# Patient Record
Sex: Female | Born: 1955
Health system: Southern US, Community
[De-identification: ages and names within clinical notes are randomized; demographics above are authoritative.]

## PROBLEM LIST (undated history)

## (undated) DIAGNOSIS — F32A Depression, unspecified: Secondary | ICD-10-CM

## (undated) DIAGNOSIS — K279 Peptic ulcer, site unspecified, unspecified as acute or chronic, without hemorrhage or perforation: Secondary | ICD-10-CM

## (undated) DIAGNOSIS — M21611 Bunion of right foot: Secondary | ICD-10-CM

## (undated) DIAGNOSIS — T7840XA Allergy, unspecified, initial encounter: Secondary | ICD-10-CM

## (undated) DIAGNOSIS — F419 Anxiety disorder, unspecified: Secondary | ICD-10-CM

## (undated) DIAGNOSIS — J45909 Unspecified asthma, uncomplicated: Secondary | ICD-10-CM

## (undated) DIAGNOSIS — E119 Type 2 diabetes mellitus without complications: Secondary | ICD-10-CM

## (undated) DIAGNOSIS — J189 Pneumonia, unspecified organism: Secondary | ICD-10-CM

## (undated) DIAGNOSIS — E785 Hyperlipidemia, unspecified: Secondary | ICD-10-CM

## (undated) DIAGNOSIS — G894 Chronic pain syndrome: Secondary | ICD-10-CM

## (undated) DIAGNOSIS — K219 Gastro-esophageal reflux disease without esophagitis: Secondary | ICD-10-CM

## (undated) DIAGNOSIS — M1990 Unspecified osteoarthritis, unspecified site: Secondary | ICD-10-CM

## (undated) DIAGNOSIS — I10 Essential (primary) hypertension: Secondary | ICD-10-CM

## (undated) DIAGNOSIS — M5412 Radiculopathy, cervical region: Secondary | ICD-10-CM

## (undated) HISTORY — PX: CERVICAL DISC SURGERY: SHX588

## (undated) HISTORY — DX: Chronic pain syndrome: G89.4

## (undated) HISTORY — PX: BREAST BIOPSY: SHX20

## (undated) HISTORY — DX: Radiculopathy, cervical region: M54.12

## (undated) HISTORY — DX: Peptic ulcer, site unspecified, unspecified as acute or chronic, without hemorrhage or perforation: K27.9

## (undated) HISTORY — PX: TUBAL LIGATION: SHX77

## (undated) HISTORY — DX: Hyperlipidemia, unspecified: E78.5

## (undated) HISTORY — DX: Anxiety disorder, unspecified: F41.9

## (undated) HISTORY — PX: HERNIA REPAIR: SHX51

## (undated) HISTORY — DX: Type 2 diabetes mellitus without complications: E11.9

## (undated) HISTORY — DX: Allergy, unspecified, initial encounter: T78.40XA

## (undated) HISTORY — DX: Essential (primary) hypertension: I10

## (undated) HISTORY — PX: BUNIONECTOMY: SHX129

## (undated) HISTORY — PX: COLONOSCOPY: SHX174

## (undated) HISTORY — DX: Depression, unspecified: F32.A

## (undated) HISTORY — PX: APPENDECTOMY: SHX54

## (undated) HISTORY — PX: CHOLECYSTECTOMY: SHX55

## (undated) HISTORY — DX: Gastro-esophageal reflux disease without esophagitis: K21.9

---

## 1978-05-17 HISTORY — PX: DIAGNOSTIC LAPAROSCOPY: SUR761

## 1997-12-24 ENCOUNTER — Other Ambulatory Visit: Admission: RE | Admit: 1997-12-24 | Discharge: 1997-12-24 | Payer: Self-pay | Admitting: Obstetrics & Gynecology

## 1998-01-15 ENCOUNTER — Ambulatory Visit (HOSPITAL_COMMUNITY): Admission: RE | Admit: 1998-01-15 | Discharge: 1998-01-15 | Payer: Self-pay | Admitting: General Surgery

## 1999-07-14 ENCOUNTER — Other Ambulatory Visit: Admission: RE | Admit: 1999-07-14 | Discharge: 1999-07-14 | Payer: Self-pay | Admitting: Obstetrics and Gynecology

## 1999-12-13 ENCOUNTER — Emergency Department (HOSPITAL_COMMUNITY): Admission: EM | Admit: 1999-12-13 | Discharge: 1999-12-13 | Payer: Self-pay | Admitting: Internal Medicine

## 1999-12-13 ENCOUNTER — Encounter: Payer: Self-pay | Admitting: Emergency Medicine

## 1999-12-14 ENCOUNTER — Encounter: Payer: Self-pay | Admitting: General Surgery

## 1999-12-15 ENCOUNTER — Ambulatory Visit (HOSPITAL_COMMUNITY): Admission: RE | Admit: 1999-12-15 | Discharge: 1999-12-16 | Payer: Self-pay | Admitting: General Surgery

## 2006-03-25 ENCOUNTER — Other Ambulatory Visit: Admission: RE | Admit: 2006-03-25 | Discharge: 2006-03-25 | Payer: Self-pay | Admitting: Family Medicine

## 2010-06-07 ENCOUNTER — Encounter: Payer: Self-pay | Admitting: Family Medicine

## 2015-04-03 ENCOUNTER — Other Ambulatory Visit: Payer: Self-pay | Admitting: Obstetrics & Gynecology

## 2015-04-03 DIAGNOSIS — N6489 Other specified disorders of breast: Secondary | ICD-10-CM

## 2015-04-15 ENCOUNTER — Other Ambulatory Visit: Payer: Self-pay | Admitting: Obstetrics & Gynecology

## 2015-04-15 ENCOUNTER — Ambulatory Visit
Admission: RE | Admit: 2015-04-15 | Discharge: 2015-04-15 | Disposition: A | Discharge status: Home / Self Care | Payer: Medicare Other | Source: Ambulatory Visit | Attending: Obstetrics & Gynecology | Admitting: Obstetrics & Gynecology

## 2015-04-15 DIAGNOSIS — N6489 Other specified disorders of breast: Secondary | ICD-10-CM

## 2015-04-15 DIAGNOSIS — N6320 Unspecified lump in the left breast, unspecified quadrant: Secondary | ICD-10-CM

## 2015-04-18 ENCOUNTER — Other Ambulatory Visit: Payer: Self-pay | Admitting: Obstetrics & Gynecology

## 2015-04-18 DIAGNOSIS — N6320 Unspecified lump in the left breast, unspecified quadrant: Secondary | ICD-10-CM

## 2015-04-21 ENCOUNTER — Ambulatory Visit
Admission: RE | Admit: 2015-04-21 | Discharge: 2015-04-21 | Disposition: A | Discharge status: Home / Self Care | Payer: Medicare Other | Source: Ambulatory Visit | Attending: Obstetrics & Gynecology | Admitting: Obstetrics & Gynecology

## 2015-04-21 DIAGNOSIS — N6320 Unspecified lump in the left breast, unspecified quadrant: Secondary | ICD-10-CM

## 2019-10-09 ENCOUNTER — Encounter: Payer: Self-pay | Admitting: Physician Assistant

## 2019-11-01 ENCOUNTER — Ambulatory Visit: Payer: Medicare Other | Admitting: Physician Assistant

## 2019-11-15 ENCOUNTER — Ambulatory Visit: Payer: Medicare Other | Admitting: Physician Assistant

## 2019-12-25 ENCOUNTER — Encounter: Payer: Self-pay | Admitting: Physician Assistant

## 2019-12-25 ENCOUNTER — Ambulatory Visit: Payer: Medicare Other | Admitting: Physician Assistant

## 2019-12-25 ENCOUNTER — Other Ambulatory Visit (INDEPENDENT_AMBULATORY_CARE_PROVIDER_SITE_OTHER): Payer: Medicare Other

## 2019-12-25 VITALS — BP 120/70 | HR 105 | Ht 66.0 in | Wt 235.0 lb

## 2019-12-25 DIAGNOSIS — R1013 Epigastric pain: Secondary | ICD-10-CM

## 2019-12-25 DIAGNOSIS — R197 Diarrhea, unspecified: Secondary | ICD-10-CM | POA: Diagnosis not present

## 2019-12-25 DIAGNOSIS — R1032 Left lower quadrant pain: Secondary | ICD-10-CM

## 2019-12-25 LAB — CBC WITH DIFFERENTIAL/PLATELET
Basophils Absolute: 0.1 10*3/uL (ref 0.0–0.1)
Basophils Relative: 1.3 % (ref 0.0–3.0)
Eosinophils Absolute: 0.2 10*3/uL (ref 0.0–0.7)
Eosinophils Relative: 1.9 % (ref 0.0–5.0)
HCT: 39.6 % (ref 36.0–46.0)
Hemoglobin: 13.2 g/dL (ref 12.0–15.0)
Lymphocytes Relative: 27 % (ref 12.0–46.0)
Lymphs Abs: 3.2 10*3/uL (ref 0.7–4.0)
MCHC: 33.3 g/dL (ref 30.0–36.0)
MCV: 87.1 fl (ref 78.0–100.0)
Monocytes Absolute: 0.6 10*3/uL (ref 0.1–1.0)
Monocytes Relative: 5 % (ref 3.0–12.0)
Neutro Abs: 7.7 10*3/uL (ref 1.4–7.7)
Neutrophils Relative %: 64.8 % (ref 43.0–77.0)
Platelets: 204 10*3/uL (ref 150.0–400.0)
RBC: 4.55 Mil/uL (ref 3.87–5.11)
RDW: 13.9 % (ref 11.5–15.5)
WBC: 11.9 10*3/uL — ABNORMAL HIGH (ref 4.0–10.5)

## 2019-12-25 LAB — COMPREHENSIVE METABOLIC PANEL
ALT: 37 U/L — ABNORMAL HIGH (ref 0–35)
AST: 28 U/L (ref 0–37)
Albumin: 4.4 g/dL (ref 3.5–5.2)
Alkaline Phosphatase: 92 U/L (ref 39–117)
BUN: 18 mg/dL (ref 6–23)
CO2: 30 mEq/L (ref 19–32)
Calcium: 9.6 mg/dL (ref 8.4–10.5)
Chloride: 101 mEq/L (ref 96–112)
Creatinine, Ser: 0.94 mg/dL (ref 0.40–1.20)
GFR: 59.72 mL/min — ABNORMAL LOW (ref >60.00)
Glucose, Bld: 141 mg/dL — ABNORMAL HIGH (ref 70–99)
Potassium: 3.8 mEq/L (ref 3.5–5.1)
Sodium: 139 mEq/L (ref 135–145)
Total Bilirubin: 0.6 mg/dL (ref 0.2–1.2)
Total Protein: 7.9 g/dL (ref 6.0–8.3)

## 2019-12-25 MED ORDER — ESOMEPRAZOLE MAGNESIUM 40 MG PO CPDR: 40.0000 mg | DELAYED_RELEASE_CAPSULE | Freq: Every morning | ORAL | 1 refills | Status: DC

## 2019-12-25 NOTE — Patient Instructions (Addendum)
Your provider has requested that you go to the basement level for lab work before leaving today. Press "B" on the elevator. The lab is located at the first door on the left as you exit the elevator.  Increase your Nexium to 40 mg daily. A new prescription has been sent to your pharmacy.   Change your Carafate timing to 1 hour before or 2 hours after eating or taking medications  You have been scheduled for a CT scan of the abdomen and pelvis at [facility], 1st floor Radiology. You are scheduled on 01/09/20  at 3pm. You should arrive 15 minutes prior to your appointment time for registration. Please follow the written instructions below on the day of your exam:    1) Do not eat anything after 11am (4 hours prior to your test)  2) At least 3 days prior to your procedure you will need to pick up 2 bottles of oral contrast to drink.  The solution may taste better if refrigerated, but do NOT add ice or any other liquid to this solution. Shake well before drinking.   Drink 1 bottle of contrast @ 1:00pm (2 hours prior to your exam)  Drink 1 bottle of contrast @ 2:00pm (1 hour prior to your exam)   You may take any medications as prescribed with a small amount of water, if necessary. If you take any of the following medications: METFORMIN, GLUCOPHAGE, GLUCOVANCE, AVANDAMET, RIOMET, FORTAMET, Antares MET, JANUMET, GLUMETZA or METAGLIP, you MAY be asked to HOLD this medication 48 hours AFTER the exam.   The purpose of you drinking the oral contrast is to aid in the visualization of your intestinal tract. The contrast solution may cause some diarrhea. Depending on your individual set of symptoms, you may also receive an intravenous injection of x-ray contrast/dye. Plan on being at [facility] for 45 minutes or longer, depending on the type of exam you are having performed.   If you have any questions regarding your exam or if you need to reschedule, you may call [facility] at [phone]  between the hours of 8:00 am and 5:00 pm, Monday-Friday.

## 2019-12-25 NOTE — Progress Notes (Signed)
Attending Physician's Attestation   I have reviewed the chart.   I agree with the Advanced Practitioner's note, impression, and recommendations with any updates as below. Agree with further work-up and imaging.  If imaging is unremarkable then endoscopic evaluation likely will be indicated.  If imaging is remarkable for diverticulitis then she will need retreatment and even more reason to plan a repeat endoscopic evaluation.     Justice Britain, MD [facility] Advanced Endoscopy Office # [phone]

## 2019-12-25 NOTE — Progress Notes (Signed)
Chief Complaint: Abdominal pain and diarrhea  HPI:    Amy Garner is a 64 year old Caucasian female with a past medical history as listed below including depression and reflux as well as diabetes type 2, who was referred to me by Timoteo Gaul, FNP for a complaint of abdominal pain and diarrhea.      10/08/2019 patient seen in clinic by her PCP.  At that time was found to have left lower quadrant tenderness and treated for suspected diverticulitis with ciprofloxacin 500 twice daily x7 days and Flagyl 500 twice daily x7 days.  Labs at that time showed a CMP with an elevated glucose of 272, AST elevated 53, ALT 63.  CBC was normal.    Today, the patient tells me that over the past 6 to 8 months she has had a left lower quadrant pain which seems slightly better after having a bowel movement and sometimes just "goes away".  But most of the time it is there all the time and seems to have been getting worse over the past couple of weeks.  Patient does not feel like it changed after using the antibiotics given in May.    Along with this patient describes a change toward looser bowel movements on a more regular basis.  This has also been over the past 6 to 8 months.  Tells me that sometimes it is "yellow and like water" and other times she will have a solid stool in between.  She kept a food journal and could not tell that anything was specifically bothering her.    Also describes some epigastric pain/discomfort which is minimally decreased after being started on Carafate 1 g 3 times daily and Nexium 20 mg once daily by her PCP over the past couple of months.  Denies any overt heartburn or reflux symptoms.    Describes a previous colonoscopy with [facility].  She believes this was in 2015.    Denies fever, chills, blood in her stool, weight loss or symptoms that awaken her from sleep.  Past Medical History:  Diagnosis Date  . Anxiety   . Cervical radiculopathy   . Chronic pain syndrome   . Depression     . GERD (gastroesophageal reflux disease)   . Hyperlipidemia   . Hypertension   . Peptic ulcer   . Type 2 diabetes mellitus ([place])     Past Surgical History:  Procedure Laterality Date  . BUNIONECTOMY    . CERVICAL DISC SURGERY    . CHOLECYSTECTOMY      Current Outpatient Medications  Medication Sig Dispense Refill  . albuterol (VENTOLIN HFA) 108 (90 Base) MCG/ACT inhaler Inhale 2 puffs into the lungs in the morning, at noon, and at bedtime.    . ALPRAZolam (XANAX) 0.5 MG tablet Take 0.5 mg by mouth in the morning, at noon, in the evening, and at bedtime.    . cholecalciferol (VITAMIN D3) 25 MCG (1000 UNIT) tablet Take 1,000 Units by mouth daily.    Marland Kitchen escitalopram (LEXAPRO) 20 MG tablet Take 20 mg by mouth daily.    Marland Kitchen glimepiride (AMARYL) 2 MG tablet Take 2 mg by mouth daily with breakfast.    . insulin detemir (LEVEMIR) 100 UNIT/ML injection Inject 45 Units into the skin daily.    Marland Kitchen losartan (COZAAR) 25 MG tablet Take 25 mg by mouth daily.    . Semaglutide,0.25 or 0.5MG /DOS, (OZEMPIC, 0.25 OR 0.5 MG/DOSE,) 2 MG/1.5ML SOPN Inject 1 mg into the skin once a week.    Marland Kitchen  triamterene-hydrochlorothiazide (DYAZIDE) 37.5-25 MG capsule Take 1 capsule by mouth daily.    Marland Kitchen esomeprazole (NEXIUM) 40 MG capsule Take 1 capsule (40 mg total) by mouth in the morning. 90 capsule 1   No current facility-administered medications for this visit.    Allergies as of 12/25/2019 - Review Complete 12/25/2019  Allergen Reaction Noted  . Atenolol Anaphylaxis 12/19/2019  . Codeine  12/19/2019    Family History  Problem Relation Age of Onset  . Heart attack Mother   . Asthma Mother   . Heart disease Father   . Stomach cancer Paternal Aunt   . Colon cancer Paternal Aunt     Social History   Socioeconomic History  . Marital status: Married    Spouse name: Not on file  . Number of children: Not on file  . Years of education: Not on file  . Highest education level: Not on file  Occupational History   . Not on file  Tobacco Use  . Smoking status: Former Research scientist (life sciences)  . Smokeless tobacco: Never Used  Vaping Use  . Vaping Use: Never used  Substance and Sexual Activity  . Alcohol use: Never  . Drug use: Never  . Sexual activity: Not on file  Other Topics Concern  . Not on file  Social History Narrative  . Not on file   Social Determinants of Health   Financial Resource Strain:   . Difficulty of Paying Living Expenses:   Food Insecurity:   . Worried About Charity fundraiser in the Last Year:   . Arboriculturist in the Last Year:   Transportation Needs:   . Film/video editor (Medical):   Marland Kitchen Lack of Transportation (Non-Medical):   Physical Activity:   . Days of Exercise per Week:   . Minutes of Exercise per Session:   Stress:   . Feeling of Stress :   Social Connections:   . Frequency of Communication with Friends and Family:   . Frequency of Social Gatherings with Friends and Family:   . Attends Religious Services:   . Active Member of Clubs or Organizations:   . Attends Archivist Meetings:   Marland Kitchen Marital Status:   Intimate Partner Violence:   . Fear of Current or Ex-Partner:   . Emotionally Abused:   Marland Kitchen Physically Abused:   . Sexually Abused:     Review of Systems:    Constitutional: No weight loss, fever or chills Skin: No rash  Cardiovascular: No chest pain Respiratory: No SOB  Gastrointestinal: See HPI and otherwise negative Genitourinary: No dysuria  Neurological: No headache, dizziness or syncope Musculoskeletal: No new muscle or joint pain Hematologic: No bleeding  Psychiatric: No history of depression or anxiety   Physical Exam:  Vital signs: BP 120/70   Pulse (!) 105   Ht 5\' 6"  (1.676 m)   Wt 235 lb (106.6 kg)   BMI 37.93 kg/m   Constitutional:   Pleasant overweight Caucasian female appears to be in NAD, Well developed, Well nourished, alert and cooperative Head:  Normocephalic and atraumatic. Eyes:   PEERL, EOMI. No icterus.  Conjunctiva pink. Ears:  Normal auditory acuity. Neck:  Supple Throat: Oral cavity and pharynx without inflammation, swelling or lesion.  Respiratory: Respirations even and unlabored. Lungs clear to auscultation bilaterally.   No wheezes, crackles, or rhonchi.  Cardiovascular: Normal S1, S2. No MRG. Regular rate and rhythm. No peripheral edema, cyanosis or pallor.  Gastrointestinal:  Soft, nondistended, marked left lower quadrant TTP  with involuntary guarding. Normal bowel sounds. No appreciable masses or hepatomegaly. Rectal:  Not performed.  Msk:  Symmetrical without gross deformities. Without edema, no deformity or joint abnormality.  Neurologic:  Alert and  oriented x4;  grossly normal neurologically.  Skin:   Dry and intact without significant lesions or rashes. Psychiatric:  Demonstrates good judgement and reason without abnormal affect or behaviors.  See HPI for recent labs.  Assessment: 1.  Epigastric pain: Some better with Nexium and Carafate; consider gastritis versus other 2.  Chronic left lower quadrant pain: Treated for diverticulitis by her PCP in May but no change in symptoms, better after a bowel movement; consider relationship to constipation versus continued diverticulitis versus other 3.  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  Repeat CBC and CMP today. 2.  Ordered a CT of the abdomen pelvis with contrast for further evaluation of continued left lower quadrant pain as well as epigastric pain.  Pending results patient may need EGD/colonoscopy. 3.  Increased patient's Nexium to 40 mg once daily, 30-60 minutes before breakfast.  She asked that this was sent to [facility].  Prescribe #90 with 1 refill. 4.  Discussed proper timing of using Carafate.  Told her this needs to be an hour before or 2 hours after eating and other medications. 5.  We will request records from [facility] in  regards to patient's last colonoscopy. 6.  Patient to schedule a follow-up with me in 4 to 6 weeks.  At that time we will discuss the CT and labs and decide if she needs to had endoscopic procedures.  Assigned to Dr. Rush Landmark today.  Ellouise Newer, PA-C [facility] 12/25/2019, 3:45 PM  Cc: Timoteo Gaul, FNP

## 2020-01-09 ENCOUNTER — Other Ambulatory Visit: Payer: Self-pay

## 2020-01-09 ENCOUNTER — Ambulatory Visit (HOSPITAL_COMMUNITY)
Admission: RE | Admit: 2020-01-09 | Discharge: 2020-01-09 | Disposition: A | Discharge status: Home / Self Care | Payer: Medicare Other | Source: Ambulatory Visit | Attending: Gastroenterology | Admitting: Gastroenterology

## 2020-01-09 DIAGNOSIS — K4090 Unilateral inguinal hernia, without obstruction or gangrene, not specified as recurrent: Secondary | ICD-10-CM | POA: Diagnosis not present

## 2020-01-09 DIAGNOSIS — I700 Atherosclerosis of aorta: Secondary | ICD-10-CM | POA: Diagnosis not present

## 2020-01-09 DIAGNOSIS — R197 Diarrhea, unspecified: Secondary | ICD-10-CM | POA: Diagnosis present

## 2020-01-09 DIAGNOSIS — K760 Fatty (change of) liver, not elsewhere classified: Secondary | ICD-10-CM | POA: Diagnosis not present

## 2020-01-09 MED ORDER — SODIUM CHLORIDE (PF) 0.9 % IJ SOLN
INTRAMUSCULAR | Status: AC
  Filled 2020-01-09: qty 50

## 2020-01-09 MED ORDER — IOHEXOL 300 MG/ML  SOLN
100.0000 mL | Freq: Once | INTRAMUSCULAR | Status: AC | PRN
  Administered 2020-01-09: 100 mL via INTRAVENOUS

## 2020-01-11 ENCOUNTER — Other Ambulatory Visit: Payer: Self-pay

## 2020-01-11 DIAGNOSIS — R7989 Other specified abnormal findings of blood chemistry: Secondary | ICD-10-CM

## 2020-01-15 ENCOUNTER — Other Ambulatory Visit: Payer: Self-pay

## 2020-01-15 DIAGNOSIS — R7989 Other specified abnormal findings of blood chemistry: Secondary | ICD-10-CM

## 2020-02-05 ENCOUNTER — Encounter: Payer: Self-pay | Admitting: Physician Assistant

## 2020-02-05 ENCOUNTER — Ambulatory Visit (INDEPENDENT_AMBULATORY_CARE_PROVIDER_SITE_OTHER): Payer: Medicare Other | Admitting: Physician Assistant

## 2020-02-05 VITALS — BP 124/72 | HR 84 | Ht 66.0 in | Wt 224.0 lb

## 2020-02-05 DIAGNOSIS — R109 Unspecified abdominal pain: Secondary | ICD-10-CM | POA: Diagnosis not present

## 2020-02-05 DIAGNOSIS — K4090 Unilateral inguinal hernia, without obstruction or gangrene, not specified as recurrent: Secondary | ICD-10-CM

## 2020-02-05 DIAGNOSIS — R1032 Left lower quadrant pain: Secondary | ICD-10-CM

## 2020-02-05 DIAGNOSIS — K219 Gastro-esophageal reflux disease without esophagitis: Secondary | ICD-10-CM | POA: Diagnosis not present

## 2020-02-05 NOTE — Progress Notes (Signed)
Attending Physician's Attestation   I have reviewed the chart.   I agree with the Advanced Practitioner's note, impression, and recommendations with any updates as below. I am glad to hear that she is doing better from the upper GI perspective.  I still think with her symptoms at some point in time anyone over the age of 57 who has new upper GI symptoms even if they respond to PPI therapy should undergo an endoscopic evaluation so we should keep that in the back of our minds but certainly can hold on that for now.  Agree colonoscopy should be considered although patient wants to wait on this and it seems based on the clinical examination that inguinal hernia seems to be more of an issue so certainly if the surgical team wants to move forward with conservative interventions to that first prior to colonoscopy we can hold on colonoscopy or we can certainly try to be available as necessary.  Endoscopic evaluation should not be held for ever but certainly based on patient's current acute issues can defer on them for a bit of time.   Justice Britain, MD [facility] Advanced Endoscopy Office # [phone]

## 2020-02-05 NOTE — Progress Notes (Signed)
Chief Complaint: Follow-up abdominal pain and diarrhea  HPI:    Amy Garner is a 64 year old Caucasian female with a past medical history as listed below including depression and reflux as well as type 2 diabetes, assigned to Dr. Rush Landmark at last visit, who presents clinic today for follow-up of abdominal pain and diarrhea.    10/08/2019 patient seen by PCP was suspected diverticulitis and treated with Cipro and Flagyl x7 days.  Also had elevated liver enzymes at that time including AST 53 and ALT is 63.    12/25/2019 patient saw me in clinic and described continuing with left lower quadrant pain over the past 6 to 8 months.  Also describes a change toward looser bowel movements on a more regular basis.  Also discussed the epigastric pain which is minimally decreased after being started on Carafate and Nexium.  At that time ordered a repeat CBC and CMP as well as a CT of the abdomen pelvis.  Discussed that if she continues with lower quadrant pain and epigastric pain then would need an EGD/colonoscopy.  Increase Nexium to 40 mg once daily.  Also discussed proper timing of Carafate.    12/25/2019 ALT is 37.    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  CT was reviewed by Dr. Rush Landmark and patient was sent a referral to urology as well as referral to surgery to be worked up for inguinal hernia.  Repeat LFTs recommended in 1 month.    Today, the patient tells me that her epigastric pain is almost completely relieved with the increase of Nexium to 40 mg once daily.  Occasionally she has to use the Pepcid on top of this for some reflux symptoms.    Continues with a chronic left lower quadrant abdominal pain which is worse when she  is standing for a long time.  She now believes this is related to the hernia.  Apparently she had a right inguinal hernia repaired years ago.  She has not received referral to [facility] yet.    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    Denies fever, chills, weight loss or symptoms that awaken her from sleep.  Past Medical History:  Diagnosis Date  . Anxiety   . Cervical radiculopathy   . Chronic pain syndrome   . Depression   . GERD (gastroesophageal reflux disease)   . Hyperlipidemia   . Hypertension   . Peptic ulcer   . Type 2 diabetes mellitus ([place])     Past Surgical History:  Procedure Laterality Date  . BUNIONECTOMY    . CERVICAL DISC SURGERY    . CHOLECYSTECTOMY      Current Outpatient Medications  Medication Sig Dispense Refill  . albuterol (VENTOLIN HFA) 108 (90 Base) MCG/ACT inhaler Inhale 2 puffs into the lungs in the morning, at noon, and at bedtime.    . ALPRAZolam (XANAX) 0.5 MG tablet Take 0.5 mg by mouth in the morning, at noon, in the evening, and at bedtime.    . cholecalciferol (VITAMIN D3) 25 MCG (1000 UNIT) tablet Take 1,000 Units by mouth daily.    Marland Kitchen escitalopram (LEXAPRO) 20 MG tablet Take 20 mg by mouth daily.    Marland Kitchen  esomeprazole (NEXIUM) 40 MG capsule Take 1 capsule (40 mg total) by mouth in the morning. 90 capsule 1  . glimepiride (AMARYL) 2 MG tablet Take 2 mg by mouth daily with breakfast.    . insulin detemir (LEVEMIR) 100 UNIT/ML injection Inject 45 Units into the skin daily.    Marland Kitchen losartan (COZAAR) 25 MG tablet Take 25 mg by mouth daily.    . Semaglutide,0.25 or 0.5MG /DOS, (OZEMPIC, 0.25 OR 0.5 MG/DOSE,) 2 MG/1.5ML SOPN Inject 1 mg into the skin once a week.    . triamterene-hydrochlorothiazide (DYAZIDE) 37.5-25 MG capsule Take 1 capsule by mouth daily.     No current facility-administered medications for this visit.    Allergies as of 02/05/2020 -  Review Complete 12/25/2019  Allergen Reaction Noted  . Atenolol Anaphylaxis 12/19/2019  . Codeine  12/19/2019    Family History  Problem Relation Age of Onset  . Heart attack Mother   . Asthma Mother   . Heart disease Father   . Stomach cancer Paternal Aunt   . Colon cancer Paternal Aunt     Social History   Socioeconomic History  . Marital status: Married    Spouse name: Not on file  . Number of children: Not on file  . Years of education: Not on file  . Highest education level: Not on file  Occupational History  . Not on file  Tobacco Use  . Smoking status: Former Research scientist (life sciences)  . Smokeless tobacco: Never Used  Vaping Use  . Vaping Use: Never used  Substance and Sexual Activity  . Alcohol use: Never  . Drug use: Never  . Sexual activity: Not on file  Other Topics Concern  . Not on file  Social History Narrative  . Not on file   Social Determinants of Health   Financial Resource Strain:   . Difficulty of Paying Living Expenses: Not on file  Food Insecurity:   . Worried About Charity fundraiser in the Last Year: Not on file  . Ran Out of Food in the Last Year: Not on file  Transportation Needs:   . Lack of Transportation (Medical): Not on file  . Lack of Transportation (Non-Medical): Not on file  Physical Activity:   . Days of Exercise per Week: Not on file  . Minutes of Exercise per Session: Not on file  Stress:   . Feeling of Stress : Not on file  Social Connections:   . Frequency of Communication with Friends and Family: Not on file  . Frequency of Social Gatherings with Friends and Family: Not on file  . Attends Religious Services: Not on file  . Active Member of Clubs or Organizations: Not on file  . Attends Archivist Meetings: Not on file  . Marital Status: Not on file  Intimate Partner Violence:   . Fear of Current or Ex-Partner: Not on file  . Emotionally Abused: Not on file  . Physically Abused: Not on file  . Sexually Abused: Not on file     Review of Systems:    Constitutional: No weight loss, fever or chills Cardiovascular: No chest pain Respiratory: No SOB  Gastrointestinal: See HPI and otherwise negative   Physical Exam:  Vital signs: BP 124/72   Pulse 84   Ht 5\' 6"  (1.676 m)   Wt 224 lb (101.6 kg)   BMI 36.15 kg/m   Constitutional:   Pleasant overweight Caucasian female appears to be in NAD, Well developed, Well nourished, alert and cooperative Respiratory:  Respirations even and unlabored. Lungs clear to auscultation bilaterally.   No wheezes, crackles, or rhonchi.  Cardiovascular: Normal S1, S2. No MRG. Regular rate and rhythm. No peripheral edema, cyanosis or pallor.  Gastrointestinal:  Soft, nondistended, moderate LLQ ttp. No rebound or guarding. Normal bowel sounds. No appreciable masses or hepatomegaly. Rectal:  Not performed.  Psychiatric: Demonstrates good judgement and reason without abnormal affect or behaviors.  RELEVANT LABS AND IMAGING: CBC    Component Value Date/Time   WBC 11.9 (H) 12/25/2019 1559   RBC 4.55 12/25/2019 1559   HGB 13.2 12/25/2019 1559   HCT 39.6 12/25/2019 1559   PLT 204.0 12/25/2019 1559   MCV 87.1 12/25/2019 1559   MCHC 33.3 12/25/2019 1559   RDW 13.9 12/25/2019 1559   LYMPHSABS 3.2 12/25/2019 1559   MONOABS 0.6 12/25/2019 1559   EOSABS 0.2 12/25/2019 1559   BASOSABS 0.1 12/25/2019 1559    CMP     Component Value Date/Time   NA 139 12/25/2019 1559   K 3.8 12/25/2019 1559   CL 101 12/25/2019 1559   CO2 30 12/25/2019 1559   GLUCOSE 141 (H) 12/25/2019 1559   BUN 18 12/25/2019 1559   CREATININE 0.94 12/25/2019 1559   CALCIUM 9.6 12/25/2019 1559   PROT 7.9 12/25/2019 1559   ALBUMIN 4.4 12/25/2019 1559   AST 28 12/25/2019 1559   ALT 37 (H) 12/25/2019 1559   ALKPHOS 92 12/25/2019 1559   BILITOT 0.6 12/25/2019 1559    Assessment: 1.  Chronic left lower quadrant pain: Likely related to inguinal hernia seen on CT 2.  GERD/epigastric pain: Much better with Nexium  40 mg daily; likely related to gastritis 3.  Elevated LFTs: ALT has remained minimally elevated, will recheck today  Plan: 1.  Referred patient to [facility].  Discussed that likely they will want to intervene soon as she is now having pain in this area. 2.  Patient would like to hold off on EGD and colonoscopy for now.  This is reasonable given that her symptoms are much better and left lower quadrant pain is explained by inguinal hernia. 3.  Continue Nexium 40 mg daily 4.  Patient is already scheduled for repeat LFTs today.  If ALT is still elevated will add other labs per Dr. Donneta Romberg recommendations. 5.  Patient to follow in clinic with [place] as needed after seeing surgical team.  Ellouise Newer, PA-C [facility] 02/05/2020, 11:19 AM  Cc: Timoteo Gaul, FNP

## 2020-02-05 NOTE — Patient Instructions (Signed)
Referral has been sent to  [facility]. Please contact their office if you have not heard from them in 1 week.  Make certain to bring a list of current medications, including any over the counter medications or vitamins. Also bring your co-pay if you have one as well as your insurance cards. [facility] is located at [address]. Should you need to reschedule your appointment, please contact them at [phone].    If you are age 25 or older, your body mass index should be between 23-30. Your Body mass index is 36.15 kg/m. If this is out of the aforementioned range listed, please consider follow up with your Primary Care Provider.  If you are age 38 or younger, your body mass index should be between 19-25. Your Body mass index is 36.15 kg/m. If this is out of the aformentioned range listed, please consider follow up with your Primary Care Provider.    Thank you for choosing me and [facility].  Dennison Bulla

## 2020-03-27 ENCOUNTER — Ambulatory Visit: Payer: Self-pay | Admitting: Surgery

## 2020-03-27 NOTE — H&P (Signed)
Surgical Evaluation   HPI: this is a very pleasant 64 year old woman who presents with a left inguinal hernia when she first noticed in May.  She experiences burning pain and noted a bulge in the area.  This is reducible.  Denies associated GI symptoms.  She had a right inguinal hernia repair by Dr. Rebekah Chesterfield many years ago, denies any other abdominal surgery.   Allergies  Allergen Reactions  . Atenolol Anaphylaxis  . Codeine     Past Medical History:  Diagnosis Date  . Anxiety   . Cervical radiculopathy   . Chronic pain syndrome   . Depression   . GERD (gastroesophageal reflux disease)   . Hyperlipidemia   . Hypertension   . Peptic ulcer   . Type 2 diabetes mellitus ([place])     Past Surgical History:  Procedure Laterality Date  . BUNIONECTOMY    . CERVICAL DISC SURGERY    . CHOLECYSTECTOMY      Family History  Problem Relation Age of Onset  . Heart attack Mother   . Asthma Mother   . Heart disease Father   . Stomach cancer Paternal Aunt   . Colon cancer Paternal Aunt     Social History   Socioeconomic History  . Marital status: Married    Spouse name: Not on file  . Number of children: Not on file  . Years of education: Not on file  . Highest education level: Not on file  Occupational History  . Not on file  Tobacco Use  . Smoking status: Former Research scientist (life sciences)  . Smokeless tobacco: Never Used  Vaping Use  . Vaping Use: Never used  Substance and Sexual Activity  . Alcohol use: Never  . Drug use: Never  . Sexual activity: Not on file  Other Topics Concern  . Not on file  Social History Narrative  . Not on file   Social Determinants of Health   Financial Resource Strain:   . Difficulty of Paying Living Expenses: Not on file  Food Insecurity:   . Worried About Charity fundraiser in the Last Year: Not on file  . Ran Out of Food in the Last Year: Not on file  Transportation Needs:   . Lack of Transportation (Medical): Not on file  . Lack of Transportation  (Non-Medical): Not on file  Physical Activity:   . Days of Exercise per Week: Not on file  . Minutes of Exercise per Session: Not on file  Stress:   . Feeling of Stress : Not on file  Social Connections:   . Frequency of Communication with Friends and Family: Not on file  . Frequency of Social Gatherings with Friends and Family: Not on file  . Attends Religious Services: Not on file  . Active Member of Clubs or Organizations: Not on file  . Attends Archivist Meetings: Not on file  . Marital Status: Not on file    Current Outpatient Medications on File Prior to Visit  Medication Sig Dispense Refill  . albuterol (VENTOLIN HFA) 108 (90 Base) MCG/ACT inhaler Inhale 2 puffs into the lungs in the morning, at noon, and at bedtime.    . ALPRAZolam (XANAX) 0.5 MG tablet Take 0.5 mg by mouth in the morning, at noon, in the evening, and at bedtime.    . cholecalciferol (VITAMIN D3) 25 MCG (1000 UNIT) tablet Take 1,000 Units by mouth daily.    Marland Kitchen escitalopram (LEXAPRO) 20 MG tablet Take 20 mg by mouth daily.    Marland Kitchen  esomeprazole (NEXIUM) 40 MG capsule Take 1 capsule (40 mg total) by mouth in the morning. 90 capsule 1  . glimepiride (AMARYL) 2 MG tablet Take 2 mg by mouth daily with breakfast.    . insulin detemir (LEVEMIR) 100 UNIT/ML injection Inject 45 Units into the skin daily.    Marland Kitchen losartan (COZAAR) 25 MG tablet Take 25 mg by mouth daily.    . Semaglutide,0.25 or 0.5MG /DOS, (OZEMPIC, 0.25 OR 0.5 MG/DOSE,) 2 MG/1.5ML SOPN Inject 1 mg into the skin once a week.    . triamterene-hydrochlorothiazide (DYAZIDE) 37.5-25 MG capsule Take 1 capsule by mouth daily.     No current facility-administered medications on file prior to visit.    Review of Systems: a complete, 10pt review of systems was completed with pertinent positives and negatives as documented in the HPI  Physical Exam: Vitals  Weight: 229 lb Height: 66in Body Surface Area: 2.12 m Body Mass Index: 36.96 kg/m  Temp.:  98.37F  Pulse: 85 (Regular)  P.OX: 97% (Room air) BP: 124/76(Sitting, Left Arm, Standard)  Alert well-appearing, unlabored respirations Abdomen is obese, nontender. There is reducible left hernia, no palpable hernia on the right, well-healed scar   CBC Latest Ref Rng & Units 12/25/2019  WBC 4.0 - 10.5 K/uL 11.9(H)  Hemoglobin 12.0 - 15.0 g/dL 13.2  Hematocrit 36 - 46 % 39.6  Platelets 150 - 400 K/uL 204.0    CMP Latest Ref Rng & Units 12/25/2019  Glucose 70 - 99 mg/dL 141(H)  BUN 6 - 23 mg/dL 18  Creatinine 0.40 - 1.20 mg/dL 0.94  Sodium 135 - 145 mEq/L 139  Potassium 3.5 - 5.1 mEq/L 3.8  Chloride 96 - 112 mEq/L 101  CO2 19 - 32 mEq/L 30  Calcium 8.4 - 10.5 mg/dL 9.6  Total Protein 6.0 - 8.3 g/dL 7.9  Total Bilirubin 0.2 - 1.2 mg/dL 0.6  Alkaline Phos 39 - 117 U/L 92  AST 0 - 37 U/L 28  ALT 0 - 35 U/L 37(H)    No results found for: INR, PROTIME  Imaging: No results found.   A/P: LEFT INGUINAL HERNIA (K40.90) Story: We discussed the relevant anatomy and we discussed the technique of the procedure, going over both the open and laparoscopic/robotic approach. Discussed risks of bleeding, infection, pain, scarring, injury to structures in the area including nerves, blood vessels, bowel, bladder, risk of chronic pain, hernia recurrence, risk of seroma or hematoma, urinary retention, and risks of general anesthesia including cardiovascular, pulmonary, and thromboembolic complications. Questions were answered. In this unilateral nonrecurrent hernia I recommend an open approach and she wishes for the same. We will schedule at her convenience.    There are no problems to display for this patient.      Romana Juniper, MD [facility] Surgery, [place]  See AMION to contact appropriate on-call provider

## 2020-04-24 NOTE — Patient Instructions (Addendum)
DUE TO COVID-19 ONLY ONE VISITOR IS ALLOWED TO COME WITH YOU AND STAY IN THE WAITING ROOM ONLY DURING PRE OP AND PROCEDURE DAY OF SURGERY. THE 1 VISITOR  MAY VISIT WITH YOU AFTER SURGERY IN YOUR PRIVATE ROOM DURING VISITING HOURS ONLY!  YOU NEED TO HAVE A COVID 19 TEST ON__12/14_____ @_9 :00______, THIS TEST MUST BE DONE BEFORE SURGERY,  COVID TESTING SITE [place] [address], IT IS ON THE RIGHT GOING OUT [address] APPROXIMATELY  2 MINUTES PAST [employer] ON THE RIGHT. ONCE YOUR COVID TEST IS COMPLETED,  PLEASE BEGIN THE QUARANTINE INSTRUCTIONS AS OUTLINED IN YOUR HANDOUT.                Amy Garner    Your procedure is scheduled on: 05/02/20   Report to [facility] Main  Entrance   Report to admitting at 8:00 AM     Call this number if you have problems the morning of surgery [phone]    Remember: Do not eat food or drink liquids :After Midnight.   BRUSH YOUR TEETH MORNING OF SURGERY AND RINSE YOUR MOUTH OUT, NO CHEWING GUM CANDY OR MINTS.     Take these medicines the morning of surgery with A SIP OF WATER: Lexapro, Nexium,  use your inhaler and bring it with you      How to Manage Your Diabetes Before and After Surgery  Why is it important to control my blood sugar before and after surgery? . Improving blood sugar levels before and after surgery helps healing and can limit problems. . A way of improving blood sugar control is eating a healthy diet by: o  Eating less sugar and carbohydrates o  Increasing activity/exercise o  Talking with your doctor about reaching your blood sugar goals . High blood sugars (greater than 180 mg/dL) can raise your risk of infections and slow your recovery, so you will need to focus on controlling your diabetes during the weeks before surgery. . Make sure that the doctor who takes care of your diabetes knows about your planned surgery including the date and location.  How do I manage my  blood sugar before surgery? . Check your blood sugar at least 4 times a day, starting 2 days before surgery, to make sure that the level is not too high or low. o Check your blood sugar the morning of your surgery when you wake up and every 2 hours until you get to the Short Stay unit. . If your blood sugar is less than 70 mg/dL, you will need to treat for low blood sugar: o Do not take insulin. o Treat a low blood sugar (less than 70 mg/dL) with  cup of clear juice (cranberry or apple), 4 glucose tablets, OR glucose gel. o Recheck blood sugar in 15 minutes after treatment (to make sure it is greater than 70 mg/dL). If your blood sugar is not greater than 70 mg/dL on recheck, call [phone] for further instructions. . Report your blood sugar to the short stay nurse when you get to Short Stay.  . If you are admitted to the hospital after surgery: o Your blood sugar will be checked by the staff and you will probably be given insulin after surgery (instead of oral diabetes medicines) to make sure you have good blood sugar levels. o The goal for blood sugar control after surgery is 80-180 mg/dL.   WHAT DO I DO ABOUT MY DIABETES MEDICATION?  Marland Kitchen Do  not take oral diabetes medicines (pills) the morning of surgery.  . THE NIGHT BEFORE SURGERY, take   0  units of       insulin.       . THE MORNING OF SURGERY, take  22 units of   insulin.  . The day of surgery, do not take other diabetes injectables, including Byetta (exenatide), Bydureon (exenatide ER), Victoza (liraglutide), or Trulicity (dulaglutide).                        You may not have any metal on your body including hair pins and              piercings  Do not wear jewelry, make-up, lotions, powders or perfumes, deodorant             Do not wear nail polish on your fingernails.  Do not shave  48 hours prior to surgery.            .   Do not bring valuables to the hospital. [place].  Contacts, dentures or bridgework may not be worn into surgery.       Patients discharged the day of surgery will not be allowed to drive home.   IF YOU ARE HAVING SURGERY AND GOING HOME THE SAME DAY, YOU MUST HAVE AN ADULT TO DRIVE YOU HOME AND BE WITH YOU FOR 24 HOURS.   YOU MAY GO HOME BY TAXI OR UBER OR ORTHERWISE, BUT AN ADULT MUST ACCOMPANY YOU HOME AND STAY WITH YOU FOR 24 HOURS.  Name and phone number of your driver:  Special Instructions: N/A              Please read over the following fact sheets you were given: _____________________________________________________________________             [facility] - Preparing for Surgery Before surgery, you can play an important role.   Because skin is not sterile, your skin needs to be as free of germs as possible .  You can reduce the number of germs on your skin by washing with CHG (chlorahexidine gluconate) soap before surgery.   CHG is an antiseptic cleaner which kills germs and bonds with the skin to continue killing germs even after washing. Please DO NOT use if you have an allergy to CHG or antibacterial soaps.   If your skin becomes reddened/irritated stop using the CHG and inform your nurse when you arrive at Short Stay. Do not shave (including legs and underarms) for at least 48 hours prior to the first CHG shower.  Please follow these instructions carefully:  1.  Shower with CHG Soap the night before surgery and the  morning of Surgery.  2.  If you choose to wash your hair, wash your hair first as usual with your  normal  shampoo.  3.  After you shampoo, rinse your hair and body thoroughly to remove the  shampoo.                                        4.  Use CHG as you would any other liquid soap.  You can apply chg directly  to the skin and wash  Gently with a scrungie or clean washcloth.  5.  Apply the CHG Soap to your body ONLY FROM THE NECK DOWN.   Do not use on face/ open                            Wound or open sores. Avoid contact with eyes, ears mouth and genitals (private parts).                       Wash face,  Genitals (private parts) with your normal soap.             6.  Wash thoroughly, paying special attention to the area where your surgery  will be performed.  7.  Thoroughly rinse your body with warm water from the neck down.  8.  DO NOT shower/wash with your normal soap after using and rinsing off  the CHG Soap.             9.  Pat yourself dry with a clean towel.            10.  Wear clean pajamas.            11.  Place clean sheets on your bed the night of your first shower and do not  sleep with pets. Day of Surgery : Do not apply any lotions/deodorants the morning of surgery.  Please wear clean clothes to the hospital/surgery center.  FAILURE TO FOLLOW THESE INSTRUCTIONS MAY RESULT IN THE CANCELLATION OF YOUR SURGERY PATIENT SIGNATURE_________________________________  NURSE SIGNATURE__________________________________  ________________________________________________________________________

## 2020-04-25 ENCOUNTER — Encounter (HOSPITAL_COMMUNITY)
Admission: RE | Admit: 2020-04-25 | Discharge: 2020-04-25 | Disposition: A | Discharge status: Home / Self Care | Payer: Medicare Other | Source: Ambulatory Visit | Attending: Surgery | Admitting: Surgery

## 2020-04-25 ENCOUNTER — Other Ambulatory Visit: Payer: Self-pay

## 2020-04-25 ENCOUNTER — Encounter (HOSPITAL_COMMUNITY): Payer: Self-pay

## 2020-04-25 DIAGNOSIS — Z01818 Encounter for other preprocedural examination: Secondary | ICD-10-CM | POA: Diagnosis present

## 2020-04-25 HISTORY — DX: Unspecified asthma, uncomplicated: J45.909

## 2020-04-25 LAB — BASIC METABOLIC PANEL
Anion gap: 5 (ref 5–15)
BUN: 22 mg/dL (ref 8–23)
CO2: 34 mmol/L — ABNORMAL HIGH (ref 22–32)
Calcium: 9.7 mg/dL (ref 8.9–10.3)
Chloride: 99 mmol/L (ref 98–111)
Creatinine, Ser: 0.74 mg/dL (ref 0.44–1.00)
GFR, Estimated: >60 mL/min (ref >60)
Glucose, Bld: 117 mg/dL — ABNORMAL HIGH (ref 70–99)
Potassium: 4.7 mmol/L (ref 3.5–5.1)
Sodium: 138 mmol/L (ref 135–145)

## 2020-04-25 LAB — CBC
HCT: 40.3 % (ref 36.0–46.0)
Hemoglobin: 12.9 g/dL (ref 12.0–15.0)
MCH: 28.7 pg (ref 26.0–34.0)
MCHC: 32 g/dL (ref 30.0–36.0)
MCV: 89.6 fL (ref 80.0–100.0)
Platelets: 198 10*3/uL (ref 150–400)
RBC: 4.5 MIL/uL (ref 3.87–5.11)
RDW: 13.2 % (ref 11.5–15.5)
WBC: 9.7 10*3/uL (ref 4.0–10.5)
nRBC: 0 % (ref 0.0–0.2)

## 2020-04-25 LAB — HEMOGLOBIN A1C
Hgb A1c MFr Bld: 6.4 % — ABNORMAL HIGH (ref 4.8–5.6)
Mean Plasma Glucose: 136.98 mg/dL

## 2020-04-25 LAB — GLUCOSE, CAPILLARY: Glucose-Capillary: 104 mg/dL — ABNORMAL HIGH (ref 70–99)

## 2020-04-25 NOTE — Progress Notes (Addendum)
COVID Vaccine Completed:Yes Date COVID Vaccine completed:01/15/20 Booster- 03/19/20 COVID vaccine manufacturer: [place]       PCP - Nile Riggs FNP Cardiologist - none  Chest x-ray - no EKG - 04/25/20-chart and Epic Stress Test - no ECHO - no Cardiac Cath - no Pacemaker/ICD device last checked:NA  Sleep Study - no CPAP -   Fasting Blood Sugar - 112-121 Checks Blood Sugar _QD____ times a day  Blood Thinner Instructions:NA Aspirin Instructions: Last Dose:  Anesthesia review:   Patient denies shortness of breath, fever, cough and chest pain at PAT appointment yes   Patient verbalized understanding of instructions that were given to them at the PAT appointment. Patient was also instructed that they will need to review over the PAT instructions again at home before surgery. Yes Pt gets SOB when she is ill but usually doesn't need her inhaler. She has no SOB doing housework or with ADLs Pt is unable to extend her neck

## 2020-04-29 ENCOUNTER — Other Ambulatory Visit (HOSPITAL_COMMUNITY)
Admission: RE | Admit: 2020-04-29 | Discharge: 2020-04-29 | Disposition: A | Discharge status: Home / Self Care | Payer: Medicare Other | Source: Ambulatory Visit | Attending: Surgery | Admitting: Surgery

## 2020-04-29 DIAGNOSIS — Z20822 Contact with and (suspected) exposure to covid-19: Secondary | ICD-10-CM | POA: Diagnosis not present

## 2020-04-29 DIAGNOSIS — Z01812 Encounter for preprocedural laboratory examination: Secondary | ICD-10-CM | POA: Insufficient documentation

## 2020-04-29 LAB — SARS CORONAVIRUS 2 (TAT 6-24 HRS): SARS Coronavirus 2: NEGATIVE

## 2020-05-01 MED ORDER — BUPIVACAINE LIPOSOME 1.3 % IJ SUSP
20.0000 mL | INTRAMUSCULAR | Status: DC
  Filled 2020-05-01: qty 20

## 2020-05-02 ENCOUNTER — Other Ambulatory Visit (HOSPITAL_COMMUNITY): Payer: Self-pay | Admitting: Surgery

## 2020-05-02 ENCOUNTER — Ambulatory Visit (HOSPITAL_COMMUNITY)
Admission: RE | Admit: 2020-05-02 | Discharge: 2020-05-02 | Disposition: A | Discharge status: Home / Self Care | Payer: Medicare Other | Source: Other Acute Inpatient Hospital | Attending: Surgery | Admitting: Surgery

## 2020-05-02 ENCOUNTER — Encounter (HOSPITAL_COMMUNITY)
Admission: RE | Disposition: A | Discharge status: Home / Self Care | Payer: Self-pay | Source: Other Acute Inpatient Hospital | Attending: Surgery

## 2020-05-02 ENCOUNTER — Telehealth (HOSPITAL_COMMUNITY): Payer: Self-pay | Admitting: *Deleted

## 2020-05-02 ENCOUNTER — Ambulatory Visit (HOSPITAL_COMMUNITY): Payer: Medicare Other | Admitting: Certified Registered"

## 2020-05-02 ENCOUNTER — Ambulatory Visit (HOSPITAL_COMMUNITY): Payer: Medicare Other | Admitting: Physician Assistant

## 2020-05-02 ENCOUNTER — Encounter (HOSPITAL_COMMUNITY): Payer: Self-pay | Admitting: Surgery

## 2020-05-02 DIAGNOSIS — Z79899 Other long term (current) drug therapy: Secondary | ICD-10-CM | POA: Insufficient documentation

## 2020-05-02 DIAGNOSIS — Z87891 Personal history of nicotine dependence: Secondary | ICD-10-CM | POA: Diagnosis not present

## 2020-05-02 DIAGNOSIS — D171 Benign lipomatous neoplasm of skin and subcutaneous tissue of trunk: Secondary | ICD-10-CM | POA: Insufficient documentation

## 2020-05-02 DIAGNOSIS — Z7984 Long term (current) use of oral hypoglycemic drugs: Secondary | ICD-10-CM | POA: Insufficient documentation

## 2020-05-02 DIAGNOSIS — K4090 Unilateral inguinal hernia, without obstruction or gangrene, not specified as recurrent: Secondary | ICD-10-CM | POA: Insufficient documentation

## 2020-05-02 DIAGNOSIS — Z794 Long term (current) use of insulin: Secondary | ICD-10-CM | POA: Insufficient documentation

## 2020-05-02 HISTORY — PX: INGUINAL HERNIA REPAIR: SHX194

## 2020-05-02 LAB — GLUCOSE, CAPILLARY
Glucose-Capillary: 133 mg/dL — ABNORMAL HIGH (ref 70–99)
Glucose-Capillary: 151 mg/dL — ABNORMAL HIGH (ref 70–99)

## 2020-05-02 SURGERY — REPAIR, HERNIA, INGUINAL, ADULT
Anesthesia: General | Laterality: Left

## 2020-05-02 MED ORDER — ONDANSETRON HCL 4 MG/2ML IJ SOLN
INTRAMUSCULAR | Status: AC
  Filled 2020-05-02: qty 2

## 2020-05-02 MED ORDER — SODIUM CHLORIDE 0.9% FLUSH: 3.0000 mL | Freq: Two times a day (BID) | INTRAVENOUS | Status: DC

## 2020-05-02 MED ORDER — BUPIVACAINE-EPINEPHRINE 0.25% -1:200000 IJ SOLN
INTRAMUSCULAR | Status: DC | PRN
  Administered 2020-05-02: 20 mL

## 2020-05-02 MED ORDER — FENTANYL CITRATE (PF) 100 MCG/2ML IJ SOLN
INTRAMUSCULAR | Status: AC
  Administered 2020-05-02: 11:00:00 50 ug via INTRAVENOUS
  Filled 2020-05-02: qty 2

## 2020-05-02 MED ORDER — CEFAZOLIN SODIUM-DEXTROSE 2-4 GM/100ML-% IV SOLN
2.0000 g | INTRAVENOUS | Status: AC
  Administered 2020-05-02: 09:00:00 2 g via INTRAVENOUS
  Filled 2020-05-02: qty 100

## 2020-05-02 MED ORDER — BUPIVACAINE LIPOSOME 1.3 % IJ SUSP
INTRAMUSCULAR | Status: DC | PRN
  Administered 2020-05-02: 20 mL

## 2020-05-02 MED ORDER — ONDANSETRON HCL 4 MG/2ML IJ SOLN: 4.0000 mg | Freq: Once | INTRAMUSCULAR | Status: DC | PRN

## 2020-05-02 MED ORDER — ACETAMINOPHEN 650 MG RE SUPP
650.0000 mg | RECTAL | Status: DC | PRN
  Filled 2020-05-02: qty 1

## 2020-05-02 MED ORDER — CHLORHEXIDINE GLUCONATE 4 % EX LIQD: 60.0000 mL | Freq: Once | CUTANEOUS | Status: DC

## 2020-05-02 MED ORDER — 0.9 % SODIUM CHLORIDE (POUR BTL) OPTIME
TOPICAL | Status: DC | PRN
  Administered 2020-05-02: 09:00:00 1000 mL

## 2020-05-02 MED ORDER — FENTANYL CITRATE (PF) 100 MCG/2ML IJ SOLN
INTRAMUSCULAR | Status: AC
  Filled 2020-05-02: qty 2

## 2020-05-02 MED ORDER — DOCUSATE SODIUM 100 MG PO CAPS: 100.0000 mg | ORAL_CAPSULE | Freq: Two times a day (BID) | ORAL | 0 refills | Status: AC

## 2020-05-02 MED ORDER — MIDAZOLAM HCL 2 MG/2ML IJ SOLN
INTRAMUSCULAR | Status: AC
  Filled 2020-05-02: qty 2

## 2020-05-02 MED ORDER — SODIUM CHLORIDE 0.9 % IV SOLN: 250.0000 mL | INTRAVENOUS | Status: DC | PRN

## 2020-05-02 MED ORDER — LACTATED RINGERS IV SOLN: INTRAVENOUS | Status: DC

## 2020-05-02 MED ORDER — PROPOFOL 10 MG/ML IV BOLUS
INTRAVENOUS | Status: DC | PRN
  Administered 2020-05-02: 200 mg via INTRAVENOUS

## 2020-05-02 MED ORDER — BUPIVACAINE-EPINEPHRINE (PF) 0.25% -1:200000 IJ SOLN
INTRAMUSCULAR | Status: AC
  Filled 2020-05-02: qty 30

## 2020-05-02 MED ORDER — FENTANYL CITRATE (PF) 100 MCG/2ML IJ SOLN
INTRAMUSCULAR | Status: DC | PRN
  Administered 2020-05-02: 25 ug via INTRAVENOUS
  Administered 2020-05-02 (×2): 50 ug via INTRAVENOUS
  Administered 2020-05-02: 25 ug via INTRAVENOUS

## 2020-05-02 MED ORDER — DEXAMETHASONE SODIUM PHOSPHATE 10 MG/ML IJ SOLN
INTRAMUSCULAR | Status: DC | PRN
  Administered 2020-05-02: 4 mg via INTRAVENOUS

## 2020-05-02 MED ORDER — LIDOCAINE 2% (20 MG/ML) 5 ML SYRINGE
INTRAMUSCULAR | Status: DC | PRN
  Administered 2020-05-02: 100 mg via INTRAVENOUS

## 2020-05-02 MED ORDER — OXYCODONE HCL 5 MG PO TABS
5.0000 mg | ORAL_TABLET | ORAL | Status: DC | PRN
Start: 2020-05-02 — End: 2020-05-02

## 2020-05-02 MED ORDER — OXYCODONE HCL 5 MG PO TABS: 5.0000 mg | ORAL_TABLET | Freq: Three times a day (TID) | ORAL | 0 refills | Status: DC | PRN

## 2020-05-02 MED ORDER — AMISULPRIDE (ANTIEMETIC) 5 MG/2ML IV SOLN: 10.0000 mg | Freq: Once | INTRAVENOUS | Status: DC | PRN

## 2020-05-02 MED ORDER — GABAPENTIN 300 MG PO CAPS
300.0000 mg | ORAL_CAPSULE | ORAL | Status: AC
  Administered 2020-05-02: 09:00:00 300 mg via ORAL
  Filled 2020-05-02: qty 1

## 2020-05-02 MED ORDER — ROCURONIUM BROMIDE 10 MG/ML (PF) SYRINGE
PREFILLED_SYRINGE | INTRAVENOUS | Status: AC
  Filled 2020-05-02: qty 10

## 2020-05-02 MED ORDER — ONDANSETRON HCL 4 MG/2ML IJ SOLN
INTRAMUSCULAR | Status: DC | PRN
  Administered 2020-05-02: 4 mg via INTRAVENOUS

## 2020-05-02 MED ORDER — FENTANYL CITRATE (PF) 100 MCG/2ML IJ SOLN: 25.0000 ug | INTRAMUSCULAR | Status: DC | PRN

## 2020-05-02 MED ORDER — PROPOFOL 10 MG/ML IV BOLUS
INTRAVENOUS | Status: AC
  Filled 2020-05-02: qty 20

## 2020-05-02 MED ORDER — CHLORHEXIDINE GLUCONATE 0.12 % MT SOLN: 15.0000 mL | Freq: Once | OROMUCOSAL | Status: AC

## 2020-05-02 MED ORDER — OXYCODONE HCL 5 MG PO TABS: 5.0000 mg | ORAL_TABLET | Freq: Once | ORAL | Status: DC | PRN

## 2020-05-02 MED ORDER — ORAL CARE MOUTH RINSE
15.0000 mL | Freq: Once | OROMUCOSAL | Status: AC
  Administered 2020-05-02: 09:00:00 15 mL via OROMUCOSAL

## 2020-05-02 MED ORDER — FENTANYL CITRATE (PF) 100 MCG/2ML IJ SOLN
25.0000 ug | INTRAMUSCULAR | Status: DC | PRN
  Administered 2020-05-02: 50 ug via INTRAVENOUS

## 2020-05-02 MED ORDER — OXYCODONE HCL 5 MG PO TABS
ORAL_TABLET | ORAL | Status: AC
  Filled 2020-05-02: qty 1

## 2020-05-02 MED ORDER — SODIUM CHLORIDE 0.9% FLUSH: 3.0000 mL | INTRAVENOUS | Status: DC | PRN

## 2020-05-02 MED ORDER — LIDOCAINE HCL (PF) 2 % IJ SOLN
INTRAMUSCULAR | Status: AC
  Filled 2020-05-02: qty 5

## 2020-05-02 MED ORDER — DEXAMETHASONE SODIUM PHOSPHATE 10 MG/ML IJ SOLN
INTRAMUSCULAR | Status: AC
  Filled 2020-05-02: qty 1

## 2020-05-02 MED ORDER — ACETAMINOPHEN 325 MG PO TABS: 650.0000 mg | ORAL_TABLET | ORAL | Status: DC | PRN

## 2020-05-02 MED ORDER — OXYCODONE HCL 5 MG/5ML PO SOLN: 5.0000 mg | Freq: Once | ORAL | Status: DC | PRN

## 2020-05-02 MED ORDER — MIDAZOLAM HCL 2 MG/2ML IJ SOLN
INTRAMUSCULAR | Status: DC | PRN
  Administered 2020-05-02: 2 mg via INTRAVENOUS

## 2020-05-02 MED ORDER — ACETAMINOPHEN 500 MG PO TABS
1000.0000 mg | ORAL_TABLET | ORAL | Status: AC
  Administered 2020-05-02: 09:00:00 1000 mg via ORAL
  Filled 2020-05-02: qty 2

## 2020-05-02 MED FILL — oxyCODONE HCL 5 MG TABS: 5 | 5 days supply | Qty: 15 | Fill #0

## 2020-05-02 SURGICAL SUPPLY — 41 items
APL PRP STRL LF DISP 70% ISPRP (MISCELLANEOUS) ×1
APL SKNCLS STERI-STRIP NONHPOA (GAUZE/BANDAGES/DRESSINGS) ×1
BENZOIN TINCTURE PRP APPL 2/3 (GAUZE/BANDAGES/DRESSINGS) ×3 IMPLANT
BLADE SURG 15 STRL LF DISP TIS (BLADE) ×1 IMPLANT
BLADE SURG 15 STRL SS (BLADE) ×3
CHLORAPREP W/TINT 26 (MISCELLANEOUS) ×3 IMPLANT
CLOSURE WOUND 1/2 X4 (GAUZE/BANDAGES/DRESSINGS) ×1
COVER SURGICAL LIGHT HANDLE (MISCELLANEOUS) ×3 IMPLANT
COVER WAND RF STERILE (DRAPES) IMPLANT
DECANTER SPIKE VIAL GLASS SM (MISCELLANEOUS) ×3 IMPLANT
DRAIN PENROSE 0.5X18 (DRAIN) ×3 IMPLANT
DRAPE LAPAROSCOPIC ABDOMINAL (DRAPES) ×3 IMPLANT
DRSG TEGADERM 4X4.75 (GAUZE/BANDAGES/DRESSINGS) ×2 IMPLANT
ELECT REM PT RETURN 15FT ADLT (MISCELLANEOUS) ×3 IMPLANT
GAUZE SPONGE 4X4 12PLY STRL (GAUZE/BANDAGES/DRESSINGS) ×2 IMPLANT
GLOVE BIO SURGEON STRL SZ 6 (GLOVE) ×3 IMPLANT
GLOVE INDICATOR 6.5 STRL GRN (GLOVE) ×3 IMPLANT
GOWN STRL REUS W/TWL LRG LVL3 (GOWN DISPOSABLE) ×3 IMPLANT
GOWN STRL REUS W/TWL XL LVL3 (GOWN DISPOSABLE) ×3 IMPLANT
KIT BASIN OR (CUSTOM PROCEDURE TRAY) ×3 IMPLANT
KIT TURNOVER KIT A (KITS) IMPLANT
MESH ULTRAPRO 3X6 7.6X15CM (Mesh General) ×2 IMPLANT
NEEDLE HYPO 22GX1.5 SAFETY (NEEDLE) ×3 IMPLANT
PACK BASIC VI WITH GOWN DISP (CUSTOM PROCEDURE TRAY) ×3 IMPLANT
PENCIL SMOKE EVACUATOR (MISCELLANEOUS) IMPLANT
SPONGE LAP 4X18 RFD (DISPOSABLE) ×3 IMPLANT
STRIP CLOSURE SKIN 1/2X4 (GAUZE/BANDAGES/DRESSINGS) ×2 IMPLANT
SUT ETHIBOND 0 MO6 C/R (SUTURE) ×3 IMPLANT
SUT MNCRL AB 4-0 PS2 18 (SUTURE) ×3 IMPLANT
SUT PDS AB 0 CT1 36 (SUTURE) ×6 IMPLANT
SUT SILK 3 0 (SUTURE) ×3
SUT SILK 3-0 18XBRD TIE 12 (SUTURE) ×1 IMPLANT
SUT VIC AB 3-0 SH 27 (SUTURE) ×6
SUT VIC AB 3-0 SH 27XBRD (SUTURE) ×2 IMPLANT
SUT VICRYL 0 UR6 27IN ABS (SUTURE) IMPLANT
SUT VICRYL 3 0 BR 18  UND (SUTURE) ×3
SUT VICRYL 3 0 BR 18 UND (SUTURE) ×1 IMPLANT
SYR CONTROL 10ML LL (SYRINGE) ×3 IMPLANT
TOWEL OR 17X26 10 PK STRL BLUE (TOWEL DISPOSABLE) ×3 IMPLANT
TOWEL OR NON WOVEN STRL DISP B (DISPOSABLE) ×3 IMPLANT
TRAY FOLEY MTR SLVR 16FR STAT (SET/KITS/TRAYS/PACK) ×2 IMPLANT

## 2020-05-02 NOTE — Anesthesia Procedure Notes (Signed)
Procedure Name: LMA Insertion Date/Time: 05/02/2020 8:58 AM Performed by: Niel Hummer, CRNA Pre-anesthesia Checklist: Patient identified, Suction available, Emergency Drugs available and Patient being monitored Patient Re-evaluated:Patient Re-evaluated prior to induction Oxygen Delivery Method: Circle system utilized Preoxygenation: Pre-oxygenation with 100% oxygen Induction Type: IV induction LMA: LMA inserted LMA Size: 4.0 Number of attempts: 1 Dental Injury: Teeth and Oropharynx as per pre-operative assessment

## 2020-05-02 NOTE — H&P (Signed)
Surgical Evaluation   HPI: this is a very pleasant 64 year old woman who presents with a left inguinal hernia when she first noticed in May.  She experiences burning pain and noted a bulge in the area.  This is reducible.  Denies associated GI symptoms.  She had a right inguinal hernia repair by Dr. Rebekah Chesterfield many years ago, denies any other abdominal surgery.       Allergies  Allergen Reactions  . Atenolol Anaphylaxis  . Codeine         Past Medical History:  Diagnosis Date  . Anxiety   . Cervical radiculopathy   . Chronic pain syndrome   . Depression   . GERD (gastroesophageal reflux disease)   . Hyperlipidemia   . Hypertension   . Peptic ulcer   . Type 2 diabetes mellitus ([place])          Past Surgical History:  Procedure Laterality Date  . BUNIONECTOMY    . CERVICAL DISC SURGERY    . CHOLECYSTECTOMY           Family History  Problem Relation Age of Onset  . Heart attack Mother   . Asthma Mother   . Heart disease Father   . Stomach cancer Paternal Aunt   . Colon cancer Paternal Aunt     Social History        Socioeconomic History  . Marital status: Married    Spouse name: Not on file  . Number of children: Not on file  . Years of education: Not on file  . Highest education level: Not on file  Occupational History  . Not on file  Tobacco Use  . Smoking status: Former Research scientist (life sciences)  . Smokeless tobacco: Never Used  Vaping Use  . Vaping Use: Never used  Substance and Sexual Activity  . Alcohol use: Never  . Drug use: Never  . Sexual activity: Not on file  Other Topics Concern  . Not on file  Social History Narrative  . Not on file   Social Determinants of Health      Financial Resource Strain:   . Difficulty of Paying Living Expenses: Not on file  Food Insecurity:   . Worried About Charity fundraiser in the Last Year: Not on file  . Ran Out of Food in the Last Year: Not on file  Transportation Needs:   . Lack of  Transportation (Medical): Not on file  . Lack of Transportation (Non-Medical): Not on file  Physical Activity:   . Days of Exercise per Week: Not on file  . Minutes of Exercise per Session: Not on file  Stress:   . Feeling of Stress : Not on file  Social Connections:   . Frequency of Communication with Friends and Family: Not on file  . Frequency of Social Gatherings with Friends and Family: Not on file  . Attends Religious Services: Not on file  . Active Member of Clubs or Organizations: Not on file  . Attends Archivist Meetings: Not on file  . Marital Status: Not on file          Current Outpatient Medications on File Prior to Visit  Medication Sig Dispense Refill  . albuterol (VENTOLIN HFA) 108 (90 Base) MCG/ACT inhaler Inhale 2 puffs into the lungs in the morning, at noon, and at bedtime.    . ALPRAZolam (XANAX) 0.5 MG tablet Take 0.5 mg by mouth in the morning, at noon, in the evening, and at bedtime.    Marland Kitchen  cholecalciferol (VITAMIN D3) 25 MCG (1000 UNIT) tablet Take 1,000 Units by mouth daily.    Marland Kitchen escitalopram (LEXAPRO) 20 MG tablet Take 20 mg by mouth daily.    Marland Kitchen esomeprazole (NEXIUM) 40 MG capsule Take 1 capsule (40 mg total) by mouth in the morning. 90 capsule 1  . glimepiride (AMARYL) 2 MG tablet Take 2 mg by mouth daily with breakfast.    . insulin detemir (LEVEMIR) 100 UNIT/ML injection Inject 45 Units into the skin daily.    Marland Kitchen losartan (COZAAR) 25 MG tablet Take 25 mg by mouth daily.    . Semaglutide,0.25 or 0.5MG /DOS, (OZEMPIC, 0.25 OR 0.5 MG/DOSE,) 2 MG/1.5ML SOPN Inject 1 mg into the skin once a week.    . triamterene-hydrochlorothiazide (DYAZIDE) 37.5-25 MG capsule Take 1 capsule by mouth daily.     No current facility-administered medications on file prior to visit.    Review of Systems: a complete, 10pt review of systems was completed with pertinent positives and negatives as documented in the HPI  Physical Exam: Vitals   Weight: 229 lb Height: 66in Body Surface Area: 2.12 m Body Mass Index: 36.96 kg/m  Temp.: 98.48F  Pulse: 85 (Regular)  P.OX: 97% (Room air) BP: 124/76(Sitting, Left Arm, Standard)  Alert well-appearing, unlabored respirations Abdomen is obese, nontender. There is reducible left hernia, no palpable hernia on the right, well-healed scar   CBC Latest Ref Rng & Units 12/25/2019  WBC 4.0 - 10.5 K/uL 11.9(H)  Hemoglobin 12.0 - 15.0 g/dL 13.2  Hematocrit 36 - 46 % 39.6  Platelets 150 - 400 K/uL 204.0    CMP Latest Ref Rng & Units 12/25/2019  Glucose 70 - 99 mg/dL 141(H)  BUN 6 - 23 mg/dL 18  Creatinine 0.40 - 1.20 mg/dL 0.94  Sodium 135 - 145 mEq/L 139  Potassium 3.5 - 5.1 mEq/L 3.8  Chloride 96 - 112 mEq/L 101  CO2 19 - 32 mEq/L 30  Calcium 8.4 - 10.5 mg/dL 9.6  Total Protein 6.0 - 8.3 g/dL 7.9  Total Bilirubin 0.2 - 1.2 mg/dL 0.6  Alkaline Phos 39 - 117 U/L 92  AST 0 - 37 U/L 28  ALT 0 - 35 U/L 37(H)    Recent Labs  No results found for: INR, PROTIME    Imaging: Imaging Results (Last 48 hours)  No results found.     A/P: LEFT INGUINAL HERNIA (K40.90) Story: We discussed the relevant anatomy and we discussed the technique of the procedure, going over both the open and laparoscopic/robotic approach. Discussed risks of bleeding, infection, pain, scarring, injury to structures in the area including nerves, blood vessels, bowel, bladder, risk of chronic pain, hernia recurrence, risk of seroma or hematoma, urinary retention, and risks of general anesthesia including cardiovascular, pulmonary, and thromboembolic complications. Questions were answered. In this unilateral nonrecurrent hernia I recommend an open approach and she wishes for the same.     Romana Juniper, MD [facility] Surgery, [place]  See AMION to contact appropriate on-call provider

## 2020-05-02 NOTE — Anesthesia Preprocedure Evaluation (Signed)
Anesthesia Evaluation  Patient identified by MRN, date of birth, ID band Patient awake    Reviewed: Allergy & Precautions, NPO status , Patient's Chart, lab work & pertinent test results  History of Anesthesia Complications Negative for: history of anesthetic complications  Airway Mallampati: II  TM Distance: >3 FB Neck ROM: Full    Dental  (+) Edentulous Upper, Edentulous Lower   Pulmonary asthma , former smoker,    Pulmonary exam normal        Cardiovascular hypertension, Pt. on medications Normal cardiovascular exam     Neuro/Psych Anxiety Depression negative neurological ROS     GI/Hepatic Neg liver ROS, PUD, GERD  Medicated and Controlled,  Endo/Other  diabetes, Type 2, Insulin Dependent  Renal/GU negative Renal ROS  negative genitourinary   Musculoskeletal negative musculoskeletal ROS (+)   Abdominal   Peds  Hematology negative hematology ROS (+)   Anesthesia Other Findings   Reproductive/Obstetrics                           Anesthesia Physical Anesthesia Plan  ASA: III  Anesthesia Plan: General   Post-op Pain Management:    Induction: Intravenous  PONV Risk Score and Plan: 4 or greater and Ondansetron, Dexamethasone, Midazolam and Treatment may vary due to age or medical condition  Airway Management Planned: LMA  Additional Equipment: None  Intra-op Plan:   Post-operative Plan: Extubation in OR  Informed Consent: I have reviewed the patients History and Physical, chart, labs and discussed the procedure including the risks, benefits and alternatives for the proposed anesthesia with the patient or authorized representative who has indicated his/her understanding and acceptance.     Dental advisory given  Plan Discussed with:   Anesthesia Plan Comments:         Anesthesia Quick Evaluation

## 2020-05-02 NOTE — Anesthesia Postprocedure Evaluation (Signed)
Anesthesia Post Note  Patient: Amy Garner  Procedure(s) Performed: OPEN LEFT INGUINAL HERNIA REPAIR WITH MESH, TAP BLOCK (Left )     Patient location during evaluation: PACU Anesthesia Type: General Level of consciousness: awake and alert Pain management: pain level controlled Vital Signs Assessment: post-procedure vital signs reviewed and stable Respiratory status: spontaneous breathing, nonlabored ventilation and respiratory function stable Cardiovascular status: blood pressure returned to baseline and stable Postop Assessment: no apparent nausea or vomiting Anesthetic complications: no   No complications documented.  Last Vitals:  Vitals:   05/02/20 1130 05/02/20 1155  BP: 114/66 104/74  Pulse: 68 73  Resp: 11 16  Temp: 36.7 C 37 C  SpO2: 95% 97%    Last Pain:  Vitals:   05/02/20 1155  TempSrc:   PainSc: 0-No pain                 Lidia Collum

## 2020-05-02 NOTE — Transfer of Care (Signed)
Immediate Anesthesia Transfer of Care Note  Patient: Amy Garner  Procedure(s) Performed: OPEN LEFT INGUINAL HERNIA REPAIR WITH MESH, TAP BLOCK (Left )  Patient Location: PACU  Anesthesia Type:General  Level of Consciousness: awake, alert  and oriented  Airway & Oxygen Therapy: Patient Spontanous Breathing and Patient connected to face mask oxygen  Post-op Assessment: Report given to RN, Post -op Vital signs reviewed and stable and Patient moving all extremities X 4  Post vital signs: Reviewed and stable  Last Vitals:  Vitals Value Taken Time  BP    Temp    Pulse 74 05/02/20 1032  Resp 11 05/02/20 1032  SpO2 100 % 05/02/20 1032  Vitals shown include unvalidated device data.  Last Pain:  Vitals:   05/02/20 0827  TempSrc:   PainSc: 3          Complications: No complications documented.

## 2020-05-02 NOTE — Op Note (Signed)
Operative Note  Amy Garner  [id number]  [id number]  05/02/2020   Surgeon: Clovis Riley MD FACS   Procedure performed: Open repair of left indirect and direct inguinal hernia with mesh   Preop diagnosis:  left inguinal hernia   Post-op diagnosis/intraop findings: Moderate indirect and small direct inguinal hernia, small retroperitoneal lipoma   Specimens: none   EBL: 5cc   Complications: none   Description of procedure: After obtaining informed consent, the patient was taken to the operating room and placed supine on operating room table where general anesthesia was initiated, preoperative antibiotics were administered, SCDs applied, and a formal timeout was performed.  Foley catheter was inserted which is removed in the case. The groin was clipped, prepped and draped in the usual sterile fashion. An oblique incision was made in the just above the inguinal ligament after infiltrating the tissues with local anesthetic (quarter percent Marcaine with epinephrine and Exparel). Soft tissues were dissected using electrocautery until the external oblique aponeurosis was encountered. This was divided sharply to expand the external ring. A plane was bluntly developed between the spermatic cord and the external oblique. The ilioinguinal nerve was not visualized.  The hernia sac and round ligament were bluntly dissected away from the pubic tubercle and surrounding soft tissues.  The round ligament was divided between hemostats and each and ligated with 3-0 Vicryl ties.  The indirect hernia sac was bluntly dissected away from the round ligament and internal ring and reduced into the abdomen.   A small cord lipoma laterally was ligated with a 3-0 Vicryl tie and excised.   There was essentially a confluent indirect and direct defect in the inferior epigastrics were visible in the center of this.  The inguinal floor was then reconstructed with interrupted figure-of-eight 0 PDS sutures.. A 3 x 6 piece  of ultra Pro mesh was brought onto the field and trimmed to approximate the field. This was sutured to the pubic tubercle fascia, inferior shelving edge and the internal oblique superiorly with interrupted 0 Ethibonds.  The mesh was also secured centrally to the internal oblique with a couple simple interrupted 0 Ethibonds. Hemostasis was ensured within the wound. The external oblique aponeurosis was reapproximated with a running 3-0 Vicryl to re-create a narrowed external ring. More local was infiltrated around the pubic tubercle and in the plane just below the external oblique. The Scarpa's was reapproximated with interrupted 3-0 Vicryls. The skin was closed with interrupted deep dermal 3-0 Vicryl and a running subcuticular 4-0 Monocryl. The remainder of the local was injected along the pubic tubercle as well as in the distribution of the ilioinguinal and iliohypogastric nerves, starting 2 fingerbreadths medial and cephalad to the anterior superior iliac spine, in the subcutaneous and subcuticular space. The field was then cleaned, benzoin and Steri-Strips and sterile bandage were applied. The patient was then awakened, extubated and taken to PACU in stable condition.    All counts were correct at the completion of the case

## 2020-05-02 NOTE — Discharge Instructions (Signed)
HERNIA REPAIR: POST OP INSTRUCTIONS   EAT Gradually transition to a high fiber diet with a fiber supplement over the next few weeks after discharge.  Start with a pureed / full liquid diet (see below)  WALK Walk an hour a day (cumulative- not all at once).  Control your pain to do that.    CONTROL PAIN Control pain so that you can walk, sleep, tolerate sneezing/coughing, and go up/down stairs.  HAVE A BOWEL MOVEMENT DAILY Keep your bowels regular to avoid problems.  OK to try a laxative to override constipation.  OK to use an antidairrheal to slow down diarrhea.  Call if not better after 2 tries  CALL IF YOU HAVE PROBLEMS/CONCERNS Call if you are still struggling despite following these instructions. Call if you have concerns not answered by these instructions  ######################################################################    1. DIET: Follow a light bland diet & liquids the first 24 hours after arrival home, such as soup, liquids, starches, etc.  Be sure to drink plenty of fluids.  Quickly advance to a usual solid diet within a few days.  Avoid fast food or heavy meals as your are more likely to get nauseated or have irregular bowels.  A low-sugar, high-fiber diet for the rest of your life is ideal.   2. Take your usually prescribed home medications unless otherwise directed.  3. PAIN CONTROL: a. Pain is best controlled by a usual combination of three different methods TOGETHER: i. Ice/Heat ii. Over the counter pain medication iii. Prescription pain medication b. Most patients will experience some swelling and bruising around the hernia(s) such as the bellybutton, groins, or old incisions.  Ice packs or heating pads (30-60 minutes up to 6 times a day) will help. Use ice for the first few days to help decrease swelling and bruising, then switch to heat to help relax tight/sore spots and speed recovery.  Some people prefer to use ice alone, heat alone, alternating between ice &  heat.  Experiment to what works for you.  Swelling and bruising can take several weeks to resolve.   c. It is helpful to take an over-the-counter pain medication regularly for the first days: i. Naproxen (Aleve, etc)  Two 266m tabs twice a day OR Ibuprofen (Advil, etc) Three 2010mtabs four times a day (every meal & bedtime) AND ii. Acetaminophen (Tylenol, etc) 325-65057mour times a day (every meal & bedtime) d. A  prescription for pain medication should be given to you upon discharge.  Take your pain medication as prescribed, IF NEEDED.  i. If you are having problems/concerns with the prescription medicine (does not control pain, nausea, vomiting, rash, itching, etc), please call us [phone] see if we need to switch you to a different pain medicine that will work better for you and/or control your side effect better. ii. If you need a refill on your pain medication, please contact your pharmacy.  They will contact our office to request authorization. Prescriptions will not be filled after 5 pm or on week-ends.  4. Avoid getting constipated.  Between the surgery and the pain medications, it is common to experience some constipation.  Increasing fluid intake and taking a fiber supplement (such as Metamucil, Citrucel, FiberCon, MiraLax, etc) 1-2 times a day regularly will usually help prevent this problem from occurring.  A mild laxative (prune juice, Milk of Magnesia, MiraLax, etc) should be taken according to package directions if there are no bowel movements after 48 hours.    5.  Wash / shower every day, starting 2 days after surgery.  You may shower over the steri strips which are waterproof.    6. Remove your outer bandage 2 days after surgery. Steri strips will peel off after 1-2 weeks.  You may leave the incision open to air.  You may replace a dressing/Band-Aid to cover an incision for comfort if you wish.  Continue to shower over incision(s) after the dressing is off.  7. ACTIVITIES  as tolerated:   a. You may resume regular (light) daily activities beginning the next day--such as daily self-care, walking, climbing stairs--gradually increasing activities as tolerated.  Control your pain so that you can walk an hour a day.  If you can walk 30 minutes without difficulty, it is safe to try more intense activity such as jogging, treadmill, bicycling, low-impact aerobics, swimming, etc. b. Refrain from the most intensive and strenuous activity such as sit-ups, heavy lifting, contact sports, etc  Refrain from any heavy lifting or straining until 6 weeks after surgery.   c. DO NOT PUSH THROUGH PAIN.  Let pain be your guide: If it hurts to do something, don't do it.  Pain is your body warning you to avoid that activity for another week until the pain goes down. d. You may drive when you are no longer taking prescription pain medication, you can comfortably wear a seatbelt, and you can safely maneuver your car and apply brakes. e. Dennis Bast may have sexual intercourse when it is comfortable.   8. FOLLOW UP in our office a. Please call [facility] at [phone] to set up an appointment to see your surgeon in the office for a follow-up appointment approximately 2-3 weeks after your surgery. b. Make sure that you call for this appointment the day you arrive home to insure a convenient appointment time.  9.  If you have disability of FMLA / Family leave forms, please bring the forms to the office for processing.  (do not give to your surgeon).  WHEN TO CALL us [phone]: 1. Poor pain control 2. Reactions / problems with new medications (rash/itching, nausea, etc)  3. Fever over 101.5 F (38.5 C) 4. Inability to urinate 5. Nausea and/or vomiting 6. Worsening swelling or bruising 7. Continued bleeding from incision. 8. Increased pain, redness, or drainage from the incision   The clinic staff is available to answer your questions during regular business hours (8:30am-5pm).  Please dont  hesitate to call and ask to speak to one of our nurses for clinical concerns.   If you have a medical emergency, go to the nearest emergency room or call 911.  A surgeon from [facility] is always on call at the hospitals in [facility], [place], [place], [place], [place]  [address] ?  [address] [address], [place], [place]   [address] MAIN: [phone] ? TOLL FREE: [phone] ? FAX: [phone] [email or web address]

## 2020-05-05 ENCOUNTER — Encounter (HOSPITAL_COMMUNITY): Payer: Self-pay | Admitting: Surgery

## 2021-05-21 ENCOUNTER — Ambulatory Visit (INDEPENDENT_AMBULATORY_CARE_PROVIDER_SITE_OTHER): Payer: Medicare PPO

## 2021-05-21 ENCOUNTER — Encounter: Payer: Self-pay | Admitting: Emergency Medicine

## 2021-05-21 ENCOUNTER — Ambulatory Visit
Admission: EM | Admit: 2021-05-21 | Discharge: 2021-05-21 | Disposition: A | Discharge status: Home / Self Care | Payer: Medicare PPO | Attending: Internal Medicine | Admitting: Internal Medicine

## 2021-05-21 DIAGNOSIS — M79604 Pain in right leg: Secondary | ICD-10-CM

## 2021-05-21 DIAGNOSIS — M25571 Pain in right ankle and joints of right foot: Secondary | ICD-10-CM

## 2021-05-21 DIAGNOSIS — M545 Low back pain, unspecified: Secondary | ICD-10-CM | POA: Diagnosis not present

## 2021-05-21 DIAGNOSIS — M25551 Pain in right hip: Secondary | ICD-10-CM | POA: Diagnosis not present

## 2021-05-21 DIAGNOSIS — M25561 Pain in right knee: Secondary | ICD-10-CM

## 2021-05-21 DIAGNOSIS — W19XXXA Unspecified fall, initial encounter: Secondary | ICD-10-CM | POA: Diagnosis not present

## 2021-05-21 NOTE — Discharge Instructions (Signed)
All of your x-rays were negative.  A knee brace has been applied to help immobilize and help with your knee pain.  Please use ice application to affected areas.  Follow-up with primary care or urgent care if symptoms persist or worsen.  You may also follow-up with provided contact information for orthopedist as well.

## 2021-05-21 NOTE — ED Triage Notes (Signed)
Fall yesterday, right leg, ankle, hip, and right side of lower back pain. Able to walk very slowly and with moderate limp

## 2021-05-21 NOTE — ED Provider Notes (Addendum)
[facility]    CSN: [id number] Arrival date & time: 05/21/21  1302      History   Chief Complaint Chief Complaint  Patient presents with   Fall    HPI Amy Garner is a 66 y.o. female.   Patient presents for further evaluation after a fall that occurred yesterday.  Patient reports that she was walking down the ramp at her house when she slipped at the end and fell.  She reports that her legs both went laterally into a split position and then she went forward and fell onto her elbows.  Pain is occurring in the mid lower back, right hip, right knee, right lower leg, right ankle, right foot.  Denies any numbness or tingling.  Patient is able to bear weight but with pain.  Denies urinary or bowel incontinence or saddle anesthesia.  Denies hitting head or losing consciousness during fall.   Fall   Past Medical History:  Diagnosis Date   Anxiety    Asthma    When ill   Cervical radiculopathy    Chronic pain syndrome    Depression    GERD (gastroesophageal reflux disease)    Hyperlipidemia    Hypertension    Peptic ulcer    Type 2 diabetes mellitus ([place])     There are no problems to display for this patient.   Past Surgical History:  Procedure Laterality Date   BUNIONECTOMY     CERVICAL DISC SURGERY     CHOLECYSTECTOMY     DIAGNOSTIC LAPAROSCOPY  1980   endometriosis   INGUINAL HERNIA REPAIR Left 05/02/2020   Procedure: OPEN LEFT INGUINAL HERNIA REPAIR WITH MESH, TAP BLOCK;  Surgeon: Clovis Riley, MD;  Location: [facility];  Service: General;  Laterality: Left;   TUBAL LIGATION  1990s    OB History   No obstetric history on file.      Home Medications    Prior to Admission medications   Medication Sig Start Date End Date Taking? Authorizing Provider  albuterol (VENTOLIN HFA) 108 (90 Base) MCG/ACT inhaler Inhale 2 puffs into the lungs every 6 (six) hours as needed for wheezing or shortness of breath.     [provider]   cholecalciferol (VITAMIN D3) 25 MCG (1000 UNIT) tablet Take 1,000 Units by mouth daily.    [provider]  escitalopram (LEXAPRO) 20 MG tablet Take 20 mg by mouth daily.    [provider]  esomeprazole (NEXIUM) 40 MG capsule Take 1 capsule (40 mg total) by mouth in the morning. 12/25/19   Ladene Artist, MD  glimepiride (AMARYL) 2 MG tablet Take 2 mg by mouth daily with breakfast.    [provider]  insulin detemir (LEVEMIR) 100 UNIT/ML injection Inject 45 Units into the skin every morning.     [provider]  losartan (COZAAR) 25 MG tablet Take 25 mg by mouth daily.    [provider]  metFORMIN (GLUCOPHAGE) 500 MG tablet Take 1,000 mg by mouth 2 (two) times daily with a meal.    [provider]  Semaglutide,0.25 or 0.5MG /DOS, (OZEMPIC, 0.25 OR 0.5 MG/DOSE,) 2 MG/1.5ML SOPN Inject 1 mg into the skin once a week.    [provider]  triamterene-hydrochlorothiazide (DYAZIDE) 37.5-25 MG capsule Take 1 capsule by mouth daily.    [provider]    Family History Family History  Problem Relation Age of Onset   Heart attack Mother    Asthma Mother  Heart disease Father    Stomach cancer Paternal Aunt    Colon cancer Paternal Aunt     Social History Social History   Tobacco Use   Smoking status: Former    Packs/day: 1.00    Years: 15.00    Pack years: 15.00    Types: Cigarettes    Quit date: 2017    Years since quitting: 6.0   Smokeless tobacco: Never  Vaping Use   Vaping Use: Never used  Substance Use Topics   Alcohol use: Never   Drug use: Never     Allergies   Atenolol and Codeine   Review of Systems Review of Systems Per HPI  Physical Exam Triage Vital Signs ED Triage Vitals [05/21/21 1352]  Enc Vitals Group     BP (!) 145/79     Pulse Rate 71     Resp 16     Temp 98.8 F (37.1 C)     Temp Source Oral     SpO2 93 %     Weight      Height      Head Circumference      Peak Flow       Pain Score 8     Pain Loc      Pain Edu?      Excl. in [place]?    No data found.  Updated Vital Signs BP (!) 145/79    Pulse 71    Temp 98.8 F (37.1 C) (Oral)    Resp 16    SpO2 93%   Visual Acuity Right Eye Distance:   Left Eye Distance:   Bilateral Distance:    Right Eye Near:   Left Eye Near:    Bilateral Near:     Physical Exam Constitutional:      General: She is not in acute distress.    Appearance: Normal appearance. She is not toxic-appearing or diaphoretic.  HENT:     Head: Normocephalic and atraumatic.  Eyes:     Extraocular Movements: Extraocular movements intact.     Conjunctiva/sclera: Conjunctivae normal.  Pulmonary:     Effort: Pulmonary effort is normal.  Musculoskeletal:     Cervical back: Normal.     Thoracic back: Normal.     Lumbar back: Tenderness present. No swelling or edema.       Back:     Comments: Tenderness to palpation to circled area on diagram at lower lumbar spine/pelvis.  No crepitus or step-off noted.  Patient also has tenderness to palpation to right hip.  No swelling, erythema, bruising noted to hip or lower back.  Patient does have mild swelling with associated tenderness to palpation to anterior knee directly below patella.  No bruising or erythema noted.  Also has tenderness to palpation to anterior shin as well as calf of right lower extremity.  Moderate swelling noted to right ankle with tenderness to palpation.  Tenderness to palpation to dorsal surface of right foot.  Neurovascular intact.  Neurological:     General: No focal deficit present.     Mental Status: She is alert and oriented to person, place, and time. Mental status is at baseline.  Psychiatric:        Mood and Affect: Mood normal.        Behavior: Behavior normal.        Thought Content: Thought content normal.        Judgment: Judgment normal.     UC Treatments / Results  Labs (all  labs ordered are listed, but only abnormal results are displayed) Labs  Reviewed - No data to display  EKG   Radiology DG Lumbar Spine Complete  Result Date: 05/21/2021 CLINICAL DATA:  Fall. EXAM: LUMBAR SPINE - COMPLETE 4+ VIEW COMPARISON:  CT abdomen and pelvis 01/09/2020 FINDINGS: There are 5 lumbar type vertebrae. There is facet mediated grade 1 anterolisthesis of L4 on L5 which is greater than on the prior supine CT. No acute fracture is identified. Mild disc space narrowing is noted at L4-5. Facet arthrosis is present throughout the mid and lower lumbar spine, severe at L4-5 and L5-S1. Abdominal aortic atherosclerosis is noted. IMPRESSION: 1. No acute osseous abnormality identified. 2. 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 Electronically Signed   By: Logan Bores M.D.   On: 05/21/2021 15:12   DG Ankle Complete Right  Result Date: 05/21/2021 CLINICAL DATA:  Fall, pain EXAM: DG HIP (WITH OR WITHOUT PELVIS) 2-3V RIGHT; RIGHT ANKLE - COMPLETE 3+ VIEW; RIGHT KNEE - COMPLETE 4+ VIEW; RIGHT FOOT COMPLETE - 3+ VIEW COMPARISON:  None. FINDINGS: There is no evidence of displaced hip fracture or dislocation. There is no evidence of arthropathy or other focal bone abnormality. No fracture or dislocation of the right knee. Joint spaces are preserved. Small, nonspecific knee joint effusion. Soft tissues are unremarkable. No fracture or dislocation of the right foot or ankle. Bunion deformity of the right great toe with moderate associated first metatarsophalangeal arthrosis and hammertoe deformities of the remaining digits. Joint spaces are otherwise well preserved. Diffuse soft tissue edema about the ankle. IMPRESSION: 1. No evidence of displaced hip fracture or dislocation. Please note that plain radiographs are significantly insensitive for hip and pelvic fracture. Recommend CT or MRI to more sensitively evaluate for fracture if clinically suspected. 2. No fracture or dislocation of the right knee. Small, nonspecific knee joint effusion. 3.  No fracture or dislocation of the right foot or ankle. 4. Diffuse soft tissue edema about the ankle. 5. Bunion deformity of the right great toe with moderate associated first metatarsophalangeal arthrosis and hammertoe deformities of the remaining digits. Electronically Signed   By: Delanna Ahmadi M.D.   On: 05/21/2021 15:14   DG Knee Complete 4 Views Right  Result Date: 05/21/2021 CLINICAL DATA:  Fall, pain EXAM: DG HIP (WITH OR WITHOUT PELVIS) 2-3V RIGHT; RIGHT ANKLE - COMPLETE 3+ VIEW; RIGHT KNEE - COMPLETE 4+ VIEW; RIGHT FOOT COMPLETE - 3+ VIEW COMPARISON:  None. FINDINGS: There is no evidence of displaced hip fracture or dislocation. There is no evidence of arthropathy or other focal bone abnormality. No fracture or dislocation of the right knee. Joint spaces are preserved. Small, nonspecific knee joint effusion. Soft tissues are unremarkable. No fracture or dislocation of the right foot or ankle. Bunion deformity of the right great toe with moderate associated first metatarsophalangeal arthrosis and hammertoe deformities of the remaining digits. Joint spaces are otherwise well preserved. Diffuse soft tissue edema about the ankle. IMPRESSION: 1. No evidence of displaced hip fracture or dislocation. Please note that plain radiographs are significantly insensitive for hip and pelvic fracture. Recommend CT or MRI to more sensitively evaluate for fracture if clinically suspected. 2. No fracture or dislocation of the right knee. Small, nonspecific knee joint effusion. 3. No fracture or dislocation of the right foot or ankle. 4. Diffuse soft tissue edema about the ankle. 5. Bunion deformity of the right great toe with moderate associated first metatarsophalangeal arthrosis and hammertoe deformities  of the remaining digits. Electronically Signed   By: Delanna Ahmadi M.D.   On: 05/21/2021 15:14   DG Foot Complete Right  Result Date: 05/21/2021 CLINICAL DATA:  Fall, pain EXAM: DG HIP (WITH OR WITHOUT PELVIS) 2-3V  RIGHT; RIGHT ANKLE - COMPLETE 3+ VIEW; RIGHT KNEE - COMPLETE 4+ VIEW; RIGHT FOOT COMPLETE - 3+ VIEW COMPARISON:  None. FINDINGS: There is no evidence of displaced hip fracture or dislocation. There is no evidence of arthropathy or other focal bone abnormality. No fracture or dislocation of the right knee. Joint spaces are preserved. Small, nonspecific knee joint effusion. Soft tissues are unremarkable. No fracture or dislocation of the right foot or ankle. Bunion deformity of the right great toe with moderate associated first metatarsophalangeal arthrosis and hammertoe deformities of the remaining digits. Joint spaces are otherwise well preserved. Diffuse soft tissue edema about the ankle. IMPRESSION: 1. No evidence of displaced hip fracture or dislocation. Please note that plain radiographs are significantly insensitive for hip and pelvic fracture. Recommend CT or MRI to more sensitively evaluate for fracture if clinically suspected. 2. No fracture or dislocation of the right knee. Small, nonspecific knee joint effusion. 3. No fracture or dislocation of the right foot or ankle. 4. Diffuse soft tissue edema about the ankle. 5. Bunion deformity of the right great toe with moderate associated first metatarsophalangeal arthrosis and hammertoe deformities of the remaining digits. Electronically Signed   By: Delanna Ahmadi M.D.   On: 05/21/2021 15:14   DG Hip Unilat W or Wo Pelvis 2-3 Views Right  Result Date: 05/21/2021 CLINICAL DATA:  Fall, pain EXAM: DG HIP (WITH OR WITHOUT PELVIS) 2-3V RIGHT; RIGHT ANKLE - COMPLETE 3+ VIEW; RIGHT KNEE - COMPLETE 4+ VIEW; RIGHT FOOT COMPLETE - 3+ VIEW COMPARISON:  None. FINDINGS: There is no evidence of displaced hip fracture or dislocation. There is no evidence of arthropathy or other focal bone abnormality. No fracture or dislocation of the right knee. Joint spaces are preserved. Small, nonspecific knee joint effusion. Soft tissues are unremarkable. No fracture or dislocation of  the right foot or ankle. Bunion deformity of the right great toe with moderate associated first metatarsophalangeal arthrosis and hammertoe deformities of the remaining digits. Joint spaces are otherwise well preserved. Diffuse soft tissue edema about the ankle. IMPRESSION: 1. No evidence of displaced hip fracture or dislocation. Please note that plain radiographs are significantly insensitive for hip and pelvic fracture. Recommend CT or MRI to more sensitively evaluate for fracture if clinically suspected. 2. No fracture or dislocation of the right knee. Small, nonspecific knee joint effusion. 3. No fracture or dislocation of the right foot or ankle. 4. Diffuse soft tissue edema about the ankle. 5. Bunion deformity of the right great toe with moderate associated first metatarsophalangeal arthrosis and hammertoe deformities of the remaining digits. Electronically Signed   By: Delanna Ahmadi M.D.   On: 05/21/2021 15:14    Procedures Procedures (including critical care time)  Medications Ordered in UC Medications - No data to display  Initial Impression / Assessment and Plan / UC Course  I have reviewed the triage vital signs and the nursing notes.  Pertinent labs & imaging results that were available during my care of the patient were reviewed by me and considered in my medical decision making (see chart for details).     All x-rays were negative for any fracture or dislocation.  Right knee x-ray did show knee effusion.  Knee brace applied in urgent care.  Discussed supportive care  and ice application with the patient.  Radiologist did report that CT imaging was necessary if hip fracture was suspected but I do not have a high suspicion for hip fracture given physical exam.  Discussed strict ER precautions.  Advised patient to go to hospital if no improvement in symptoms or if they worsen.  Also provided patient with orthopedist contact if symptoms persist.  No red flags on exam and patient is  neurovascularly intact.  Discussed degenerative changes of spine with patient.  Patient voiced understanding and advised patient to follow-up with PCP for this.  Patient verbalized understanding and was agreeable with plan. Final Clinical Impressions(s) / UC Diagnoses   Final diagnoses:  Fall, initial encounter  Acute pain of right knee  Acute midline low back pain without sciatica  Right leg pain  Acute right ankle pain     Discharge Instructions      All of your x-rays were negative.  A knee brace has been applied to help immobilize and help with your knee pain.  Please use ice application to affected areas.  Follow-up with primary care or urgent care if symptoms persist or worsen.  You may also follow-up with provided contact information for orthopedist as well.     ED Prescriptions   None    PDMP not reviewed this encounter.   Teodora Medici, [place] 05/21/21 [place], [place], [place] 05/21/21 [phone]

## 2021-07-13 ENCOUNTER — Encounter: Payer: Self-pay | Admitting: Family Medicine

## 2021-07-13 ENCOUNTER — Ambulatory Visit (INDEPENDENT_AMBULATORY_CARE_PROVIDER_SITE_OTHER): Payer: Medicare PPO | Admitting: Family Medicine

## 2021-07-13 ENCOUNTER — Encounter (INDEPENDENT_AMBULATORY_CARE_PROVIDER_SITE_OTHER): Payer: Self-pay

## 2021-07-13 ENCOUNTER — Other Ambulatory Visit: Payer: Self-pay

## 2021-07-13 VITALS — BP 123/83 | HR 79 | Temp 98.1°F | Resp 16 | Ht 66.0 in | Wt 230.6 lb

## 2021-07-13 DIAGNOSIS — I10 Essential (primary) hypertension: Secondary | ICD-10-CM | POA: Diagnosis not present

## 2021-07-13 DIAGNOSIS — Z7689 Persons encountering health services in other specified circumstances: Secondary | ICD-10-CM | POA: Diagnosis not present

## 2021-07-13 DIAGNOSIS — E785 Hyperlipidemia, unspecified: Secondary | ICD-10-CM | POA: Diagnosis not present

## 2021-07-13 MED ORDER — LOVASTATIN 10 MG PO TABS: 10.0000 mg | ORAL_TABLET | Freq: Every day | ORAL | 0 refills | Status: DC

## 2021-07-13 MED ORDER — METFORMIN HCL ER 500 MG PO TB24: 1000.0000 mg | ORAL_TABLET | Freq: Every day | ORAL | 3 refills | Status: DC

## 2021-07-13 NOTE — Progress Notes (Signed)
New Patient Office Visit  Subjective:  Patient ID: Amy Garner, female    DOB: November 28, 1955  Age: 66 y.o. MRN: [id number]  CC:  Chief Complaint  Patient presents with   Establish Care    HPI DARALYN BERT presents for to establish care and for review of chronic med issues including hypertension. Patient denies acute complaints or concerns.   Past Medical History:  Diagnosis Date   Anxiety    Asthma    When ill   Cervical radiculopathy    Chronic pain syndrome    Depression    GERD (gastroesophageal reflux disease)    Hyperlipidemia    Hypertension    Peptic ulcer    Type 2 diabetes mellitus ([place])     Past Surgical History:  Procedure Laterality Date   BUNIONECTOMY     CERVICAL DISC SURGERY     CHOLECYSTECTOMY     DIAGNOSTIC LAPAROSCOPY  1980   endometriosis   INGUINAL HERNIA REPAIR Left 05/02/2020   Procedure: OPEN LEFT INGUINAL HERNIA REPAIR WITH MESH, TAP BLOCK;  Surgeon: Clovis Riley, MD;  Location: [facility];  Service: General;  Laterality: Left;   TUBAL LIGATION  1990s    Family History  Problem Relation Age of Onset   Heart attack Mother    Asthma Mother    Heart disease Father    Stomach cancer Paternal Aunt    Colon cancer Paternal Aunt     Social History   Socioeconomic History   Marital status: Married    Spouse name: Not on file   Number of children: Not on file   Years of education: Not on file   Highest education level: Not on file  Occupational History   Not on file  Tobacco Use   Smoking status: Former    Packs/day: 1.00    Years: 15.00    Pack years: 15.00    Types: Cigarettes    Quit date: 2017    Years since quitting: 6.1   Smokeless tobacco: Never  Vaping Use   Vaping Use: Never used  Substance and Sexual Activity   Alcohol use: Never   Drug use: Never   Sexual activity: Not on file  Other Topics Concern   Not on file  Social History Narrative   Not on file   Social Determinants of Health   Financial  Resource Strain: Not on file  Food Insecurity: Not on file  Transportation Needs: Not on file  Physical Activity: Not on file  Stress: Not on file  Social Connections: Not on file  Intimate Partner Violence: Not on file    ROS Review of Systems  All other systems reviewed and are negative.  Objective:   Today's Vitals: BP 123/83    Pulse 79    Temp 98.1 F (36.7 C) (Oral)    Resp 16    Ht 5\' 6"  (1.676 m)    Wt 230 lb 9.6 oz (104.6 kg)    SpO2 94%    BMI 37.22 kg/m   Physical Exam Vitals and nursing note reviewed.  Constitutional:      General: She is not in acute distress. Cardiovascular:     Rate and Rhythm: Normal rate and regular rhythm.  Pulmonary:     Effort: Pulmonary effort is normal.     Breath sounds: Normal breath sounds.  Abdominal:     Palpations: Abdomen is soft.     Tenderness: There is no abdominal tenderness.  Musculoskeletal:  Right lower leg: No edema.     Left lower leg: No edema.  Neurological:     General: No focal deficit present.     Mental Status: She is alert and oriented to person, place, and time.    Assessment & Plan:   1. Essential hypertension Appears stable with present management. Continue and monitor. Meds refilled.   2. Hyperlipidemia, unspecified hyperlipidemia type Continue present management. Meds refilled.   3. Encounter to establish care     Outpatient Encounter Medications as of 07/13/2021  Medication Sig   albuterol (VENTOLIN HFA) 108 (90 Base) MCG/ACT inhaler Inhale 2 puffs into the lungs every 6 (six) hours as needed for wheezing or shortness of breath.    cholecalciferol (VITAMIN D3) 25 MCG (1000 UNIT) tablet Take 1,000 Units by mouth daily.   escitalopram (LEXAPRO) 20 MG tablet Take 20 mg by mouth daily.   esomeprazole (NEXIUM) 40 MG capsule Take 1 capsule (40 mg total) by mouth in the morning.   glimepiride (AMARYL) 2 MG tablet Take 2 mg by mouth daily with breakfast.   insulin detemir (LEVEMIR) 100 UNIT/ML  injection Inject 45 Units into the skin every morning.    losartan (COZAAR) 25 MG tablet Take 25 mg by mouth daily.   lovastatin (MEVACOR) 10 MG tablet Take by mouth.   metFORMIN (GLUCOPHAGE) 500 MG tablet Take 1,000 mg by mouth 2 (two) times daily with a meal.   Semaglutide,0.25 or 0.5MG /DOS, (OZEMPIC, 0.25 OR 0.5 MG/DOSE,) 2 MG/1.5ML SOPN Inject 1 mg into the skin once a week.   triamterene-hydrochlorothiazide (DYAZIDE) 37.5-25 MG capsule Take 1 capsule by mouth daily.   No facility-administered encounter medications on file as of 07/13/2021.    Follow-up: No follow-ups on file.   Becky Sax, MD

## 2021-07-13 NOTE — Progress Notes (Signed)
Patient is her to establish care. Patient would like to talk about her medications. Patient is a little concern about the metformin

## 2021-07-27 ENCOUNTER — Telehealth: Payer: Self-pay | Admitting: Family Medicine

## 2021-07-27 ENCOUNTER — Other Ambulatory Visit: Payer: Self-pay | Admitting: *Deleted

## 2021-07-27 MED ORDER — LOVASTATIN 10 MG PO TABS: 10.0000 mg | ORAL_TABLET | Freq: Every day | ORAL | 0 refills | Status: DC

## 2021-07-27 MED ORDER — INSULIN DETEMIR 100 UNIT/ML ~~LOC~~ SOLN: 45.0000 [IU] | SUBCUTANEOUS | 2 refills | Status: DC

## 2021-07-27 MED ORDER — MISC. DEVICES MISC: 0 refills | Status: DC

## 2021-07-27 MED ORDER — TRUEPLUS LANCETS 33G MISC: 2 refills | Status: AC

## 2021-07-27 MED ORDER — ALCOHOL SWABS PADS: MEDICATED_PAD | 2 refills | Status: AC

## 2021-07-27 MED ORDER — TRUE METRIX BLOOD GLUCOSE TEST VI STRP: ORAL_STRIP | 12 refills | Status: DC

## 2021-07-27 NOTE — Telephone Encounter (Signed)
Received a call from [employer], requesting some refills be sent to them per patients request. ? ?Ozempic '1mg'$  ?Levemir 100 units ?Lovastatin 10 mg ?True Metric Glucose  Meter ?True Metric Test Strips ?True Plus 33 gauge lancets ? ?The phone # for providers is: [phone], if you have any questions. ?

## 2021-07-28 ENCOUNTER — Other Ambulatory Visit: Payer: Self-pay | Admitting: *Deleted

## 2021-08-13 ENCOUNTER — Telehealth: Payer: Self-pay | Admitting: Family Medicine

## 2021-08-13 NOTE — Telephone Encounter (Signed)
metFORMIN (GLUCOPHAGE-XR) 500 MG 24 hr tablet [[id number]]  ?  Order Details ?Dose: 1,000 mg Route: Oral Frequency: Daily with breakfast  ?Dispense Quantity: 60 tablet Refills: 3   ?     ?Sig: Take 2 tablets (1,000 mg total) by mouth daily with breakfast.  ?     ?Received a VM from [facility] requesting that Metformin RX for 90 day supply be sent to them. ?

## 2021-08-14 ENCOUNTER — Other Ambulatory Visit: Payer: Self-pay | Admitting: *Deleted

## 2021-08-14 MED ORDER — METFORMIN HCL ER 500 MG PO TB24: 1000.0000 mg | ORAL_TABLET | Freq: Every day | ORAL | 3 refills | Status: DC

## 2021-08-20 ENCOUNTER — Other Ambulatory Visit: Payer: Self-pay | Admitting: Family Medicine

## 2021-08-20 NOTE — Telephone Encounter (Signed)
Copied from [place] [id number]. Topic: Quick Communication - Rx Refill/Question ?>> Aug 20, 2021 12:41 PM Yvette Rack wrote: ?Medication: glimepiride (AMARYL) 2 MG tablet and triamterene-hydrochlorothiazide (DYAZIDE) 37.5-25 MG capsule ? ?Has the patient contacted their pharmacy? No. Pt need Rx sent local pharmacy  ?(Agent: If no, request that the patient contact the pharmacy for the refill. If patient does not wish to contact the pharmacy document the reason why and proceed with request.) ?(Agent: If yes, when and what did the pharmacy advise?) ? ?Preferred Pharmacy (with phone number or street name): [id number] [place] [place]  ?Phone: [phone]?Fax: [phone]? ?Has the patient been seen for an appointment in the last year OR does the patient have an upcoming appointment? Yes.   ? ?Agent: Please be advised that RX refills may take up to 3 business days. We ask that you follow-up with your pharmacy. ?

## 2021-08-21 NOTE — Telephone Encounter (Signed)
Requested medication (s) are due for refill today: yes ? ?Requested medication (s) are on the active medication list: yes ? ?Last refill:   ? ?Future visit scheduled: no ? ?Notes to clinic: both medications are historical meds. Unable to refill per protocol due to failed labs, no updated results. ? ? ? ?  ?Requested Prescriptions  ?Pending Prescriptions Disp Refills  ? glimepiride (AMARYL) 2 MG tablet    ?  Sig: Take 1 tablet (2 mg total) by mouth daily with breakfast.  ?  ? Endocrinology:  Diabetes - Sulfonylureas Failed - 08/20/2021  1:16 PM  ?  ?  Failed - HBA1C is between 0 and 7.9 and within 180 days  ?  Hgb A1c MFr Bld  ?Date Value Ref Range Status  ?04/25/2020 6.4 (H) 4.8 - 5.6 % Final  ?  Comment:  ?  (NOTE) ?Pre diabetes:          5.7%-6.4% ? ?Diabetes:              >6.4% ? ?Glycemic control for   <7.0% ?adults with diabetes ?  ?  ?  ?  ?  Failed - Cr in normal range and within 360 days  ?  Creatinine, Ser  ?Date Value Ref Range Status  ?04/25/2020 0.74 0.44 - 1.00 mg/dL Final  ?  ?  ?  ?  Passed - Valid encounter within last 6 months  ?  Recent Outpatient Visits   ? ?      ? 1 month ago Essential hypertension  ? Primary Care at [facility], [place]  ? ?  ?  ? ?  ?  ?  ? triamterene-hydrochlorothiazide (DYAZIDE) 37.5-25 MG capsule    ?  Sig: Take 1 each (1 capsule total) by mouth daily.  ?  ? Cardiovascular: Diuretic Combos Failed - 08/20/2021  1:16 PM  ?  ?  Failed - K in normal range and within 180 days  ?  Potassium  ?Date Value Ref Range Status  ?04/25/2020 4.7 3.5 - 5.1 mmol/L Final  ?  ?  ?  ?  Failed - Na in normal range and within 180 days  ?  Sodium  ?Date Value Ref Range Status  ?04/25/2020 138 135 - 145 mmol/L Final  ?  ?  ?  ?  Failed - Cr in normal range and within 180 days  ?  Creatinine, Ser  ?Date Value Ref Range Status  ?04/25/2020 0.74 0.44 - 1.00 mg/dL Final  ?  ?  ?  ?  Passed - Last BP in normal range  ?  BP Readings from Last 1 Encounters:  ?07/13/21 123/83  ?  ?  ?  ?  Passed  - Valid encounter within last 6 months  ?  Recent Outpatient Visits   ? ?      ? 1 month ago Essential hypertension  ? Primary Care at [facility], [place]  ? ?  ?  ? ?  ?  ?  ? ?

## 2021-08-25 ENCOUNTER — Other Ambulatory Visit: Payer: Self-pay | Admitting: *Deleted

## 2021-08-25 MED ORDER — GLIMEPIRIDE 2 MG PO TABS: 2.0000 mg | ORAL_TABLET | Freq: Every day | ORAL | 0 refills | Status: DC

## 2021-08-25 MED ORDER — LOSARTAN POTASSIUM 25 MG PO TABS: 25.0000 mg | ORAL_TABLET | Freq: Every day | ORAL | 0 refills | Status: DC

## 2021-08-25 MED ORDER — TRIAMTERENE-HCTZ 37.5-25 MG PO CAPS: 1.0000 | ORAL_CAPSULE | Freq: Every day | ORAL | 0 refills | Status: DC

## 2021-08-26 ENCOUNTER — Other Ambulatory Visit: Payer: Self-pay | Admitting: Family Medicine

## 2021-08-26 NOTE — Telephone Encounter (Signed)
Pharmacy advised that these two medications are showing as too soon to refill per insurance. Pt stated this is incorrect and she has no more medication. ? ?Last filled 11/19 losartan (COZAAR) 25 MG tablet ? ?Last filled 11/19 glimepiride (AMARYL) 2 MG tablet ? ?Pt is requesting a follow up call . ?

## 2021-08-26 NOTE — Telephone Encounter (Deleted)
Medication Refill - Medication: ***  Has the patient contacted their pharmacy? {yes no:[id number]} (Agent: If no, request that the patient contact the pharmacy for the refill. If patient does not wish to contact the pharmacy document the reason why and proceed with request.) (Agent: If yes, when and what did the pharmacy advise?)  Preferred Pharmacy (with phone number or street name): *** Has the patient been seen for an appointment in the last year OR does the patient have an upcoming appointment? {yes no:[id number]}  Agent: Please be advised that RX refills may take up to 3 business days. We ask that you follow-up with your pharmacy.  

## 2021-08-26 NOTE — Telephone Encounter (Signed)
Medication Refill - Medication: insulin detemir (LEVEMIR) 100 UNIT/ML injection  ? ?Has the patient contacted their pharmacy? Yes.   Send different prescriptions for medication per pharmacy because the pen has been changed. ? ?(Agent: If yes, when and what did the pharmacy advise?) ? ?Preferred Pharmacy (with phone number or street name):  ?[id number] [place]  ?[id number] ?Phone: [phone]: [phone] ?Hours: Not open 24 hours  ? ? ?Has the patient been seen for an appointment in the last year OR does the patient have an upcoming appointment? Yes.   ? ?Agent: Please be advised that RX refills may take up to 3 business days. We ask that you follow-up with your pharmacy.  ?

## 2021-08-27 MED ORDER — INSULIN DETEMIR 100 UNIT/ML ~~LOC~~ SOLN: 45.0000 [IU] | SUBCUTANEOUS | 2 refills | Status: DC

## 2021-08-27 NOTE — Telephone Encounter (Signed)
Requested Prescriptions  ?Pending Prescriptions Disp Refills  ?? insulin detemir (LEVEMIR) 100 UNIT/ML injection 10 mL 2  ?  Sig: Inject 0.45 mLs (45 Units total) into the skin every morning.  ?  ? Endocrinology:  Diabetes - Insulins Failed - 08/26/2021  2:01 PM  ?  ?  Failed - HBA1C is between 0 and 7.9 and within 180 days  ?  Hgb A1c MFr Bld  ?Date Value Ref Range Status  ?04/25/2020 6.4 (H) 4.8 - 5.6 % Final  ?  Comment:  ?  (NOTE) ?Pre diabetes:          5.7%-6.4% ? ?Diabetes:              >6.4% ? ?Glycemic control for   <7.0% ?adults with diabetes ?  ?   ?  ?  Passed - Valid encounter within last 6 months  ?  Recent Outpatient Visits   ?      ? 1 month ago Essential hypertension  ? Primary Care at [facility], [place]  ?  ?  ? ?  ?  ?  ? ? ?

## 2021-08-31 ENCOUNTER — Ambulatory Visit (INDEPENDENT_AMBULATORY_CARE_PROVIDER_SITE_OTHER): Payer: Medicare HMO

## 2021-08-31 ENCOUNTER — Ambulatory Visit
Admission: EM | Admit: 2021-08-31 | Discharge: 2021-08-31 | Disposition: A | Discharge status: Home / Self Care | Payer: Medicare HMO | Attending: Internal Medicine | Admitting: Internal Medicine

## 2021-08-31 DIAGNOSIS — R051 Acute cough: Secondary | ICD-10-CM

## 2021-08-31 DIAGNOSIS — R059 Cough, unspecified: Secondary | ICD-10-CM | POA: Diagnosis not present

## 2021-08-31 DIAGNOSIS — J069 Acute upper respiratory infection, unspecified: Secondary | ICD-10-CM | POA: Diagnosis not present

## 2021-08-31 MED ORDER — GUAIFENESIN 200 MG PO TABS: 200.0000 mg | ORAL_TABLET | ORAL | 0 refills | Status: DC | PRN

## 2021-08-31 MED ORDER — DOXYCYCLINE HYCLATE 100 MG PO CAPS: 100.0000 mg | ORAL_CAPSULE | Freq: Two times a day (BID) | ORAL | 0 refills | Status: DC

## 2021-08-31 NOTE — ED Triage Notes (Signed)
Patient presents to Urgent Care with complaints of uri with cough and congestion since last Monday. Patient reports fatigue. Pt has been taking otc cold medications to help and nyquill for sleep. ? ?

## 2021-08-31 NOTE — Discharge Instructions (Signed)
Your chest x-ray was normal.  You have been prescribed 2 medications to help alleviate symptoms.  Please follow-up if symptoms persist or worsen. ?

## 2021-08-31 NOTE — ED Provider Notes (Addendum)
[facility]    CSN: [id number] Arrival date & time: 08/31/21  8295      History   Chief Complaint Chief Complaint  Patient presents with   chets congestion     HPI LUZMA ENEA is a 66 y.o. female.   Patient presents with nasal congestion, cough, chest congestion that started approximately 1 week ago.  Denies any known sick contacts.  Patient does report that she felt feverish when symptoms first started but has not had a fever since.  Patient has taken NyQuil and DayQuil with minimal improvement of symptoms.  Denies chest pain, shortness of breath, sore throat, ear pain, nausea, vomiting, diarrhea, abdominal pain.  Patient does report history of asthma and history of being a smoker.  Does not currently smoke.    Past Medical History:  Diagnosis Date   Anxiety    Asthma    When ill   Cervical radiculopathy    Chronic pain syndrome    Depression    GERD (gastroesophageal reflux disease)    Hyperlipidemia    Hypertension    Peptic ulcer    Type 2 diabetes mellitus (HCC)     There are no problems to display for this patient.   Past Surgical History:  Procedure Laterality Date   BUNIONECTOMY     CERVICAL DISC SURGERY     CHOLECYSTECTOMY     DIAGNOSTIC LAPAROSCOPY  1980   endometriosis   INGUINAL HERNIA REPAIR Left 05/02/2020   Procedure: OPEN LEFT INGUINAL HERNIA REPAIR WITH MESH, TAP BLOCK;  Surgeon: Berna Bue, MD;  Location: [facility];  Service: General;  Laterality: Left;   TUBAL LIGATION  1990s    OB History   No obstetric history on file.      Home Medications    Prior to Admission medications   Medication Sig Start Date End Date Taking? Authorizing Provider  doxycycline (VIBRAMYCIN) 100 MG capsule Take 1 capsule (100 mg total) by mouth 2 (two) times daily. 08/31/21  Yes Felice Deem, Rolly Salter E, FNP  guaiFENesin 200 MG tablet Take 1 tablet (200 mg total) by mouth every 4 (four) hours as needed for cough or to loosen phlegm. 08/31/21  Yes  Asiah Befort, Acie Fredrickson, FNP  albuterol (VENTOLIN HFA) 108 (90 Base) MCG/ACT inhaler Inhale 2 puffs into the lungs every 6 (six) hours as needed for wheezing or shortness of breath.     [provider]  Alcohol Swabs PADS USE TO TAKE BLOOD SUGAR 2 TIMES A DAY 07/27/21   Georganna Skeans, MD  cholecalciferol (VITAMIN D3) 25 MCG (1000 UNIT) tablet Take 1,000 Units by mouth daily.    [provider]  escitalopram (LEXAPRO) 20 MG tablet Take 20 mg by mouth daily.    [provider]  esomeprazole (NEXIUM) 40 MG capsule Take 1 capsule (40 mg total) by mouth in the morning. 12/25/19   Meryl Dare, MD  glimepiride (AMARYL) 2 MG tablet Take 1 tablet (2 mg total) by mouth daily with breakfast. 08/25/21   Georganna Skeans, MD  glucose blood (TRUE METRIX BLOOD GLUCOSE TEST) test strip Use as instructed 07/27/21   Georganna Skeans, MD  glucose blood test strip Accu-Chek SmartView Test Strips    [provider]  insulin detemir (LEVEMIR) 100 UNIT/ML injection Inject 0.45 mLs (45 Units total) into the skin every morning. 08/27/21   Georganna Skeans, MD  losartan (COZAAR) 25 MG tablet Take 1 tablet (25 mg total) by mouth daily. 08/25/21   Georganna Skeans, MD  lovastatin (MEVACOR) 10 MG tablet Take 1 tablet (10 mg total) by mouth at bedtime. 07/27/21   Georganna Skeans, MD  metFORMIN (GLUCOPHAGE) 500 MG tablet Take 1,000 mg by mouth 2 (two) times daily with a meal.    [provider]  metFORMIN (GLUCOPHAGE-XR) 500 MG 24 hr tablet Take 2 tablets (1,000 mg total) by mouth daily with breakfast. 08/14/21   Georganna Skeans, MD  Misc. Devices MISC TRUE MATRIX METER 07/27/21   Georganna Skeans, MD  Semaglutide,0.25 or 0.5MG /DOS, (OZEMPIC, 0.25 OR 0.5 MG/DOSE,) 2 MG/1.5ML SOPN Inject 1 mg into the skin once a week.    [provider]  triamterene-hydrochlorothiazide (DYAZIDE) 37.5-25 MG capsule Take 1 each (1 capsule total) by mouth daily. 08/25/21   Georganna Skeans, MD  TRUEplus Lancets 33G MISC  USE TO TAKE BLOOD SUGAR 2 TIMES A DAY 07/27/21   Georganna Skeans, MD    Family History Family History  Problem Relation Age of Onset   Heart attack Mother    Asthma Mother    Heart disease Father    Stomach cancer Paternal Aunt    Colon cancer Paternal Aunt     Social History Social History   Tobacco Use   Smoking status: Former    Packs/day: 1.00    Years: 15.00    Pack years: 15.00    Types: Cigarettes    Quit date: 2017    Years since quitting: 6.2   Smokeless tobacco: Never  Vaping Use   Vaping Use: Never used  Substance Use Topics   Alcohol use: Never   Drug use: Never     Allergies   Atenolol and Codeine   Review of Systems Review of Systems Per HPI  Physical Exam Triage Vital Signs ED Triage Vitals  Enc Vitals Group     BP 08/31/21 0830 123/68     Pulse Rate 08/31/21 0830 83     Resp 08/31/21 0830 20     Temp 08/31/21 0830 98 F (36.7 C)     Temp Source 08/31/21 0830 Oral     SpO2 08/31/21 0830 95 %     Weight 08/31/21 0828 225 lb (102.1 kg)     Height 08/31/21 0828 5' 6.75" (1.695 m)     Head Circumference --      Peak Flow --      Pain Score 08/31/21 0828 0     Pain Loc --      Pain Edu? --      Excl. in GC? --    No data found.  Updated Vital Signs BP 123/68 (BP Location: Left Arm)   Pulse 83   Temp 98 F (36.7 C) (Oral)   Resp 20   Ht 5' 6.75" (1.695 m)   Wt 225 lb (102.1 kg)   SpO2 95%   BMI 35.50 kg/m   Visual Acuity Right Eye Distance:   Left Eye Distance:   Bilateral Distance:    Right Eye Near:   Left Eye Near:    Bilateral Near:     Physical Exam Constitutional:      General: She is not in acute distress.    Appearance: Normal appearance. She is not toxic-appearing or diaphoretic.  HENT:     Head: Normocephalic and atraumatic.     Right Ear: Tympanic membrane and ear canal normal.     Left Ear: Tympanic membrane and ear canal normal.     Nose: Congestion present.     Mouth/Throat:  Mouth: Mucous membranes  are moist.     Pharynx: No posterior oropharyngeal erythema.  Eyes:     Extraocular Movements: Extraocular movements intact.     Conjunctiva/sclera: Conjunctivae normal.     Pupils: Pupils are equal, round, and reactive to light.  Cardiovascular:     Rate and Rhythm: Normal rate and regular rhythm.     Pulses: Normal pulses.     Heart sounds: Normal heart sounds.  Pulmonary:     Effort: Pulmonary effort is normal. No respiratory distress.     Breath sounds: No stridor. Rhonchi present. No wheezing or rales.  Abdominal:     General: Abdomen is flat. Bowel sounds are normal.     Palpations: Abdomen is soft.  Musculoskeletal:        General: Normal range of motion.     Cervical back: Normal range of motion.  Skin:    General: Skin is warm and dry.  Neurological:     General: No focal deficit present.     Mental Status: She is alert and oriented to person, place, and time. Mental status is at baseline.  Psychiatric:        Mood and Affect: Mood normal.        Behavior: Behavior normal.     UC Treatments / Results  Labs (all labs ordered are listed, but only abnormal results are displayed) Labs Reviewed - No data to display  EKG   Radiology DG Chest 2 View  Result Date: 08/31/2021 CLINICAL DATA:  Cough EXAM: CHEST - 2 VIEW COMPARISON:  None. FINDINGS: Cardiac silhouette and mediastinal contours are within normal limits. Mild to moderate calcification within aortic arch. The lungs are clear. No pleural effusion or pneumothorax. ACDF hardware overlies the lower cervical spine. Mild-to-moderate multilevel degenerative disc changes of the thoracic spine. Cholecystectomy clips. IMPRESSION: No active cardiopulmonary disease. Electronically Signed   By: Neita Garnet M.D.   On: 08/31/2021 09:07    Procedures Procedures (including critical care time)  Medications Ordered in UC Medications - No data to display  Initial Impression / Assessment and Plan / UC Course  I have reviewed  the triage vital signs and the nursing notes.  Pertinent labs & imaging results that were available during my care of the patient were reviewed by me and considered in my medical decision making (see chart for details).     Will treat with doxycycline due to duration of symptoms and rhonchi noted on exam.  Chest x-ray was negative for any acute cardiopulmonary process.  Patient is not in any acute distress and no need for emergent referral to the hospital at this time.  Guaifenesin to treat chest congestion.  Will defer prednisone at this time given patient takes multiple medications for diabetes and states that it typically makes her blood sugar increase.  Discussed strict return and ER precautions.  Discussed supportive care and symptom management with patient.  Patient verbalized understanding and was agreeable with plan. Dicussed the calcification of aortic arch with patient and advised patient to follow up with PCP for this.  Final Clinical Impressions(s) / UC Diagnoses   Final diagnoses:  Acute upper respiratory infection  Acute cough     Discharge Instructions      Your chest x-ray was normal.  You have been prescribed 2 medications to help alleviate symptoms.  Please follow-up if symptoms persist or worsen.    ED Prescriptions     Medication Sig Dispense Auth. Provider   doxycycline (VIBRAMYCIN) 100  MG capsule Take 1 capsule (100 mg total) by mouth 2 (two) times daily. 20 capsule [place], [place] E, [place]   guaiFENesin 200 MG tablet Take 1 tablet (200 mg total) by mouth every 4 (four) hours as needed for cough or to loosen phlegm. 30 suppository Alex[place], Acie Fredrickson, [place]      PDMP not reviewed this encounter.   Gustavus Bryant, [place] 08/31/21 0928    Gustavus Bryant, [place] 08/31/21 [phone]

## 2021-10-08 ENCOUNTER — Other Ambulatory Visit: Payer: Self-pay | Admitting: Family Medicine

## 2021-10-23 ENCOUNTER — Other Ambulatory Visit: Payer: Self-pay | Admitting: *Deleted

## 2021-10-23 MED ORDER — GLIMEPIRIDE 2 MG PO TABS: 2.0000 mg | ORAL_TABLET | Freq: Every day | ORAL | 0 refills | Status: DC

## 2021-10-23 MED ORDER — TRIAMTERENE-HCTZ 37.5-25 MG PO CAPS: 1.0000 | ORAL_CAPSULE | Freq: Every day | ORAL | 0 refills | Status: DC

## 2021-10-23 MED ORDER — LOSARTAN POTASSIUM 25 MG PO TABS: 25.0000 mg | ORAL_TABLET | Freq: Every day | ORAL | 0 refills | Status: DC

## 2021-10-23 NOTE — Progress Notes (Signed)
Refill sent to pharmacy  Scheduled Meds:   triamterene-hydrochlorothiazide (DYAZIDE) 37.5-25 MG capsule [[id number]]   losartan (COZAAR) 25 MG tablet [[id number]]    glimepiride (AMARYL) 2 MG tablet [[id number]]

## 2021-11-09 ENCOUNTER — Other Ambulatory Visit: Payer: Self-pay | Admitting: Family Medicine

## 2021-11-09 ENCOUNTER — Other Ambulatory Visit: Payer: Self-pay | Admitting: *Deleted

## 2021-11-09 NOTE — Telephone Encounter (Signed)
Copied from [place] [id number]. Topic: General - Other >> Nov 09, 2021 11:58 AM Everette C wrote: Reason for CRM: Medication Refill - Medication: escitalopram (LEXAPRO) 20 MG tablet [[id number]]   lovastatin (MEVACOR) 10 MG tablet [[id number]]   insulin detemir (LEVEMIR) 100 UNIT/ML injection [[id number]]   Semaglutide,0.25 or 0.'5MG'$ /DOS, (OZEMPIC, 0.25 OR 0.5 MG/DOSE,) 2 MG/1.5ML SOPN [[id number]]   Has the patient contacted their pharmacy? Yes.  The patient's pharmacy has made contact on their behalf  (Agent: If no, request that the patient contact the pharmacy for the refill. If patient does not wish to contact the pharmacy document the reason why and proceed with request.) (Agent: If yes, when and what did the pharmacy advise?)  Preferred Pharmacy (with phone number or street name): [place], [place] [address] Phone: [phone] Fax: [phone] Hours: Not open 24 hours   Has the patient been seen for an appointment in the last year OR does the patient have an upcoming appointment? Yes.    Agent: Please be advised that RX refills may take up to 3 business days. We ask that you follow-up with your pharmacy.

## 2021-11-10 ENCOUNTER — Encounter: Payer: Self-pay | Admitting: Family Medicine

## 2021-11-10 ENCOUNTER — Ambulatory Visit (INDEPENDENT_AMBULATORY_CARE_PROVIDER_SITE_OTHER): Payer: Medicare HMO | Admitting: Family Medicine

## 2021-11-10 VITALS — BP 112/72 | HR 79 | Temp 98.1°F | Resp 16 | Ht 66.0 in | Wt 238.8 lb

## 2021-11-10 DIAGNOSIS — E1169 Type 2 diabetes mellitus with other specified complication: Secondary | ICD-10-CM

## 2021-11-10 DIAGNOSIS — E785 Hyperlipidemia, unspecified: Secondary | ICD-10-CM

## 2021-11-10 DIAGNOSIS — I10 Essential (primary) hypertension: Secondary | ICD-10-CM | POA: Diagnosis not present

## 2021-11-10 DIAGNOSIS — Z6838 Body mass index (BMI) 38.0-38.9, adult: Secondary | ICD-10-CM

## 2021-11-10 NOTE — Progress Notes (Signed)
Patient is her for f/u.dm

## 2021-11-12 ENCOUNTER — Encounter: Payer: Self-pay | Admitting: Family Medicine

## 2021-11-12 NOTE — Progress Notes (Signed)
Established Patient Office Visit  Subjective    Patient ID: Amy Garner, female    DOB: 03-29-1956  Age: 66 y.o. MRN: [id number]  CC:  Chief Complaint  Patient presents with   Follow-up    HPI Amy Garner presents for routine follow up of chronic med issues including diabetes and hypertension. Patient denies acute complaints or concerns.    Outpatient Encounter Medications as of 11/10/2021  Medication Sig   albuterol (VENTOLIN HFA) 108 (90 Base) MCG/ACT inhaler Inhale 2 puffs into the lungs every 6 (six) hours as needed for wheezing or shortness of breath.    Alcohol Swabs PADS USE TO TAKE BLOOD SUGAR 2 TIMES A DAY   cholecalciferol (VITAMIN D3) 25 MCG (1000 UNIT) tablet Take 1,000 Units by mouth daily.   doxycycline (VIBRAMYCIN) 100 MG capsule Take 1 capsule (100 mg total) by mouth 2 (two) times daily.   escitalopram (LEXAPRO) 20 MG tablet Take 20 mg by mouth daily.   esomeprazole (NEXIUM) 40 MG capsule Take 1 capsule (40 mg total) by mouth in the morning.   glimepiride (AMARYL) 2 MG tablet Take 1 tablet (2 mg total) by mouth daily with breakfast.   glucose blood (TRUE METRIX BLOOD GLUCOSE TEST) test strip Use as instructed   glucose blood test strip Accu-Chek SmartView Test Strips   guaiFENesin 200 MG tablet Take 1 tablet (200 mg total) by mouth every 4 (four) hours as needed for cough or to loosen phlegm.   insulin detemir (LEVEMIR) 100 UNIT/ML injection Inject 0.45 mLs (45 Units total) into the skin every morning.   losartan (COZAAR) 25 MG tablet Take 1 tablet (25 mg total) by mouth daily.   lovastatin (MEVACOR) 10 MG tablet TAKE 1 TABLET BY MOUTH EVERYDAY AT BEDTIME   metFORMIN (GLUCOPHAGE) 500 MG tablet Take 1,000 mg by mouth 2 (two) times daily with a meal.   metFORMIN (GLUCOPHAGE-XR) 500 MG 24 hr tablet TAKE 2 TABLETS BY MOUTH EVERY DAY WITH BREAKFAST   Misc. Devices MISC TRUE MATRIX METER   Semaglutide,0.25 or 0.'5MG'$ /DOS, (OZEMPIC, 0.25 OR 0.5 MG/DOSE,) 2  MG/1.5ML SOPN Inject 1 mg into the skin once a week.   triamterene-hydrochlorothiazide (DYAZIDE) 37.5-25 MG capsule Take 1 each (1 capsule total) by mouth daily.   TRUEplus Lancets 33G MISC USE TO TAKE BLOOD SUGAR 2 TIMES A DAY   No facility-administered encounter medications on file as of 11/10/2021.    Past Medical History:  Diagnosis Date   Anxiety    Asthma    When ill   Cervical radiculopathy    Chronic pain syndrome    Depression    GERD (gastroesophageal reflux disease)    Hyperlipidemia    Hypertension    Peptic ulcer    Type 2 diabetes mellitus ([place])     Past Surgical History:  Procedure Laterality Date   BUNIONECTOMY     CERVICAL DISC SURGERY     CHOLECYSTECTOMY     DIAGNOSTIC LAPAROSCOPY  1980   endometriosis   INGUINAL HERNIA REPAIR Left 05/02/2020   Procedure: OPEN LEFT INGUINAL HERNIA REPAIR WITH MESH, TAP BLOCK;  Surgeon: Clovis Riley, MD;  Location: [facility];  Service: General;  Laterality: Left;   TUBAL LIGATION  1990s    Family History  Problem Relation Age of Onset   Heart attack Mother    Asthma Mother    Heart disease Father    Stomach cancer Paternal Aunt    Colon cancer Paternal Aunt  Social History   Socioeconomic History   Marital status: Married    Spouse name: Not on file   Number of children: Not on file   Years of education: Not on file   Highest education level: Not on file  Occupational History   Not on file  Tobacco Use   Smoking status: Former    Packs/day: 1.00    Years: 15.00    Total pack years: 15.00    Types: Cigarettes    Quit date: 2017    Years since quitting: 6.4   Smokeless tobacco: Never  Vaping Use   Vaping Use: Never used  Substance and Sexual Activity   Alcohol use: Never   Drug use: Never   Sexual activity: Not on file  Other Topics Concern   Not on file  Social History Narrative   Not on file   Social Determinants of Health   Financial Resource Strain: Not on file  Food Insecurity: Not on  file  Transportation Needs: Not on file  Physical Activity: Not on file  Stress: Not on file  Social Connections: Not on file  Intimate Partner Violence: Not on file    Review of Systems  All other systems reviewed and are negative.       Objective    BP 112/72   Pulse 79   Temp 98.1 F (36.7 C) (Oral)   Resp 16   Ht '5\' 6"'$  (1.676 m)   Wt 238 lb 12.8 oz (108.3 kg)   SpO2 93%   BMI 38.54 kg/m   Physical Exam Vitals and nursing note reviewed.  Constitutional:      General: She is not in acute distress. Cardiovascular:     Rate and Rhythm: Normal rate and regular rhythm.  Pulmonary:     Effort: Pulmonary effort is normal.     Breath sounds: Normal breath sounds.  Abdominal:     Palpations: Abdomen is soft.     Tenderness: There is no abdominal tenderness.  Musculoskeletal:     Right lower leg: No edema.     Left lower leg: No edema.  Neurological:     General: No focal deficit present.     Mental Status: She is alert and oriented to person, place, and time.         Assessment & Plan:   1. Essential hypertension Appears stable with present management. Continue and monitor  2. Type 2 diabetes mellitus with other specified complication, without long-term current use of insulin (HCC) Continue present management and monitor  3. Hyperlipidemia, unspecified hyperlipidemia type Continue present management  4. Class 2 severe obesity due to excess calories with serious comorbidity and body mass index (BMI) of 38.0 to 38.9 in adult [facility]) Discussed dietary and activity options.      No follow-ups on file.   Becky Sax, MD

## 2021-11-24 ENCOUNTER — Other Ambulatory Visit: Payer: Self-pay | Admitting: *Deleted

## 2021-11-24 ENCOUNTER — Telehealth: Payer: Self-pay | Admitting: *Deleted

## 2021-11-24 MED ORDER — LOVASTATIN 10 MG PO TABS: ORAL_TABLET | ORAL | 0 refills | Status: DC

## 2021-11-24 MED ORDER — INSULIN DETEMIR 100 UNIT/ML ~~LOC~~ SOLN: 45.0000 [IU] | SUBCUTANEOUS | 2 refills | Status: DC

## 2021-11-24 MED ORDER — ESOMEPRAZOLE MAGNESIUM 40 MG PO CPDR: 40.0000 mg | DELAYED_RELEASE_CAPSULE | Freq: Every morning | ORAL | 1 refills | Status: DC

## 2021-11-24 NOTE — Telephone Encounter (Signed)
Semaglutide,0.25 or 0.'5MG'$ /DOS, (OZEMPIC, 0.25 OR 0.5 MG/DOSE,) 2 MG/1.5ML SOPN   escitalopram (LEXAPRO) 20 MG tablet 20 mg, Daily    All rx should go to [place] [facility] now per patient

## 2021-11-30 ENCOUNTER — Other Ambulatory Visit: Payer: Self-pay | Admitting: *Deleted

## 2021-11-30 ENCOUNTER — Other Ambulatory Visit: Payer: Self-pay | Admitting: Family Medicine

## 2021-11-30 MED ORDER — ESCITALOPRAM OXALATE 20 MG PO TABS: 20.0000 mg | ORAL_TABLET | Freq: Every day | ORAL | 2 refills | Status: DC

## 2021-11-30 MED ORDER — OZEMPIC (0.25 OR 0.5 MG/DOSE) 2 MG/1.5ML ~~LOC~~ SOPN: 1.0000 mg | PEN_INJECTOR | SUBCUTANEOUS | 2 refills | Status: DC

## 2021-11-30 NOTE — Telephone Encounter (Signed)
   Notes to clinic:  Pharmacy called to say the Ozempic rx that you ordered will not be able to distribute '1mg'$ . If you want a dose of '1mg'$  the '4mg'$ /6m would need to be ordered.      Requested Prescriptions  Pending Prescriptions Disp Refills   Semaglutide,0.25 or 0.'5MG'$ /DOS, (OZEMPIC, 0.25 OR 0.5 MG/DOSE,) 2 MG/1.5ML SOPN 1.5 mL 2    Sig: Inject 1 mg into the skin once a week.     Endocrinology:  Diabetes - GLP-1 Receptor Agonists - semaglutide Failed - 11/30/2021  2:23 PM      Failed - HBA1C in normal range and within 180 days    Hgb A1c MFr Bld  Date Value Ref Range Status  04/25/2020 6.4 (H) 4.8 - 5.6 % Final    Comment:    (NOTE) Pre diabetes:          5.7%-6.4%  Diabetes:              >6.4%  Glycemic control for   <7.0% adults with diabetes          Failed - Cr in normal range and within 360 days    Creatinine, Ser  Date Value Ref Range Status  04/25/2020 0.74 0.44 - 1.00 mg/dL Final         Passed - Valid encounter within last 6 months    Recent Outpatient Visits           2 weeks ago Essential hypertension   [facility]   4 months [facility]       Future Appointments             [facility] 5 [facility]

## 2021-11-30 NOTE — Telephone Encounter (Signed)
Rx for Ozempic is for '1mg'$  but the one you ordered can not give '1mg'$ . The '4mg'$ /34m can give '1mg'$  dose. Will route as request.

## 2021-12-03 ENCOUNTER — Other Ambulatory Visit: Payer: Self-pay | Admitting: Family Medicine

## 2021-12-03 MED ORDER — SEMAGLUTIDE (1 MG/DOSE) 4 MG/3ML ~~LOC~~ SOPN: 1.0000 mg | PEN_INJECTOR | SUBCUTANEOUS | 2 refills | Status: DC

## 2022-01-17 ENCOUNTER — Other Ambulatory Visit: Payer: Self-pay | Admitting: Family Medicine

## 2022-01-19 NOTE — Telephone Encounter (Signed)
Requested medication (s) are due for refill today: yes  Requested medication (s) are on the active medication list: yes  Last refill:  see med list  Future visit scheduled: yes  Notes to clinic:  Unable to refill per protocol due to failed labs, no updated results.      Requested Prescriptions  Pending Prescriptions Disp Refills   metFORMIN (GLUCOPHAGE-XR) 500 MG 24 hr tablet [Pharmacy Med Name: METFORMIN HYDROCHLORIDE ER 500 MG Tablet Extended Release 24 Hour] 180 tablet 1    Sig: TAKE 2 TABLETS EVERY DAY WITH BREAKFAST     Endocrinology:  Diabetes - Biguanides Failed - 01/17/2022  2:10 AM      Failed - Cr in normal range and within 360 days    Creatinine, Ser  Date Value Ref Range Status  04/25/2020 0.74 0.44 - 1.00 mg/dL Final         Failed - HBA1C is between 0 and 7.9 and within 180 days    Hgb A1c MFr Bld  Date Value Ref Range Status  04/25/2020 6.4 (H) 4.8 - 5.6 % Final    Comment:    (NOTE) Pre diabetes:          5.7%-6.4%  Diabetes:              >6.4%  Glycemic control for   <7.0% adults with diabetes          Failed - eGFR in normal range and within 360 days    GFR, Estimated  Date Value Ref Range Status  04/25/2020 >60 >60 mL/min Final    Comment:    (NOTE) Calculated using the CKD-EPI Creatinine Equation (2021)    GFR  Date Value Ref Range Status  12/25/2019 59.72 (L) >60.00 mL/min Final         Failed - B12 Level in normal range and within 720 days    No results found for: "VITAMINB12"       Failed - CBC within normal limits and completed in the last 12 months    WBC  Date Value Ref Range Status  04/25/2020 9.7 4.0 - 10.5 K/uL Final   RBC  Date Value Ref Range Status  04/25/2020 4.50 3.87 - 5.11 MIL/uL Final   Hemoglobin  Date Value Ref Range Status  04/25/2020 12.9 12.0 - 15.0 g/dL Final   HCT  Date Value Ref Range Status  04/25/2020 40.3 36.0 - 46.0 % Final   MCHC  Date Value Ref Range Status  04/25/2020 32.0 30.0 - 36.0 g/dL  Final   [facility]  Date Value Ref Range Status  04/25/2020 28.7 26.0 - 34.0 pg Final   MCV  Date Value Ref Range Status  04/25/2020 89.6 80.0 - 100.0 fL Final   No results found for: "PLTCOUNTKUC", "LABPLAT", "POCPLA" RDW  Date Value Ref Range Status  04/25/2020 13.2 11.5 - 15.5 % Final         Passed - Valid encounter within last 6 months    Recent Outpatient Visits           2 months ago Essential hypertension   Primary Care at [facility], MD   6 months ago Essential hypertension   Primary Care at [facility], MD       Future Appointments             In 3 months Dorna Mai, MD Primary Care at [facility]  lovastatin (MEVACOR) 10 MG tablet [Pharmacy Med Name: LOVASTATIN 10 MG Tablet] 90 tablet 0    Sig: TAKE 1 TABLET AT BEDTIME     Cardiovascular:  Antilipid - Statins 2 Failed - 01/17/2022  2:10 AM      Failed - Cr in normal range and within 360 days    Creatinine, Ser  Date Value Ref Range Status  04/25/2020 0.74 0.44 - 1.00 mg/dL Final         Failed - Lipid Panel in normal range within the last 12 months    No results found for: "CHOL", "POCCHOL", "CHOLTOT" No results found for: "LDLCALC", "LDLC", "HIRISKLDL", "POCLDL", "LDLDIRECT", "REALLDLC", "TOTLDLC" No results found for: "HDL", "POCHDL" No results found for: "TRIG", "POCTRIG"       Passed - Patient is not pregnant      Passed - Valid encounter within last 12 months    Recent Outpatient Visits           2 months ago Essential hypertension   Primary Care at [place] Wilson, Amelia, MD   6 months ago Essential hypertension   Primary Care at [place] Wilson, Amelia, MD       Future Appointments             In 3 months Wilson, Amelia, MD Primary Care at [place]             triamterene-hydrochlorothiazide (DYAZIDE) 37.5-25 MG capsule [Pharmacy Med Name: TRIAMTERENE/HYDROCHLOROTHIAZIDE 37.5-25 MG Capsule] 90 capsule 0     Sig: TAKE 1 CAPSULE EVERY DAY     Cardiovascular: Diuretic Combos Failed - 01/17/2022  2:10 AM      Failed - K in normal range and within 180 days    Potassium  Date Value Ref Range Status  04/25/2020 4.7 3.5 - 5.1 mmol/L Final         Failed - Na in normal range and within 180 days    Sodium  Date Value Ref Range Status  04/25/2020 138 135 - 145 mmol/L Final         Failed - Cr in normal range and within 180 days    Creatinine, Ser  Date Value Ref Range Status  04/25/2020 0.74 0.44 - 1.00 mg/dL Final         Passed - Last BP in normal range    BP Readings from Last 1 Encounters:  11/10/21 112/72         Passed - Valid encounter within last 6 months    Recent Outpatient Visits           2 months ago Essential hypertension   Primary Care at [place] Wilson, Amelia, MD   6 months ago Essential hypertension   Primary Care at [place] Wilson, Amelia, MD       Future Appointments             In 3 months Wilson, Amelia, MD Primary Care at [place]             

## 2022-01-21 ENCOUNTER — Other Ambulatory Visit: Payer: Self-pay | Admitting: Family Medicine

## 2022-02-11 ENCOUNTER — Other Ambulatory Visit: Payer: Self-pay | Admitting: Family Medicine

## 2022-02-11 NOTE — Telephone Encounter (Signed)
Requested medication (s) are due for refill today: yes  Requested medication (s) are on the active medication list: yes  Last refill:  11/30/21  Future visit scheduled:yes  Notes to clinic:  Unable to refill per protocol due to failed labs, no updated results.  No A1c since 2021.     Requested Prescriptions  Pending Prescriptions Disp Refills   OZEMPIC, 1 MG/DOSE, 4 MG/3ML SOPN [Pharmacy Med Name: OZEMPIC 4 MG/3ML Solution Pen-injector]      Sig: INJECT '1MG'$  UNDER THE SKIN ONE TIME WEEKLY     Endocrinology:  Diabetes - GLP-1 Receptor Agonists - semaglutide Failed - 02/11/2022 10:47 AM      Failed - HBA1C in normal range and within 180 days    Hgb A1c MFr Bld  Date Value Ref Range Status  04/25/2020 6.4 (H) 4.8 - 5.6 % Final    Comment:    (NOTE) Pre diabetes:          5.7%-6.4%  Diabetes:              >6.4%  Glycemic control for   <7.0% adults with diabetes          Failed - Cr in normal range and within 360 days    Creatinine, Ser  Date Value Ref Range Status  04/25/2020 0.74 0.44 - 1.00 mg/dL Final         Passed - Valid encounter within last 6 months    Recent Outpatient Visits           3 months ago Essential hypertension   Primary Care at [facility], MD   7 months ago Essential hypertension   Primary Care at [facility], MD       Future Appointments             In 2 months Dorna Mai, MD Primary Care at [facility]

## 2022-03-11 DIAGNOSIS — Q6211 Congenital occlusion of ureteropelvic junction: Secondary | ICD-10-CM | POA: Diagnosis not present

## 2022-03-31 ENCOUNTER — Other Ambulatory Visit: Payer: Self-pay | Admitting: Family Medicine

## 2022-03-31 NOTE — Telephone Encounter (Signed)
Unable to refill per protocol, request is too soon. Last refill 11/24/21 for 90 and 1 RF. Will refuse.  Requested Prescriptions  Pending Prescriptions Disp Refills   esomeprazole (NEXIUM) 40 MG capsule [Pharmacy Med Name: ESOMEPRAZOLE MAGNESIUM 40 MG Capsule Delayed Release] 90 capsule 10    Sig: TAKE 1 CAPSULE EVERY MORNING     Gastroenterology: Proton Pump Inhibitors 2 Failed - 03/31/2022  4:05 AM      Failed - ALT in normal range and within 360 days    ALT  Date Value Ref Range Status  12/25/2019 37 (H) 0 - 35 U/L Final         Failed - AST in normal range and within 360 days    AST  Date Value Ref Range Status  12/25/2019 28 0 - 37 U/L Final         Passed - Valid encounter within last 12 months    Recent Outpatient Visits           4 months ago Essential hypertension   Primary Care at [facility], Clyde Canterbury, MD   8 months ago Essential hypertension   Primary Care at [facility], Clyde Canterbury, MD       Future Appointments             In 1 month Dorna Mai, MD Primary Care at [facility]            Signed Prescriptions Disp Refills   losartan (COZAAR) 25 MG tablet 90 tablet 0    Sig: TAKE 1 TABLET EVERY DAY     Cardiovascular:  Angiotensin Receptor Blockers Failed - 03/31/2022  4:05 AM      Failed - Cr in normal range and within 180 days    Creatinine, Ser  Date Value Ref Range Status  04/25/2020 0.74 0.44 - 1.00 mg/dL Final         Failed - K in normal range and within 180 days    Potassium  Date Value Ref Range Status  04/25/2020 4.7 3.5 - 5.1 mmol/L Final         Passed - Patient is not pregnant      Passed - Last BP in normal range    BP Readings from Last 1 Encounters:  11/10/21 112/72         Passed - Valid encounter within last 6 months    Recent Outpatient Visits           4 months ago Essential hypertension   Primary Care at [facility], MD   8 months ago Essential hypertension   Primary Care at  [facility], MD       Future Appointments             In 1 month Dorna Mai, MD Primary Care at [facility]             glimepiride (AMARYL) 2 MG tablet 90 tablet 0    Sig: TAKE 1 [place]     Endocrinology:  Diabetes - Sulfonylureas Failed - 03/31/2022  4:05 AM      Failed - HBA1C is between 0 and 7.9 and within 180 days    Hgb A1c MFr Bld  Date Value Ref Range Status  04/25/2020 6.4 (H) 4.8 - 5.6 % Final    Comment:    (NOTE) Pre diabetes:          5.7%-6.4%  Diabetes:              >  6.4%  Glycemic control for   <7.0% adults with diabetes          Failed - Cr in normal range and within 360 days    Creatinine, Ser  Date Value Ref Range Status  04/25/2020 0.74 0.44 - 1.00 mg/dL Final         Passed - Valid encounter within last 6 months    Recent Outpatient Visits           4 months ago Essential hypertension   Primary Care at [facility], MD   8 months ago Essential hypertension   Primary Care at [facility], MD       Future Appointments             In 1 month Dorna Mai, MD Primary Care at [facility]

## 2022-03-31 NOTE — Telephone Encounter (Signed)
Requested Prescriptions  Pending Prescriptions Disp Refills   esomeprazole (NEXIUM) 40 MG capsule [Pharmacy Med Name: ESOMEPRAZOLE MAGNESIUM 40 MG Capsule Delayed Release] 90 capsule 10    Sig: TAKE 1 CAPSULE EVERY MORNING     Gastroenterology: Proton Pump Inhibitors 2 Failed - 03/31/2022  4:05 AM      Failed - ALT in normal range and within 360 days    ALT  Date Value Ref Range Status  12/25/2019 37 (H) 0 - 35 U/L Final         Failed - AST in normal range and within 360 days    AST  Date Value Ref Range Status  12/25/2019 28 0 - 37 U/L Final         Passed - Valid encounter within last 12 months    Recent Outpatient Visits           4 months ago Essential hypertension   Primary Care at [facility], Clyde Canterbury, MD   8 months ago Essential hypertension   Primary Care at [facility], Clyde Canterbury, MD       Future Appointments             In 1 month Dorna Mai, MD Primary Care at [facility]             losartan (COZAAR) 25 MG tablet [Pharmacy Med Name: LOSARTAN POTASSIUM 25 MG Tablet] 90 tablet 0    Sig: TAKE 1 TABLET EVERY DAY     Cardiovascular:  Angiotensin Receptor Blockers Failed - 03/31/2022  4:05 AM      Failed - Cr in normal range and within 180 days    Creatinine, Ser  Date Value Ref Range Status  04/25/2020 0.74 0.44 - 1.00 mg/dL Final         Failed - K in normal range and within 180 days    Potassium  Date Value Ref Range Status  04/25/2020 4.7 3.5 - 5.1 mmol/L Final         Passed - Patient is not pregnant      Passed - Last BP in normal range    BP Readings from Last 1 Encounters:  11/10/21 112/72         Passed - Valid encounter within last 6 months    Recent Outpatient Visits           4 months ago Essential hypertension   Primary Care at [facility], MD   8 months ago Essential hypertension   Primary Care at [facility], MD       Future Appointments             In 1 month  Dorna Mai, MD Primary Care at [facility]             glimepiride (AMARYL) 2 MG tablet [Pharmacy Med Name: GLIMEPIRIDE 2 MG Tablet] 90 tablet 0    Sig: TAKE 1 TABLET EVERY [place]     Endocrinology:  Diabetes - Sulfonylureas Failed - 03/31/2022  4:05 AM      Failed - HBA1C is between 0 and 7.9 and within 180 days    Hgb A1c MFr Bld  Date Value Ref Range Status  04/25/2020 6.4 (H) 4.8 - 5.6 % Final    Comment:    (NOTE) Pre diabetes:          5.7%-6.4%  Diabetes:              >6.4%  Glycemic control for   <7.0% adults with diabetes          Failed - Cr in normal range and within 360 days    Creatinine, Ser  Date Value Ref Range Status  04/25/2020 0.74 0.44 - 1.00 mg/dL Final         Passed - Valid encounter within last 6 months    Recent Outpatient Visits           4 months ago Essential hypertension   Primary Care at [facility], MD   8 months ago Essential hypertension   Primary Care at [facility], MD       Future Appointments             In 1 month Dorna Mai, MD Primary Care at [facility]

## 2022-04-09 ENCOUNTER — Other Ambulatory Visit: Payer: Self-pay | Admitting: Family Medicine

## 2022-04-12 NOTE — Telephone Encounter (Signed)
Requested medication (s) are due for refill today:   Yes  Requested medication (s) are on the active medication list:   Yes  Future visit scheduled:   Yes 05/06/2022   Last ordered: 02/19/2022 3 ml, 0 refills  Returned because labs are due per protocol.     Requested Prescriptions  Pending Prescriptions Disp Refills   OZEMPIC, 1 MG/DOSE, 4 MG/3ML SOPN [Pharmacy Med Name: OZEMPIC 4 MG/3ML Solution Pen-injector]  3    Sig: INJECT '1MG'$  UNDER THE SKIN ONE TIME WEEKLY     Endocrinology:  Diabetes - GLP-1 Receptor Agonists - semaglutide Failed - 04/09/2022  4:45 AM      Failed - HBA1C in normal range and within 180 days    Hgb A1c MFr Bld  Date Value Ref Range Status  04/25/2020 6.4 (H) 4.8 - 5.6 % Final    Comment:    (NOTE) Pre diabetes:          5.7%-6.4%  Diabetes:              >6.4%  Glycemic control for   <7.0% adults with diabetes          Failed - Cr in normal range and within 360 days    Creatinine, Ser  Date Value Ref Range Status  04/25/2020 0.74 0.44 - 1.00 mg/dL Final         Passed - Valid encounter within last 6 months    Recent Outpatient Visits           5 months ago Essential hypertension   Primary Care at [facility], MD   9 months ago Essential hypertension   Primary Care at [facility], MD       Future Appointments             In 3 weeks Dorna Mai, MD Primary Care at [facility]

## 2022-05-06 ENCOUNTER — Ambulatory Visit (INDEPENDENT_AMBULATORY_CARE_PROVIDER_SITE_OTHER): Payer: Medicare HMO | Admitting: Family Medicine

## 2022-05-06 ENCOUNTER — Encounter: Payer: Self-pay | Admitting: Family Medicine

## 2022-05-06 VITALS — BP 134/78 | HR 82 | Temp 98.1°F | Resp 16 | Wt 239.4 lb

## 2022-05-06 DIAGNOSIS — Z0001 Encounter for general adult medical examination with abnormal findings: Secondary | ICD-10-CM | POA: Diagnosis not present

## 2022-05-06 DIAGNOSIS — I10 Essential (primary) hypertension: Secondary | ICD-10-CM | POA: Diagnosis not present

## 2022-05-06 DIAGNOSIS — E6609 Other obesity due to excess calories: Secondary | ICD-10-CM

## 2022-05-06 DIAGNOSIS — Z6838 Body mass index (BMI) 38.0-38.9, adult: Secondary | ICD-10-CM | POA: Diagnosis not present

## 2022-05-06 DIAGNOSIS — Z0000 Encounter for general adult medical examination without abnormal findings: Secondary | ICD-10-CM

## 2022-05-06 DIAGNOSIS — K219 Gastro-esophageal reflux disease without esophagitis: Secondary | ICD-10-CM | POA: Diagnosis not present

## 2022-05-06 DIAGNOSIS — E1169 Type 2 diabetes mellitus with other specified complication: Secondary | ICD-10-CM

## 2022-05-06 DIAGNOSIS — E785 Hyperlipidemia, unspecified: Secondary | ICD-10-CM | POA: Diagnosis not present

## 2022-05-06 LAB — POCT GLYCOSYLATED HEMOGLOBIN (HGB A1C): Hemoglobin A1C: 8.9 % — AB (ref 4.0–5.6)

## 2022-05-06 NOTE — Progress Notes (Signed)
Subjective:   Amy Garner is a 66 y.o. female who presents for Medicare Annual (Subsequent) preventive examination.  Review of Systems    Refer to pcp       Objective:    There were no vitals filed for this visit. There is no height or weight on file to calculate BMI.     04/25/2020    9:31 AM  Advanced Directives  Does Patient Have a Medical Advance Directive? No  Would patient like information on creating a medical advance directive? Yes (MAU/Ambulatory/Procedural Areas - Information given)    Current Medications (verified) Outpatient Encounter Medications as of 05/06/2022  Medication Sig   albuterol (VENTOLIN HFA) 108 (90 Base) MCG/ACT inhaler Inhale 2 puffs into the lungs every 6 (six) hours as needed for wheezing or shortness of breath.    Alcohol Swabs PADS USE TO TAKE BLOOD SUGAR 2 TIMES A DAY   cholecalciferol (VITAMIN D3) 25 MCG (1000 UNIT) tablet Take 1,000 Units by mouth daily.   doxycycline (VIBRAMYCIN) 100 MG capsule Take 1 capsule (100 mg total) by mouth 2 (two) times daily.   escitalopram (LEXAPRO) 20 MG tablet Take 1 tablet (20 mg total) by mouth daily.   esomeprazole (NEXIUM) 40 MG capsule Take 1 capsule (40 mg total) by mouth in the morning.   glimepiride (AMARYL) 2 MG tablet TAKE 1 TABLET EVERY DAY WITH BREAKFAST   glucose blood (TRUE METRIX BLOOD GLUCOSE TEST) test strip Use as instructed   glucose blood test strip Accu-Chek SmartView Test Strips   guaiFENesin 200 MG tablet Take 1 tablet (200 mg total) by mouth every 4 (four) hours as needed for cough or to loosen phlegm.   LEVEMIR 100 UNIT/ML injection INJECT 45 UNITS UNDER THE SKIN EVERY MORNING   losartan (COZAAR) 25 MG tablet TAKE 1 TABLET EVERY DAY   lovastatin (MEVACOR) 10 MG tablet TAKE 1 TABLET AT BEDTIME   metFORMIN (GLUCOPHAGE-XR) 500 MG 24 hr tablet TAKE 2 TABLETS EVERY DAY WITH BREAKFAST   Misc. Devices MISC TRUE MATRIX METER   Semaglutide, 1 MG/DOSE, (OZEMPIC, 1 MG/DOSE,) 4 MG/3ML  SOPN INJECT 1MG UNDER THE SKIN ONE TIME WEEKLY   Semaglutide,0.25 or 0.5MG/DOS, (OZEMPIC, 0.25 OR 0.5 MG/DOSE,) 2 MG/1.5ML SOPN Inject 1 mg into the skin once a week.   triamterene-hydrochlorothiazide (DYAZIDE) 37.5-25 MG capsule TAKE 1 CAPSULE EVERY DAY   TRUEplus Lancets 33G MISC USE TO TAKE BLOOD SUGAR 2 TIMES A DAY   No facility-administered encounter medications on file as of 05/06/2022.    Allergies (verified) Atenolol and Codeine   History: Past Medical History:  Diagnosis Date   Anxiety    Asthma    When ill   Cervical radiculopathy    Chronic pain syndrome    Depression    GERD (gastroesophageal reflux disease)    Hyperlipidemia    Hypertension    Peptic ulcer    Type 2 diabetes mellitus ([place])    Past Surgical History:  Procedure Laterality Date   BUNIONECTOMY     CERVICAL DISC SURGERY     CHOLECYSTECTOMY     DIAGNOSTIC LAPAROSCOPY  1980   endometriosis   INGUINAL HERNIA REPAIR Left 05/02/2020   Procedure: OPEN LEFT INGUINAL HERNIA REPAIR WITH MESH, TAP BLOCK;  Surgeon: Clovis Riley, MD;  Location: [facility];  Service: General;  Laterality: Left;   TUBAL LIGATION  1990s   Family History  Problem Relation Age of Onset   Heart attack Mother    Asthma Mother  Heart disease Father    Stomach cancer Paternal Aunt    Colon cancer Paternal Aunt    Social History   Socioeconomic History   Marital status: Married    Spouse name: Not on file   Number of children: Not on file   Years of education: Not on file   Highest education level: Not on file  Occupational History   Not on file  Tobacco Use   Smoking status: Former    Packs/day: 1.00    Years: 15.00    Total pack years: 15.00    Types: Cigarettes    Quit date: 2017    Years since quitting: 6.9   Smokeless tobacco: Never  Vaping Use   Vaping Use: Never used  Substance and Sexual Activity   Alcohol use: Never   Drug use: Never   Sexual activity: Not on file  Other Topics Concern   Not on  file  Social History Narrative   Not on file   Social Determinants of Health   Financial Resource Strain: Not on file  Food Insecurity: Not on file  Transportation Needs: Not on file  Physical Activity: Not on file  Stress: Not on file  Social Connections: Not on file    Tobacco Counseling Counseling given: Not Answered   Clinical Intake:                 Diabetic?yes         Activities of Daily Living     No data to display           Patient Care Team: Dorna Mai, MD as PCP - General (Family Medicine)  Indicate any recent Medical Services you may have received from other than [facility] providers in the past year (date may be approximate).     Assessment:   This is a routine wellness examination for Amy Garner.  Hearing/Vision screen No results found.  Dietary issues and exercise activities discussed:     Goals Addressed   None   Depression Screen    07/13/2021    2:14 PM  PHQ 2/9 Scores  PHQ - 2 Score 0  PHQ- 9 Score 0    Fall Risk     No data to display           [place]:  Any stairs in or around the home? No  If so, are there any without handrails? No  Home free of loose throw rugs in walkways, pet beds, electrical cords, etc? Yes  Adequate lighting in your home to reduce risk of falls? Yes   ASSISTIVE DEVICES UTILIZED TO PREVENT FALLS:  Life alert? No  Use of a cane, walker or w/c? No  Grab bars in the bathroom? No  Shower chair or bench in shower? No  Elevated toilet seat or a handicapped toilet? No   TIMED UP AND GO:  Was the test performed?  pcp .  Length of time to ambulate 10 feet:  sec.   Gait steady and fast with assistive device  Cognitive Function:        Immunizations Immunization History  Administered Date(s) Administered   Influenza, High Dose Seasonal PF 03/31/2021   Pneumococcal Polysaccharide-23 03/31/2021    TDAP status: Up to date  Flu Vaccine  status: Up to date  Pneumococcal vaccine status: Up to date  Covid-19 vaccine status: Completed vaccines  Qualifies for Shingles Vaccine? Yes   Zostavax completed Yes   Shingrix Completed?: Yes  Screening Tests Health Maintenance  Topic Date Due   Medicare Annual Wellness (AWV)  Never done   COVID-19 Vaccine (1) Never done   Diabetic kidney evaluation - Urine ACR  Never done   Hepatitis C Screening  Never done   DTaP/Tdap/Td (1 - Tdap) Never done   COLONOSCOPY (Pts 45-60yr Insurance coverage will need to be confirmed)  Never done   Zoster Vaccines- Shingrix (1 of 2) Never done   MAMMOGRAM  04/20/2017   DEXA SCAN  Never done   Diabetic kidney evaluation - eGFR measurement  04/25/2021   INFLUENZA VACCINE  12/15/2021   Pneumonia Vaccine 67 Years old (2 - PCV) 03/31/2022   HPV VACCINES  Aged Out    Health Maintenance  Health Maintenance Due  Topic Date Due   Medicare Annual Wellness (AWV)  Never done   COVID-19 Vaccine (1) Never done   Diabetic kidney evaluation - Urine ACR  Never done   Hepatitis C Screening  Never done   DTaP/Tdap/Td (1 - Tdap) Never done   COLONOSCOPY (Pts 45-468yrInsurance coverage will need to be confirmed)  Never done   Zoster Vaccines- Shingrix (1 of 2) Never done   MAMMOGRAM  04/20/2017   DEXA SCAN  Never done   Diabetic kidney evaluation - eGFR measurement  04/25/2021   INFLUENZA VACCINE  12/15/2021   Pneumonia Vaccine 6578Years old (2 - PCV) 03/31/2022    Colorectal cancer screening: Type of screening: Colonoscopy. Completed yes.. Repeat every 10 years  Mammogram status: Ordered  . Pt provided with contact info and advised to call to schedule appt.   Bone Density status: Ordered  . Pt provided with contact info and advised to call to schedule appt.  Lung Cancer Screening: (Low Dose CT Chest recommended if Age 67-80ears, 30 pack-year currently smoking OR have quit w/in 15years.) does not qualify.   Lung Cancer Screening Referral:  n/a  Additional Screening:  Hepatitis C Screening: does qualify; Completed   Vision Screening: Recommended annual ophthalmology exams for early detection of glaucoma and other disorders of the eye. Is the patient up to date with their annual eye exam?  Yes  Who is the provider or what is the name of the office in which the patient attends annual eye exams?  If pt is not established with a provider, would they like to be referred to a provider to establish care? No .   Dental Screening: Recommended annual dental exams for proper oral hygiene  Community Resource Referral / Chronic Care Management: CRR required this visit?  No   CCM required this visit?  No      Plan:     I have personally reviewed and noted the following in the patient's chart:   Medical and social history Use of alcohol, tobacco or illicit drugs  Current medications and supplements including opioid prescriptions. Patient is not currently taking opioid prescriptions. Functional ability and status Nutritional status Physical activity Advanced directives List of other physicians Hospitalizations, surgeries, and ER visits in previous 12 months Vitals Screenings to include cognitive, depression, and falls Referrals and appointments  In addition, I have reviewed and discussed with patient certain preventive protocols, quality metrics, and best practice recommendations. A written personalized care plan for preventive services as well as general preventive health recommendations were provided to patient.     AuMelene PlanRMA   05/06/2022   Nurse Notes:

## 2022-05-06 NOTE — Progress Notes (Signed)
Established Patient Office Visit  Subjective    Patient ID: Amy Garner, female    DOB: [birth year]  Age: 66 y.o. MRN: [id number]  CC: No chief complaint on file.   HPI Amy Garner presents for routine follow up of chronic med issues. Patient denies acute complaints or concerns.    Outpatient Encounter Medications as of 05/06/2022  Medication Sig   albuterol (VENTOLIN HFA) 108 (90 Base) MCG/ACT inhaler Inhale 2 puffs into the lungs every 6 (six) hours as needed for wheezing or shortness of breath.    Alcohol Swabs PADS USE TO TAKE BLOOD SUGAR 2 TIMES A DAY   cholecalciferol (VITAMIN D3) 25 MCG (1000 UNIT) tablet Take 1,000 Units by mouth daily.   doxycycline (VIBRAMYCIN) 100 MG capsule Take 1 capsule (100 mg total) by mouth 2 (two) times daily.   escitalopram (LEXAPRO) 20 MG tablet Take 1 tablet (20 mg total) by mouth daily.   esomeprazole (NEXIUM) 40 MG capsule Take 1 capsule (40 mg total) by mouth in the morning.   glimepiride (AMARYL) 2 MG tablet TAKE 1 TABLET EVERY DAY WITH BREAKFAST   glucose blood (TRUE METRIX BLOOD GLUCOSE TEST) test strip Use as instructed   glucose blood test strip Accu-Chek SmartView Test Strips   guaiFENesin 200 MG tablet Take 1 tablet (200 mg total) by mouth every 4 (four) hours as needed for cough or to loosen phlegm.   LEVEMIR 100 UNIT/ML injection INJECT 45 UNITS UNDER THE SKIN EVERY MORNING   losartan (COZAAR) 25 MG tablet TAKE 1 TABLET EVERY DAY   lovastatin (MEVACOR) 10 MG tablet TAKE 1 TABLET AT BEDTIME   metFORMIN (GLUCOPHAGE-XR) 500 MG 24 hr tablet TAKE 2 TABLETS EVERY DAY WITH BREAKFAST   Misc. Devices MISC TRUE MATRIX METER   Semaglutide, 1 MG/DOSE, (OZEMPIC, 1 MG/DOSE,) 4 MG/3ML SOPN INJECT '1MG'$  UNDER THE SKIN ONE TIME WEEKLY   Semaglutide,0.25 or 0.'5MG'$ /DOS, (OZEMPIC, 0.25 OR 0.5 MG/DOSE,) 2 MG/1.5ML SOPN Inject 1 mg into the skin once a week.   triamterene-hydrochlorothiazide (DYAZIDE) 37.5-25 MG capsule TAKE 1 CAPSULE EVERY DAY    TRUEplus Lancets 33G MISC USE TO TAKE BLOOD SUGAR 2 TIMES A DAY   ALPRAZolam (XANAX) 0.25 MG tablet    Dulaglutide (TRULICITY) 1.09 NA/3.5TD SOPN    No facility-administered encounter medications on file as of 05/06/2022.    Past Medical History:  Diagnosis Date   Anxiety    Asthma    When ill   Cervical radiculopathy    Chronic pain syndrome    Depression    GERD (gastroesophageal reflux disease)    Hyperlipidemia    Hypertension    Peptic ulcer    Type 2 diabetes mellitus ([place])     Past Surgical History:  Procedure Laterality Date   BUNIONECTOMY     CERVICAL DISC SURGERY     CHOLECYSTECTOMY     DIAGNOSTIC LAPAROSCOPY  1980   endometriosis   INGUINAL HERNIA REPAIR Left 05/02/2020   Procedure: OPEN LEFT INGUINAL HERNIA REPAIR WITH MESH, TAP BLOCK;  Surgeon: Clovis Riley, MD;  Location: [facility];  Service: General;  Laterality: Left;   TUBAL LIGATION  1990s    Family History  Problem Relation Age of Onset   Heart attack Mother    Asthma Mother    Heart disease Father    Stomach cancer Paternal Aunt    Colon cancer Paternal Aunt     Social History   Socioeconomic History   Marital status: Married  Spouse name: Not on file   Number of children: Not on file   Years of education: Not on file   Highest education level: Not on file  Occupational History   Not on file  Tobacco Use   Smoking status: Former    Packs/day: 1.00    Years: 15.00    Total pack years: 15.00    Types: Cigarettes    Quit date: 2017    Years since quitting: 6.9   Smokeless tobacco: Never  Vaping Use   Vaping Use: Never used  Substance and Sexual Activity   Alcohol use: Never   Drug use: Never   Sexual activity: Not on file  Other Topics Concern   Not on file  Social History Narrative   Not on file   Social Determinants of Health   Financial Resource Strain: Not on file  Food Insecurity: Not on file  Transportation Needs: Not on file  Physical Activity: Not on file   Stress: Not on file  Social Connections: Not on file  Intimate Partner Violence: Not on file    Review of Systems  All other systems reviewed and are negative.       Objective    BP 134/78   Pulse 82   Temp 98.1 F (36.7 C)   Resp 16   Wt 239 lb 6.4 oz (108.6 kg)   SpO2 95%   BMI 38.64 kg/m   Physical Exam Vitals and nursing note reviewed.  Constitutional:      General: She is not in acute distress.    Appearance: She is obese.  Cardiovascular:     Rate and Rhythm: Normal rate and regular rhythm.  Pulmonary:     Effort: Pulmonary effort is normal.     Breath sounds: Normal breath sounds.  Abdominal:     Palpations: Abdomen is soft.     Tenderness: There is no abdominal tenderness.  Musculoskeletal:     Right lower leg: No edema.     Left lower leg: No edema.  Neurological:     General: No focal deficit present.     Mental Status: She is alert and oriented to person, place, and time.         Assessment & Plan:   1. Encounter for Medicare annual wellness exam   2. Type 2 diabetes mellitus with other specified complication, without long-term current use of insulin (HCC) Increased A1c and no longer at goal. Discussed compliance. Referral to [facility] for med management.  - POCT glycosylated hemoglobin (Hb A1C)  3. Essential hypertension Appears stable. Continue and monitor  4. Hyperlipidemia, unspecified hyperlipidemia type continue  5. Gastroesophageal reflux disease without esophagitis continue    Return in about 3 months (around 08/05/2022) for follow up.   Becky Sax, MD

## 2022-06-11 ENCOUNTER — Other Ambulatory Visit: Payer: Self-pay | Admitting: Family Medicine

## 2022-06-18 ENCOUNTER — Ambulatory Visit: Payer: Medicare HMO | Attending: Internal Medicine | Admitting: Pharmacist

## 2022-06-18 DIAGNOSIS — E1169 Type 2 diabetes mellitus with other specified complication: Secondary | ICD-10-CM | POA: Diagnosis not present

## 2022-06-18 DIAGNOSIS — Z794 Long term (current) use of insulin: Secondary | ICD-10-CM | POA: Diagnosis not present

## 2022-06-18 MED ORDER — SEMAGLUTIDE (2 MG/DOSE) 8 MG/3ML ~~LOC~~ SOPN: 2.0000 mg | PEN_INJECTOR | SUBCUTANEOUS | 1 refills | Status: DC

## 2022-06-18 MED ORDER — LANTUS SOLOSTAR 100 UNIT/ML ~~LOC~~ SOPN: 45.0000 [IU] | PEN_INJECTOR | Freq: Every day | SUBCUTANEOUS | 1 refills | Status: DC

## 2022-06-18 MED ORDER — PEN NEEDLES 32G X 4 MM MISC: 3 refills | Status: AC

## 2022-06-18 NOTE — Progress Notes (Signed)
    S:     No chief complaint on file.  67 y.o. female who presents for diabetes evaluation, education, and management.  PMH is significant for T2DM, HLD, GERD.  Patient was referred and last seen by Primary Care Provider, Dr. Redmond Pulling, on 05/06/2022. At that visit, A1c was 8.9%. She was asked to follow-up with me.   Today, patient arrives in good spirits and presents without any assistance.  Patient reports Diabetes was diagnosed ~10 years ago. Tells me she was placed on insulin + PO medications upon dx. Denies any history of clinical ASCVD, CHF, or CKD. Denies any personal or family hx of thyroid cancer. No pancreatitis hx.    Family/Social History:  Fhx: MI, asthma Tobacco: former smoker (quit in 2017) Alcohol: none reported   Current diabetes medications include: glimepiride 2 mg daily, Levemir 50 units daily, metformin 500 mg XR ('1000mg'$  daily), Ozempic 1 mg weekly  Current hypertension medications include: losartan 25 mg daily, triamterene-HCTZ 37.5-25 mg daily Current hyperlipidemia medications include: lovastatin 10 mg daily  Patient reports adherence to taking all medications as prescribed.   Insurance coverage: [employer]  Patient denies hypoglycemic events.  Reported home fasting blood sugars: 150s   Patient denies nocturia (nighttime urination).  Patient denies neuropathy (nerve pain). Patient denies visual changes. Patient reports self foot exams.   Patient reported dietary habits:  -Admits that her recent increased consumption of sweets is most likely to blame for her A1c being above goal  Patient-reported exercise habits:  -None reported   O:   7 day average blood glucose: no meter with her today   No CGM in place.   Lab Results  Component Value Date   HGBA1C 8.9 (A) 05/06/2022   There were no vitals filed for this visit.  Lipid Panel  No results found for: "CHOL", "TRIG", "HDL", "CHOLHDL", "VLDL", "LDLCALC", "LDLDIRECT"  Clinical  Atherosclerotic Cardiovascular Disease (ASCVD): No  The ASCVD Risk score (Arnett DK, et al., 2019) failed to calculate for the following reasons:   Cannot find a previous HDL lab   Cannot find a previous total cholesterol lab   A/P: Diabetes longstanding currently above goal based on her A1c. Patient is able to verbalize appropriate hypoglycemia management plan. Medication adherence appears appropriate. -Discontinued Levemir. -Start Lantus 45 units once daily.  -Increase dose of Ozempic to '2mg'$  once weekly.  -Discontinued glimepiride.  -Patient educated on purpose, proper use, and potential adverse effects of Ozempic, Lantus.  -Extensively discussed pathophysiology of diabetes, recommended lifestyle interventions, dietary effects on blood sugar control.  -Counseled on s/sx of and management of hypoglycemia.  -Next A1c anticipated 07/2022.   Written patient instructions provided. Patient verbalized understanding of treatment plan.  Total time in face to face counseling 20 minutes.    Follow-up:  Pharmacist in 1 month.   Benard Halsted, PharmD, [facility], [place] [phone]

## 2022-07-23 ENCOUNTER — Ambulatory Visit: Payer: Medicare HMO | Attending: Family Medicine | Admitting: Pharmacist

## 2022-07-23 ENCOUNTER — Encounter: Payer: Self-pay | Admitting: Pharmacist

## 2022-07-23 DIAGNOSIS — Z794 Long term (current) use of insulin: Secondary | ICD-10-CM | POA: Diagnosis not present

## 2022-07-23 DIAGNOSIS — E1169 Type 2 diabetes mellitus with other specified complication: Secondary | ICD-10-CM | POA: Diagnosis not present

## 2022-07-23 MED ORDER — LANTUS SOLOSTAR 100 UNIT/ML ~~LOC~~ SOPN: 50.0000 [IU] | PEN_INJECTOR | Freq: Every day | SUBCUTANEOUS | 1 refills | Status: DC

## 2022-07-23 NOTE — Progress Notes (Signed)
    S:     No chief complaint on file.  67 y.o. female who presents for diabetes evaluation, education, and management.  PMH is significant for T2DM, HLD, GERD.  Patient was referred and last seen by Primary Care Provider, Dr. Redmond Pulling, on 05/06/2022. At that visit, A1c was 8.9%. She was asked to follow-up with me. I saw her on 06/18/2022 and increased her Ozempic to 2mg  weekly.   Today, patient arrives in good spirits and presents without any assistance. Some nausea and 1 episode of vomiting initially after dose increase. This has since resolved. She has some abdominal discomfort but no acute pain.  Family/Social History:  Fhx: MI, asthma Tobacco: former smoker (quit in 2017) Alcohol: none reported   Current diabetes medications include: Lantus 45u daily, metformin 500 mg XR (1000mg  daily), Ozempic 2 mg weekly   Patient reports adherence to taking all medications as prescribed.   Insurance coverage: [employer] Medicare  Patient denies hypoglycemic events.  Reported home fasting blood sugars: 130 - 170  Patient denies nocturia (nighttime urination).  Patient denies neuropathy (nerve pain). Patient denies visual changes. Patient reports self foot exams.   Patient reported dietary habits:  -Mostly adherent to a diabetic diet.   Patient-reported exercise habits:  -None reported   O:  7 day average blood glucose: no meter with her today   No CGM in place.   Lab Results  Component Value Date   HGBA1C 8.9 (A) 05/06/2022   There were no vitals filed for this visit.  Lipid Panel  No results found for: "CHOL", "TRIG", "HDL", "CHOLHDL", "VLDL", "LDLCALC", "LDLDIRECT"  Clinical Atherosclerotic Cardiovascular Disease (ASCVD): No  The ASCVD Risk score (Arnett DK, et al., 2019) failed to calculate for the following reasons:   Cannot find a previous HDL lab   Cannot find a previous total cholesterol lab   A/P: Diabetes longstanding currently above goal based on her A1c. Patient is  able to verbalize appropriate hypoglycemia management plan. Medication adherence appears appropriate. -Increase Lantus to 50 units once daily.  -Continue Ozempic 2mg  once weekly.  -Continue metformin 500 mg XR at current dosing. -Extensively discussed pathophysiology of diabetes, recommended lifestyle interventions, dietary effects on blood sugar control.  -Counseled on s/sx of and management of hypoglycemia.  -Next A1c anticipated later this month with PCP.  Written patient instructions provided. Patient verbalized understanding of treatment plan.  Total time in face to face counseling 20 minutes.    Follow-up:  Pharmacist in 6 weeks.  Benard Halsted, PharmD, [facility], [place] [phone]

## 2022-08-05 ENCOUNTER — Ambulatory Visit: Payer: Medicare HMO | Admitting: Family Medicine

## 2022-08-13 ENCOUNTER — Ambulatory Visit (INDEPENDENT_AMBULATORY_CARE_PROVIDER_SITE_OTHER): Payer: Medicare HMO | Admitting: Family Medicine

## 2022-08-13 ENCOUNTER — Encounter: Payer: Self-pay | Admitting: Family Medicine

## 2022-08-13 VITALS — BP 121/78 | HR 81

## 2022-08-13 DIAGNOSIS — M2061 Acquired deformities of toe(s), unspecified, right foot: Secondary | ICD-10-CM

## 2022-08-13 DIAGNOSIS — I10 Essential (primary) hypertension: Secondary | ICD-10-CM | POA: Diagnosis not present

## 2022-08-13 DIAGNOSIS — E785 Hyperlipidemia, unspecified: Secondary | ICD-10-CM

## 2022-08-13 DIAGNOSIS — K219 Gastro-esophageal reflux disease without esophagitis: Secondary | ICD-10-CM | POA: Diagnosis not present

## 2022-08-13 DIAGNOSIS — R42 Dizziness and giddiness: Secondary | ICD-10-CM | POA: Diagnosis not present

## 2022-08-13 DIAGNOSIS — E1169 Type 2 diabetes mellitus with other specified complication: Secondary | ICD-10-CM

## 2022-08-13 MED ORDER — ESOMEPRAZOLE MAGNESIUM 40 MG PO CPDR: 40.0000 mg | DELAYED_RELEASE_CAPSULE | Freq: Every morning | ORAL | 1 refills | Status: DC

## 2022-08-13 MED ORDER — MECLIZINE HCL 25 MG PO TABS: 25.0000 mg | ORAL_TABLET | Freq: Three times a day (TID) | ORAL | 0 refills | Status: DC | PRN

## 2022-08-13 NOTE — Progress Notes (Signed)
Established Patient Office Visit  Subjective    Patient ID: Amy Garner, female    DOB: Apr 17, 1956  Age: 67 y.o. MRN: [id number]  CC: No chief complaint on file.   HPI KARESSE METRO presents for routine follow up of chronic med issues. Patient also reports her toe is deformed and is painful. Also patient reports intermittent dizziness.     Outpatient Encounter Medications as of 08/13/2022  Medication Sig   albuterol (VENTOLIN HFA) 108 (90 Base) MCG/ACT inhaler Inhale 2 puffs into the lungs every 6 (six) hours as needed for wheezing or shortness of breath.    Alcohol Swabs PADS USE TO TAKE BLOOD SUGAR 2 TIMES A DAY   ALPRAZolam (XANAX) 0.25 MG tablet    cholecalciferol (VITAMIN D3) 25 MCG (1000 UNIT) tablet Take 1,000 Units by mouth daily.   escitalopram (LEXAPRO) 20 MG tablet Take 1 tablet (20 mg total) by mouth daily.   esomeprazole (NEXIUM) 40 MG capsule Take 1 capsule (40 mg total) by mouth in the morning.   FLUoxetine (PROZAC) 20 MG capsule    glucose blood (TRUE METRIX BLOOD GLUCOSE TEST) test strip Use as instructed   glucose blood test strip Accu-Chek SmartView Test Strips   insulin glargine (LANTUS SOLOSTAR) 100 UNIT/ML Solostar Pen Inject 50 Units into the skin daily.   Insulin Pen Needle (PEN NEEDLES) 32G X 4 MM MISC Use to inject insulin once daily.   losartan (COZAAR) 25 MG tablet TAKE 1 TABLET EVERY DAY   lovastatin (MEVACOR) 10 MG tablet TAKE 1 TABLET AT BEDTIME   metFORMIN (GLUCOPHAGE-XR) 500 MG 24 hr tablet TAKE 2 TABLETS EVERY DAY WITH BREAKFAST   Misc. Devices MISC TRUE MATRIX METER   Semaglutide, 2 MG/DOSE, 8 MG/3ML SOPN Inject 2 mg as directed once a week.   triamterene-hydrochlorothiazide (DYAZIDE) 37.5-25 MG capsule TAKE 1 CAPSULE EVERY DAY   TRUEplus Lancets 33G MISC USE TO TAKE BLOOD SUGAR 2 TIMES A DAY   [DISCONTINUED] esomeprazole (NEXIUM) 40 MG capsule Take 1 capsule (40 mg total) by mouth in the morning.   No facility-administered encounter  medications on file as of 08/13/2022.    Past Medical History:  Diagnosis Date   Anxiety    Asthma    When ill   Cervical radiculopathy    Chronic pain syndrome    Depression    GERD (gastroesophageal reflux disease)    Hyperlipidemia    Hypertension    Peptic ulcer    Type 2 diabetes mellitus ([place])     Past Surgical History:  Procedure Laterality Date   BUNIONECTOMY     CERVICAL DISC SURGERY     CHOLECYSTECTOMY     DIAGNOSTIC LAPAROSCOPY  1980   endometriosis   INGUINAL HERNIA REPAIR Left 05/02/2020   Procedure: OPEN LEFT INGUINAL HERNIA REPAIR WITH MESH, TAP BLOCK;  Surgeon: Clovis Riley, MD;  Location: [facility];  Service: General;  Laterality: Left;   TUBAL LIGATION  1990s    Family History  Problem Relation Age of Onset   Heart attack Mother    Asthma Mother    Heart disease Father    Stomach cancer Paternal Aunt    Colon cancer Paternal Aunt     Social History   Socioeconomic History   Marital status: Widowed    Spouse name: Not on file   Number of children: Not on file   Years of education: Not on file   Highest education level: Associate degree: occupational, Hotel manager, or vocational program  Occupational History   Not on file  Tobacco Use   Smoking status: Former    Packs/day: 1.00    Years: 15.00    Additional pack years: 0.00    Total pack years: 15.00    Types: Cigarettes    Quit date: 2017    Years since quitting: 7.2   Smokeless tobacco: Never  Vaping Use   Vaping Use: Never used  Substance and Sexual Activity   Alcohol use: Never   Drug use: Never   Sexual activity: Not on file  Other Topics Concern   Not on file  Social History Narrative   Not on file   Social Determinants of Health   Financial Resource Strain: Medium Risk (08/09/2022)   Overall Financial Resource Strain (CARDIA)    Difficulty of Paying Living Expenses: Somewhat hard  Food Insecurity: Food Insecurity Present (08/09/2022)   Hunger Vital Sign    Worried About  Running Out of Food in the Last Year: Sometimes true    Ran Out of Food in the Last Year: Sometimes true  Transportation Needs: No Transportation Needs (08/09/2022)   PRAPARE - Hydrologist (Medical): No    Lack of Transportation (Non-Medical): No  Physical Activity: Insufficiently Active (08/09/2022)   Exercise Vital Sign    Days of Exercise per Week: 1 day    Minutes of Exercise per Session: 20 min  Stress: No Stress Concern Present (08/09/2022)   Success    Feeling of Stress : Not at all  Social Connections: Moderately Isolated (08/09/2022)   Social Connection and Isolation Panel [NHANES]    Frequency of Communication with Friends and Family: More than three times a week    Frequency of Social Gatherings with Friends and Family: Twice a week    Attends Religious Services: Never    Marine scientist or Organizations: Yes    Attends Music therapist: More than 4 times per year    Marital Status: Widowed  Intimate Partner Violence: Not At Risk (07/23/2022)   Humiliation, Afraid, Rape, and Kick questionnaire    Fear of Current or Ex-Partner: No    Emotionally Abused: No    Physically Abused: No    Sexually Abused: No    Review of Systems  All other systems reviewed and are negative.       Objective    BP 121/78   Pulse 81   Physical Exam Vitals and nursing note reviewed.  Constitutional:      General: She is not in acute distress. Cardiovascular:     Rate and Rhythm: Normal rate and regular rhythm.  Pulmonary:     Effort: Pulmonary effort is normal.     Breath sounds: Normal breath sounds.  Abdominal:     Palpations: Abdomen is soft.     Tenderness: There is no abdominal tenderness.  Musculoskeletal:     Right lower leg: No edema.     Left lower leg: No edema.  Neurological:     General: No focal deficit present.     Mental Status: She is alert and  oriented to person, place, and time.         Assessment & Plan:   1. Type 2 diabetes mellitus with other specified complication, without long-term current use of insulin (HCC)  - Hemoglobin A1c  2. Essential hypertension Appears stable. continue - Basic Metabolic Panel - Lipid Panel - AST - ALT  3. Gastroesophageal reflux disease without esophagitis Continue. Meds refilled.   4. Hyperlipidemia, unspecified hyperlipidemia type Continue. Monitoring labs ordered - Lipid Panel - AST - ALT  5. Vertigo Meclizine prescribed.   6. Deformity of toe of right foot Referral to podiatry for further eval/mgt - Ambulatory referral to Podiatry     Return in about 6 months (around 02/13/2023) for follow up.   Becky Sax, MD

## 2022-08-14 LAB — LIPID PANEL
Chol/HDL Ratio: 3.6 ratio (ref 0.0–4.4)
Cholesterol, Total: 150 mg/dL (ref 100–199)
HDL: 42 mg/dL (ref >39)
LDL Chol Calc (NIH): 83 mg/dL (ref 0–99)
Triglycerides: 144 mg/dL (ref 0–149)
VLDL Cholesterol Cal: 25 mg/dL (ref 5–40)

## 2022-08-14 LAB — BASIC METABOLIC PANEL
BUN/Creatinine Ratio: 21 (ref 12–28)
BUN: 19 mg/dL (ref 8–27)
CO2: 25 mmol/L (ref 20–29)
Calcium: 10.4 mg/dL — ABNORMAL HIGH (ref 8.7–10.3)
Chloride: 99 mmol/L (ref 96–106)
Creatinine, Ser: 0.92 mg/dL (ref 0.57–1.00)
Glucose: 167 mg/dL — ABNORMAL HIGH (ref 70–99)
Potassium: 4.5 mmol/L (ref 3.5–5.2)
Sodium: 141 mmol/L (ref 134–144)
eGFR: 68 mL/min/{1.73_m2} (ref >59)

## 2022-08-14 LAB — HEMOGLOBIN A1C

## 2022-08-14 LAB — ALT: ALT: 49 IU/L — ABNORMAL HIGH (ref 0–32)

## 2022-08-14 LAB — AST: AST: 37 IU/L (ref 0–40)

## 2022-08-26 ENCOUNTER — Other Ambulatory Visit: Payer: Self-pay | Admitting: Family Medicine

## 2022-08-28 ENCOUNTER — Other Ambulatory Visit: Payer: Self-pay | Admitting: Family Medicine

## 2022-09-03 ENCOUNTER — Ambulatory Visit (INDEPENDENT_AMBULATORY_CARE_PROVIDER_SITE_OTHER): Payer: Medicare HMO

## 2022-09-03 ENCOUNTER — Ambulatory Visit: Payer: Medicare HMO | Admitting: Podiatry

## 2022-09-03 ENCOUNTER — Encounter: Payer: Self-pay | Admitting: Podiatry

## 2022-09-03 ENCOUNTER — Ambulatory Visit: Payer: Medicare HMO | Admitting: Pharmacist

## 2022-09-03 DIAGNOSIS — M21611 Bunion of right foot: Secondary | ICD-10-CM

## 2022-09-03 DIAGNOSIS — L600 Ingrowing nail: Secondary | ICD-10-CM

## 2022-09-03 DIAGNOSIS — M21619 Bunion of unspecified foot: Secondary | ICD-10-CM | POA: Diagnosis not present

## 2022-09-03 DIAGNOSIS — M21612 Bunion of left foot: Secondary | ICD-10-CM

## 2022-09-03 DIAGNOSIS — M2041 Other hammer toe(s) (acquired), right foot: Secondary | ICD-10-CM

## 2022-09-03 NOTE — Patient Instructions (Signed)

## 2022-09-05 NOTE — Progress Notes (Signed)
Subjective:   Patient ID: Amy Garner, female   DOB: 67 y.o.   MRN: [id number]   HPI Patient states with severe bunion deformity that she has had since she was a teenager and states that it is getting worse with further elevation of the second toe that no longer contacts the surface.  States it is becoming increasingly painful and difficult to find shoes that could accommodate it.  The left big toenail is thick and damaged and it is becoming increasingly painful.  Patient does not smoke likes to be active   Review of Systems  All other systems reviewed and are negative.       Objective:  Physical Exam Vitals and nursing note reviewed.  Constitutional:      Appearance: She is well-developed.  Pulmonary:     Effort: Pulmonary effort is normal.  Musculoskeletal:        General: Normal range of motion.  Skin:    General: Skin is warm.  Neurological:     Mental Status: She is alert.     Neurovascular status intact muscle strength found to be adequate range of motion within normal limits.  Patient is found to have severe bunion deformity right with prominence redness and pain and severe digital deformity second digit right foot that has completely disarticulated and does not contact the surface.  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  I do think that the right will be correction due to her level of discomfort but I do think it will take either a Lapidus fusion or first MPJ fusion and attempt at repositioning the second digit with probable shortening osteotomy.  I explained this to her and I am referring to Dr. Allena Katz for surgical correction.   For the left foot I recommended nail removal she wants this done I allowed her to sign consent form and I went ahead today and I infiltrated 60 mg Xylocaine Marcaine mixture sterile prep done and using sterile instrumentation removed the hallux nail exposed matrix applied phenol 5 applications 30 seconds followed by alcohol lavage sterile dressing gave instructions on soaks and to wear dressing for 24 hours take it off earlier if throbbing were to occur.  Patient will call questions concerns if they arise  X-rays indicate right severe elevation of the intermetatarsal angle of at least 20 degrees with a completely disarticulated second digit with no contact to the surface

## 2022-09-09 ENCOUNTER — Ambulatory Visit
Admission: EM | Admit: 2022-09-09 | Discharge: 2022-09-09 | Disposition: A | Discharge status: Home / Self Care | Payer: Medicare HMO | Attending: Internal Medicine | Admitting: Internal Medicine

## 2022-09-09 ENCOUNTER — Ambulatory Visit (INDEPENDENT_AMBULATORY_CARE_PROVIDER_SITE_OTHER): Payer: Medicare HMO

## 2022-09-09 ENCOUNTER — Encounter: Payer: Self-pay | Admitting: Emergency Medicine

## 2022-09-09 ENCOUNTER — Other Ambulatory Visit: Payer: Self-pay

## 2022-09-09 DIAGNOSIS — L03032 Cellulitis of left toe: Secondary | ICD-10-CM | POA: Diagnosis not present

## 2022-09-09 DIAGNOSIS — M7989 Other specified soft tissue disorders: Secondary | ICD-10-CM | POA: Diagnosis not present

## 2022-09-09 MED ORDER — CEPHALEXIN 500 MG PO CAPS: 500.0000 mg | ORAL_CAPSULE | Freq: Four times a day (QID) | ORAL | 0 refills | Status: DC

## 2022-09-09 MED ORDER — DOXYCYCLINE HYCLATE 100 MG PO CAPS: 100.0000 mg | ORAL_CAPSULE | Freq: Two times a day (BID) | ORAL | 0 refills | Status: DC

## 2022-09-09 NOTE — ED Provider Notes (Signed)
[facility]    CSN: [id number] Arrival date & time: 09/09/22  1316      History   Chief Complaint Chief Complaint  Patient presents with   Toe Pain    HPI Amy Garner is a 67 y.o. female.   Patient presents with left great toe redness and pain.  Patient had toenail removal on 4/19 by podiatry.  She reports that she noticed some increased redness and swelling and a blisterlike lesion about 2 to 3 days ago.  She denies any purulent drainage from the area or any associated fever.  Reports that she has been soaking in Epsom salt.  She does have diabetes so she is not sure if this is related.   Toe Pain    Past Medical History:  Diagnosis Date   Anxiety    Asthma    When ill   Cervical radiculopathy    Chronic pain syndrome    Depression    GERD (gastroesophageal reflux disease)    Hyperlipidemia    Hypertension    Peptic ulcer    Type 2 diabetes mellitus     There are no problems to display for this patient.   Past Surgical History:  Procedure Laterality Date   BUNIONECTOMY     CERVICAL DISC SURGERY     CHOLECYSTECTOMY     DIAGNOSTIC LAPAROSCOPY  1980   endometriosis   INGUINAL HERNIA REPAIR Left 05/02/2020   Procedure: OPEN LEFT INGUINAL HERNIA REPAIR WITH MESH, TAP BLOCK;  Surgeon: Berna Bue, MD;  Location: [facility];  Service: General;  Laterality: Left;   TUBAL LIGATION  1990s    OB History   No obstetric history on file.      Home Medications    Prior to Admission medications   Medication Sig Start Date End Date Taking? Authorizing Provider  albuterol (VENTOLIN HFA) 108 (90 Base) MCG/ACT inhaler Inhale 2 puffs into the lungs every 6 (six) hours as needed for wheezing or shortness of breath.    Yes [provider]  Alcohol Swabs PADS USE TO TAKE BLOOD SUGAR 2 TIMES A DAY 07/27/21  Yes Georganna Skeans, MD  cephALEXin (KEFLEX) 500 MG capsule Take 1 capsule (500 mg total) by mouth 4 (four) times daily. 09/09/22  Yes  Luva Metzger, Rolly Salter E, FNP  cholecalciferol (VITAMIN D3) 25 MCG (1000 UNIT) tablet Take 1,000 Units by mouth daily.   Yes [provider]  doxycycline (VIBRAMYCIN) 100 MG capsule Take 1 capsule (100 mg total) by mouth 2 (two) times daily. 09/09/22  Yes Jahzara Slattery, Rolly Salter E, FNP  escitalopram (LEXAPRO) 20 MG tablet Take 1 tablet (20 mg total) by mouth daily. 11/30/21  Yes Georganna Skeans, MD  esomeprazole (NEXIUM) 40 MG capsule Take 1 capsule (40 mg total) by mouth in the morning. 08/13/22  Yes Georganna Skeans, MD  FLUoxetine (PROZAC) 20 MG capsule    Yes [provider]  glucose blood (TRUE METRIX BLOOD GLUCOSE TEST) test strip Use as instructed 07/27/21  Yes Georganna Skeans, MD  glucose blood test strip Accu-Chek SmartView Test Strips   Yes [provider]  insulin glargine (LANTUS SOLOSTAR) 100 UNIT/ML Solostar Pen Inject 50 Units into the skin daily. 07/23/22  Yes Newlin, Enobong, MD  Insulin Pen Needle (PEN NEEDLES) 32G X 4 MM MISC Use to inject insulin once daily. 06/18/22  Yes Hoy Register, MD  losartan (COZAAR) 25 MG tablet TAKE 1 TABLET EVERY DAY 06/11/22  Yes Georganna Skeans, MD  lovastatin (MEVACOR) 10 MG  tablet TAKE 1 TABLET AT BEDTIME 06/11/22  Yes Georganna Skeans, MD  meclizine (ANTIVERT) 25 MG tablet Take 1 tablet (25 mg total) by mouth 3 (three) times daily as needed for dizziness. 08/13/22  Yes Georganna Skeans, MD  metFORMIN (GLUCOPHAGE-XR) 500 MG 24 hr tablet TAKE 2 TABLETS EVERY DAY WITH BREAKFAST 08/30/22  Yes Georganna Skeans, MD  Misc. Devices MISC TRUE MATRIX METER 07/27/21  Yes Georganna Skeans, MD  Semaglutide, 2 MG/DOSE, 8 MG/3ML SOPN Inject 2 mg as directed once a week. 06/18/22  Yes Hoy Register, MD  triamterene-hydrochlorothiazide (DYAZIDE) 37.5-25 MG capsule TAKE 1 CAPSULE EVERY DAY 06/11/22  Yes Georganna Skeans, MD  TRUEplus Lancets 33G MISC USE TO TAKE BLOOD SUGAR 2 TIMES A DAY 07/27/21  Yes Georganna Skeans, MD    Family History Family History  Problem Relation Age of  Onset   Heart attack Mother    Asthma Mother    Heart disease Father    Stomach cancer Paternal Aunt    Colon cancer Paternal Aunt     Social History Social History   Tobacco Use   Smoking status: Former    Packs/day: 1.00    Years: 15.00    Additional pack years: 0.00    Total pack years: 15.00    Types: Cigarettes    Quit date: 2017    Years since quitting: 7.3   Smokeless tobacco: Never  Vaping Use   Vaping Use: Never used  Substance Use Topics   Alcohol use: Never   Drug use: Never     Allergies   Atenolol and Codeine   Review of Systems Review of Systems Per HPI  Physical Exam Triage Vital Signs ED Triage Vitals  Enc Vitals Group     BP 09/09/22 1333 110/67     Pulse Rate 09/09/22 1333 78     Resp 09/09/22 1333 18     Temp 09/09/22 1333 98.9 F (37.2 C)     Temp Source 09/09/22 1333 Oral     SpO2 09/09/22 1333 94 %     Weight 09/09/22 1336 230 lb (104.3 kg)     Height 09/09/22 1336  (1.676 m)     Head Circumference --      Peak Flow --      Pain Score 09/09/22 1335 5     Pain Loc --      Pain Edu? --      Excl. in GC? --    No data found.  Updated Vital Signs BP 110/67 (BP Location: Left Arm)   Pulse 78   Temp 98.9 F (37.2 C) (Oral)   Resp 18   Ht  (1.676 m)   Wt 230 lb (104.3 kg)   SpO2 94%   BMI 37.12 kg/m   Visual Acuity Right Eye Distance:   Left Eye Distance:   Bilateral Distance:    Right Eye Near:   Left Eye Near:    Bilateral Near:     Physical Exam Constitutional:      General: She is not in acute distress.    Appearance: Normal appearance. She is not toxic-appearing or diaphoretic.  HENT:     Head: Normocephalic and atraumatic.  Eyes:     Extraocular Movements: Extraocular movements intact.     Conjunctiva/sclera: Conjunctivae normal.  Pulmonary:     Effort: Pulmonary effort is normal.  Feet:     Comments: Patient's entirety of left great toe is swollen and erythematous.  Capillary refill and pulses  appear intact.  She has full range of motion of the left great toe.  Bed of toenail is also erythematous and mildly swollen.  On the plantar surface of the toe there is some broken skin that appear to be popped blisterlike lesions.  No obvious purulent drainage noted on exam. Neurological:     General: No focal deficit present.     Mental Status: She is alert and oriented to person, place, and time. Mental status is at baseline.  Psychiatric:        Mood and Affect: Mood normal.        Behavior: Behavior normal.        Thought Content: Thought content normal.        Judgment: Judgment normal.      UC Treatments / Results  Labs (all labs ordered are listed, but only abnormal results are displayed) Labs Reviewed - No data to display  EKG   Radiology DG Toe Great Left  Result Date: 09/09/2022 CLINICAL DATA:  Toe infection. EXAM: LEFT GREAT TOE COMPARISON:  09/03/2022 FINDINGS: Joint space narrowing and degenerative changes at the first MTP joint. Negative for fracture or dislocation. Evidence for soft tissue swelling around the great toe. No evidence for a radiopaque foreign body. No evidence for cortical destruction or periosteal reaction. IMPRESSION: 1. No acute bone abnormality to the left great toe. 2. Degenerative changes at the first MTP joint. 3. Soft tissue swelling. Electronically Signed   By: Richarda Overlie M.D.   On: 09/09/2022 14:13    Procedures Procedures (including critical care time)  Medications Ordered in UC Medications - No data to display  Initial Impression / Assessment and Plan / UC Course  I have reviewed the triage vital signs and the nursing notes.  Pertinent labs & imaging results that were available during my care of the patient were reviewed by me and considered in my medical decision making (see chart for details).     Patient has infected left great toe.  X-ray was performed to ensure osteomyelitis is not present.  It was negative for any obvious findings  of this.  Therefore, will treat with antibiotic and doxycycline.  Advised to follow-up with podiatry as soon as possible.  She was also given strict ER precautions.  Patient verbalized understanding and was agreeable with plan. Final Clinical Impressions(s) / UC Diagnoses   Final diagnoses:  Cellulitis of great toe of left foot     Discharge Instructions      I have prescribed you 2 different antibiotics for infection to your left great toe.  Keep covered until otherwise advised by podiatry.  Call podiatry immediately for follow-up.     ED Prescriptions     Medication Sig Dispense Auth. Provider   cephALEXin (KEFLEX) 500 MG capsule Take 1 capsule (500 mg total) by mouth 4 (four) times daily. 28 capsule [place], [place] E, [place]   doxycycline (VIBRAMYCIN) 100 MG capsule Take 1 capsule (100 mg total) by mouth 2 (two) times daily. 20 capsule [place], [place] E, [place]      I have reviewed the PDMP during this encounter.   Gustavus Bryant, [place] 09/09/22 1440

## 2022-09-09 NOTE — Discharge Instructions (Signed)
I have prescribed you 2 different antibiotics for infection to your left great toe.  Keep covered until otherwise advised by podiatry.  Call podiatry immediately for follow-up.

## 2022-09-09 NOTE — ED Triage Notes (Signed)
Patient states she had her big toenail removed last week and noticed on Tuesday some redness in the area.  Patient has been soaking her toe x 2 daily.  Having some pain, taken Tylenol.

## 2022-09-10 ENCOUNTER — Telehealth: Payer: Self-pay | Admitting: *Deleted

## 2022-09-10 NOTE — Telephone Encounter (Signed)
Patient is calling to let the physician know that she went to Urgent care for infection to the post procedural left foot toe that was done, was given an antibiotic.

## 2022-09-13 NOTE — Telephone Encounter (Signed)
Make sure she is feeling better. Seeing patel for bunion later this week

## 2022-09-17 ENCOUNTER — Ambulatory Visit: Payer: Medicare HMO | Admitting: Podiatry

## 2022-09-17 DIAGNOSIS — M2041 Other hammer toe(s) (acquired), right foot: Secondary | ICD-10-CM

## 2022-09-17 DIAGNOSIS — Z01818 Encounter for other preprocedural examination: Secondary | ICD-10-CM

## 2022-09-17 DIAGNOSIS — M19071 Primary osteoarthritis, right ankle and foot: Secondary | ICD-10-CM | POA: Diagnosis not present

## 2022-09-17 NOTE — Progress Notes (Signed)
Subjective:  Patient ID: Amy Garner, female    DOB: 03-16-1956,  MRN: [id number]  Chief Complaint  Patient presents with   foot evaluation     Evaluation for right bunion repair, pain located in the great hallux and 2nd digit    67 y.o. female presents with the above complaint.  Patient presents with right severe bunion deformity with underlying arthritic changes.  She also has hammertoe contracture of second and third digit.  She states pain for touch is progressive gotten worse she is tried over-the-counter options none of which has helped.  She has tried shoe gear modification she would like to discuss surgical options.  Denies any other acute issues.  Pain scale 7 out of 10 dull achy in nature   Review of Systems: Negative except as noted in the HPI. Denies N/V/F/Ch.  Past Medical History:  Diagnosis Date   Anxiety    Asthma    When ill   Cervical radiculopathy    Chronic pain syndrome    Depression    GERD (gastroesophageal reflux disease)    Hyperlipidemia    Hypertension    Peptic ulcer    Type 2 diabetes mellitus (HCC)     Current Outpatient Medications:    albuterol (VENTOLIN HFA) 108 (90 Base) MCG/ACT inhaler, Inhale 2 puffs into the lungs every 6 (six) hours as needed for wheezing or shortness of breath., Disp: 18 g, Rfl: 5   Alcohol Swabs PADS, USE TO TAKE BLOOD SUGAR 2 TIMES A DAY, Disp: 120 each, Rfl: 2   cholecalciferol (VITAMIN D3) 25 MCG (1000 UNIT) tablet, Take 1,000 Units by mouth daily., Disp: , Rfl:    escitalopram (LEXAPRO) 20 MG tablet, Take 1 tablet (20 mg total) by mouth daily., Disp: 30 tablet, Rfl: 2   esomeprazole (NEXIUM) 40 MG capsule, Take 1 capsule (40 mg total) by mouth in the morning., Disp: 90 capsule, Rfl: 1   FLUoxetine (PROZAC) 20 MG capsule, Take 1 capsule (20 mg total) by mouth daily., Disp: 90 capsule, Rfl: 0   glucose blood (TRUE METRIX BLOOD GLUCOSE TEST) test strip, Use as instructed, Disp: 100 each, Rfl: 12   glucose blood  test strip, Use as instructed, Disp: 100 each, Rfl: 5   insulin glargine (LANTUS SOLOSTAR) 100 UNIT/ML Solostar Pen, Inject 50 Units into the skin daily., Disp: 45 mL, Rfl: 1   Insulin Pen Needle (PEN NEEDLES) 32G X 4 MM MISC, Use to inject insulin once daily., Disp: 100 each, Rfl: 3   losartan (COZAAR) 25 MG tablet, TAKE 1 TABLET EVERY DAY, Disp: 90 tablet, Rfl: 3   lovastatin (MEVACOR) 10 MG tablet, TAKE 1 TABLET AT BEDTIME, Disp: 90 tablet, Rfl: 3   meclizine (ANTIVERT) 25 MG tablet, Take 1 tablet (25 mg total) by mouth 3 (three) times daily as needed for dizziness., Disp: 30 tablet, Rfl: 0   metFORMIN (GLUCOPHAGE-XR) 500 MG 24 hr tablet, TAKE 2 TABLETS twice daily with meals., Disp: 360 tablet, Rfl: 2   Misc. Devices MISC, TRUE MATRIX METER, Disp: 1 each, Rfl: 0   Semaglutide, 2 MG/DOSE, 8 MG/3ML SOPN, Inject 2 mg as directed once a week., Disp: 9 mL, Rfl: 1   triamterene-hydrochlorothiazide (DYAZIDE) 37.5-25 MG capsule, TAKE 1 CAPSULE EVERY DAY, Disp: 90 capsule, Rfl: 3   TRUEplus Lancets 33G MISC, USE TO TAKE BLOOD SUGAR 2 TIMES A DAY, Disp: 100 each, Rfl: 2  Social History   Tobacco Use  Smoking Status Former   Packs/day: 1.00  Years: 15.00   Additional pack years: 0.00   Total pack years: 15.00   Types: Cigarettes   Quit date: 2017   Years since quitting: 7.3  Smokeless Tobacco Never    Allergies  Allergen Reactions   Atenolol Anaphylaxis   Codeine Other (See Comments)    Pt says she gets "crazy"   Objective:  There were no vitals filed for this visit. There is no height or weight on file to calculate BMI. Constitutional Well developed. Well nourished.  Vascular Dorsalis pedis pulses palpable bilaterally. Posterior tibial pulses palpable bilaterally. Capillary refill normal to all digits.  No cyanosis or clubbing noted. Pedal hair growth normal.  Neurologic Normal speech. Oriented to person, place, and time. Epicritic sensation to light touch grossly present  bilaterally.  Dermatologic Nails well groomed and normal in appearance. No open wounds. No skin lesions.  Orthopedic: Pain on palpation of right first metatarsophalangeal joint clinical crepitus noted at the first metatarsophalangeal joint limited range of motion of the fourth MTPJ.  Hammertoe contracture noted second and third digits with Joint contracture at the metatarsophalangeal joint.  Semiflexible in nature.   Radiographs: 3 views of skeletally mature adult right foot: severe bunion deformity noted with osteoarthritis of the first metatarsophalangeal joint hammertoe contracture of second and third digit noted. Assessment:   1. Arthritis of first metatarsophalangeal (MTP) joint of right foot   2. Hammer toe of right foot   3. Encounter for preoperative examination for general surgical procedure    Plan:  Patient was evaluated and treated and all questions answered.  Right severe first metatarsophalangeal joint arthritis with underlying bunion deformity with hammertoe of second and third cc -All questions and concerns were discussed with the patient in extensive detail.  Given the amount of pain that she is having she will benefit from first metatarsophalangeal joint fusion with hammertoe correction of second and third digit with capsulotomy of the second and third metatarsal phalangeal joint with fixation -I discussed my preoperative intra postop plan in extensive detail she states understand like to proceed with surgery. -Informed surgical risk consent was reviewed and read aloud to the patient.  I reviewed the films.  I have discussed my findings with the patient in great detail.  I have discussed all risks including but not limited to infection, stiffness, scarring, limp, disability, deformity, damage to blood vessels and nerves, numbness, poor healing, need for braces, arthritis, chronic pain, amputation, death.  All benefits and realistic expectations discussed in great detail.  I have  made no promises as to the outcome.  I have provided realistic expectations.  I have offered the patient a 2nd opinion, which they have declined and assured me they preferred to proceed despite the risks   No follow-ups on file.   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

## 2022-09-24 ENCOUNTER — Ambulatory Visit: Payer: Medicare HMO | Attending: Family Medicine | Admitting: Pharmacist

## 2022-09-24 DIAGNOSIS — E1169 Type 2 diabetes mellitus with other specified complication: Secondary | ICD-10-CM | POA: Diagnosis not present

## 2022-09-24 DIAGNOSIS — Z794 Long term (current) use of insulin: Secondary | ICD-10-CM | POA: Diagnosis not present

## 2022-09-24 LAB — POCT GLYCOSYLATED HEMOGLOBIN (HGB A1C): HbA1c, POC (controlled diabetic range): 8.5 % — AB (ref 0.0–7.0)

## 2022-09-24 MED ORDER — METFORMIN HCL ER 500 MG PO TB24: ORAL_TABLET | ORAL | 2 refills | Status: DC

## 2022-09-24 NOTE — Progress Notes (Signed)
S:     No chief complaint on file.  67 y.o. female who presents for diabetes evaluation, education, and management.  PMH is significant for T2DM, HLD, GERD. Patient was referred and last seen by Primary Care Provider, Dr. Andrey Campanile, on 08/13/2022. At that visit, A1c is showing as cancelled. Pharmacy saw her last on 07/23/2022. She endorsed fasting blood sugar in the 130-170 range. We continued the Ozempic and increased her Lantus to 50u once daily. We continued her metformin dose. Since that visit with me, pt was seen in the ED 09/09/22 and dx with cellulitis of her L great toe. Just completed antibiotics last Friday but reports that her sugars have been running higher d/t infection.   Today, patient arrives in good spirits and presents without any assistance. No complaints today other than her sugar being higher than normal with her recent cellulitis. A1c today shows some improvement from 8.9 down to 8.5%.  Family/Social History:  Fhx: MI, asthma Tobacco: former smoker (quit in 2017) Alcohol: none reported   Current diabetes medications include: Lantus 50 daily, metformin 500 mg XR (1000mg  daily), Ozempic 2 mg weekly  Patient reports adherence to taking all medications as prescribed.   Insurance coverage: [employer]  Patient denies hypoglycemic events.  Reported home fasting blood sugars: 130 - 170  Patient denies nocturia (nighttime urination).  Patient denies neuropathy (nerve pain). Patient denies visual changes. Patient reports self foot exams.   Patient reported dietary habits:  -Mostly adherent to a diabetic diet.   Patient-reported exercise habits:  -None reported   O:  7 day average blood glucose: no meter with her today   No CGM in place.   Lab Results  Component Value Date   HGBA1C 8.5 (A) 09/24/2022   There were no vitals filed for this visit.  Lipid Panel     Component Value Date/Time   CHOL 150 08/13/2022 1054   TRIG 144 08/13/2022 1054   HDL 42  08/13/2022 1054   CHOLHDL 3.6 08/13/2022 1054   LDLCALC 83 08/13/2022 1054    Clinical Atherosclerotic Cardiovascular Disease (ASCVD): No  The 10-year ASCVD risk score (Arnett DK, et al., 2019) is: 12.4%   Values used to calculate the score:     Age: 52 years     Sex: Female     Is Non-Hispanic African American: No     Diabetic: Yes     Tobacco smoker: No     Systolic Blood Pressure: 110 mmHg     Is BP treated: Yes     HDL Cholesterol: 42 mg/dL     Total Cholesterol: 150 mg/dL   A/P: Diabetes longstanding currently above goal based on her A1c. Today's A1c is improved from prior but still above goal. Patient is able to verbalize appropriate hypoglycemia management plan. Medication adherence appears appropriate. -Continue Lantus to 50 units once daily. -Continue Ozempic 2mg  once weekly.  -Increase metformin 500 mg XR to 1000 mg BID (2 tablets BID) at current dosing. Patient advised to message me in 1 week after this increase. If sugars are still above goal, we will need to adjust insulin. -Extensively discussed pathophysiology of diabetes, recommended lifestyle interventions, dietary effects on blood sugar control.  -Counseled on s/sx of and management of hypoglycemia.  -Next A1c anticipated 12/2022.  Written patient instructions provided. Patient verbalized understanding of treatment plan.  Total time in face to face counseling 20 minutes.    Follow-up:  Pharmacist in 6 weeks.  Butch Penny, PharmD,  BCACP, CPP Clinical Pharmacist [facility] [phone]

## 2022-09-29 ENCOUNTER — Other Ambulatory Visit: Payer: Self-pay | Admitting: Family Medicine

## 2022-09-30 MED ORDER — MECLIZINE HCL 25 MG PO TABS: 25.0000 mg | ORAL_TABLET | Freq: Three times a day (TID) | ORAL | 0 refills | Status: DC | PRN

## 2022-09-30 MED ORDER — GLUCOSE BLOOD VI STRP: ORAL_STRIP | 5 refills | Status: AC

## 2022-09-30 MED ORDER — FLUOXETINE HCL 20 MG PO CAPS: 20.0000 mg | ORAL_CAPSULE | Freq: Every day | ORAL | 0 refills | Status: DC

## 2022-09-30 MED ORDER — ALBUTEROL SULFATE HFA 108 (90 BASE) MCG/ACT IN AERS: 2.0000 | INHALATION_SPRAY | Freq: Four times a day (QID) | RESPIRATORY_TRACT | 5 refills | Status: AC | PRN

## 2022-09-30 MED ORDER — MISC. DEVICES MISC: 0 refills | Status: AC

## 2022-10-01 ENCOUNTER — Encounter: Payer: Self-pay | Admitting: Family Medicine

## 2022-10-01 ENCOUNTER — Other Ambulatory Visit: Payer: Self-pay | Admitting: Pharmacist

## 2022-10-01 MED ORDER — TOUJEO SOLOSTAR 300 UNIT/ML ~~LOC~~ SOPN: 56.0000 [IU] | PEN_INJECTOR | Freq: Every day | SUBCUTANEOUS | 6 refills | Status: DC

## 2022-10-01 NOTE — Telephone Encounter (Signed)
From patient.

## 2022-10-08 ENCOUNTER — Encounter: Payer: Self-pay | Admitting: Family Medicine

## 2022-10-20 ENCOUNTER — Other Ambulatory Visit: Payer: Self-pay | Admitting: Podiatry

## 2022-10-20 ENCOUNTER — Ambulatory Visit: Payer: Medicare HMO | Admitting: Podiatry

## 2022-10-20 ENCOUNTER — Ambulatory Visit (INDEPENDENT_AMBULATORY_CARE_PROVIDER_SITE_OTHER): Payer: Medicare HMO

## 2022-10-20 ENCOUNTER — Encounter: Payer: Self-pay | Admitting: Podiatry

## 2022-10-20 DIAGNOSIS — M2041 Other hammer toe(s) (acquired), right foot: Secondary | ICD-10-CM | POA: Diagnosis not present

## 2022-10-20 DIAGNOSIS — M79672 Pain in left foot: Secondary | ICD-10-CM

## 2022-10-20 DIAGNOSIS — L03032 Cellulitis of left toe: Secondary | ICD-10-CM

## 2022-10-20 MED ORDER — DOXYCYCLINE HYCLATE 100 MG PO TABS: 100.0000 mg | ORAL_TABLET | Freq: Two times a day (BID) | ORAL | 1 refills | Status: DC

## 2022-10-20 NOTE — Patient Instructions (Signed)

## 2022-10-20 NOTE — Progress Notes (Signed)
Subjective:   Patient ID: Amy Garner, female   DOB: 67 y.o.   MRN: [id number]   HPI Patient presents stating she is getting a lot of drainage and redness in her left big toe.  Going to have surgery in the fall with Dr. Allena Katz for right first MPJ neurovascular status   ROS      Objective:  Physical Exam  Intact with patient found to have redness and inflammation of the left hallux distal to the inner phalangeal joint with drainage on the lateral side does have hammertoe deformity digits 23 4 5  right and she wants to discuss those with me today     Assessment:  Paronychia of the left hallux appears localized no current systemic element with patient having taken cephalexin for this and digital deformities right with scheduled correction of big toe second toe third toe      Plan:  H&P reviewed and I have recommended treating the infection left I explained the procedure to patient I anesthetized 60 mg like Marcaine mixture sterile prep done and using sterile instrumentation I removed the nailbed and proud flesh formation from the lateral side created area for drainage flushed and applied sterile dressing instructed on soaks and for the right fourth and fifth toes I do think that digital fusion of the toe of the fourth and arthroplasty fifth is appropriate with the other surgeries reviewed that with her and she is to see Dr. Allena Katz prior to the procedure

## 2022-10-21 ENCOUNTER — Encounter: Payer: Self-pay | Admitting: *Deleted

## 2022-10-21 NOTE — Progress Notes (Signed)
[facility] Quality Team Note  Name: Amy Garner Date of Birth: August 25, 1955 MRN: [id number] Date: 10/21/2022  [facility] Quality Team has reviewed this patient's chart, please see recommendations below:  [facility] Quality Other; Pt has open gaps for AWV, mammogram, diabetic retinopathy screening, KED measure.  AWV not due until 04/2023. I will call to address EED to see if pt will attend eye event.  Could provider address mammogram and Urine Albumin Creatinine Ratio at her ov on 01/21/2023?

## 2022-10-29 ENCOUNTER — Ambulatory Visit: Payer: Medicare HMO | Attending: Family Medicine | Admitting: Pharmacist

## 2022-10-29 DIAGNOSIS — Z794 Long term (current) use of insulin: Secondary | ICD-10-CM | POA: Diagnosis not present

## 2022-10-29 DIAGNOSIS — E1169 Type 2 diabetes mellitus with other specified complication: Secondary | ICD-10-CM

## 2022-10-29 MED ORDER — TOUJEO SOLOSTAR 300 UNIT/ML ~~LOC~~ SOPN: 62.0000 [IU] | PEN_INJECTOR | Freq: Every day | SUBCUTANEOUS | 6 refills | Status: DC

## 2022-10-29 NOTE — Progress Notes (Signed)
S:     No chief complaint on file.  67 y.o. female who presents for diabetes evaluation, education, and management.  PMH is significant for T2DM, HTN, HLD, GERD.   Patient was referred and last seen by Primary Care Provider, Dr. Andrey Campanile, on 08/13/2022. At this visit, A1c was cancelled. Last seen by pharmacy clinic on 09/24/2022, where A1c was 8.5% down from 8.9%. Metformin was increased from 500 mg BID to 1000 mg BID. A couple weeks later (5/17) she reported fasting blood sugar in the 120-320 range. Toujeo was increased from 50 to 56 units. A week later (5/24) she reported fasting blood sugar in the 90-140 range. Continued the ozempic and metformin dose.  Today, patient arrives in good spirits and presents without any assistance. Reports she started another round of antibiotic to treat cellulitis of her L great toe. Due to infection, her blood sugars have elevated into the 170-240 range. Patient concerns is to get blood sugar under control before her surgery in September. She is adherent to her medications. Denies hypoglycemic events, nocturia, neuropathy, visual changes. Reports self foot exams.  Current diabetes medications include: Ozempic 2 mg weekly, toujeo 56 units daily, metformin 1000 mg BID Current hypertension medications include: losartan 25 mg daily Current hyperlipidemia medications include: lovastatin 10 mg daily  Family/Social History:  Fhx: MI, asthma Tobacco: former smoker (quit in 2017)  Insurance coverage: [employer]  O:   ROS  Physical Exam  Lab Results  Component Value Date   HGBA1C 8.5 (A) 09/24/2022   There were no vitals filed for this visit.  Lipid Panel     Component Value Date/Time   CHOL 150 08/13/2022 1054   TRIG 144 08/13/2022 1054   HDL 42 08/13/2022 1054   CHOLHDL 3.6 08/13/2022 1054   LDLCALC 83 08/13/2022 1054    Clinical Atherosclerotic Cardiovascular Disease (ASCVD): No  The 10-year ASCVD risk score (Arnett DK, et al., 2019) is:  13.8%   Values used to calculate the score:     Age: 73 years     Sex: Female     Is Non-Hispanic African American: No     Diabetic: Yes     Tobacco smoker: No     Systolic Blood Pressure: 110 mmHg     Is BP treated: Yes     HDL Cholesterol: 42 mg/dL     Total Cholesterol: 150 mg/dL   Patient is participating in a Managed Medicaid Plan: No   A/P: Diabetes longstanding, currently uncontrolled. Patient is able to verbalize appropriate hypoglycemia management plan. Medication adherence appears appropriate.Control is suboptimal due to recent reinfection. -Increased dose of Tresiba from 56 units to 62 units daily. Patient will report what her blood sugars are for the next week on 11/05/2022. -Continued Ozempic and Metformin at current dose.  -Patient educated on purpose, proper use, and potential adverse effects.  -Extensively discussed pathophysiology of diabetes, recommended lifestyle interventions, dietary effects on blood sugar control.  -Counseled on s/sx of and management of hypoglycemia.  -Next A1c anticipated August 2024.   ASCVD risk - primary prevention in patient with diabetes. Last LDL is 88, at goal of <70 mg/dL. ASCVD risk factors include T2DM, HTN and 10-year ASCVD risk score of 13.8%. low intensity statin indicated.  -Continued lovastatin 10 mg.   Hypertension longstanding currently controlled based on office BP. Blood pressure goal of <130/80 mmHg. Medication adherence appropriate. -Continued losartan 25 mg daily.  Written patient instructions provided. Patient verbalized understanding of treatment plan.  Total time in face to face counseling 20 minutes.    Follow-up:  Pharmacist in 4-6 weeks. Encouraged pt to send MyChart with blood sugar readings at home.    Patient seen with  Alesia Banda, PharmD Candidate [employer] 20[employer]    Butch Penny, PharmD, [place], CPP Clinical Pharmacist [facility] [phone]

## 2022-11-06 ENCOUNTER — Other Ambulatory Visit: Payer: Self-pay | Admitting: Family Medicine

## 2022-11-08 NOTE — Telephone Encounter (Signed)
Requested Prescriptions  Pending Prescriptions Disp Refills   OZEMPIC, 2 MG/DOSE, 8 MG/3ML SOPN [Pharmacy Med Name: OZEMPIC 8 MG/3ML Solution Pen-injector] 9 mL 1    Sig: INJECT 2MG  UNDER THE SKIN ONE TIME WEEKLY AS DIRECTED     Endocrinology:  Diabetes - GLP-1 Receptor Agonists - semaglutide Failed - 11/06/2022 12:59 AM      Failed - HBA1C in normal range and within 180 days    HbA1c, POC (controlled diabetic range)  Date Value Ref Range Status  09/24/2022 8.5 (A) 0.0 - 7.0 % Final         Passed - Cr in normal range and within 360 days    Creatinine, Ser  Date Value Ref Range Status  08/13/2022 0.92 0.57 - 1.00 mg/dL Final         Passed - Valid encounter within last 6 months    Recent Outpatient Visits           1 week ago Type 2 diabetes mellitus with other specified complication, with long-term current use of insulin (HCC)   [place] [facility] [employer], [place] L, RPH-CPP   1 month ago Type 2 diabetes mellitus with other specified complication, with long-term current use of insulin [facility])   [place] [facility] [place], [place] L, RPH-CPP   2 months ago Type 2 diabetes mellitus with other specified complication, without long-term current use of insulin (HCC)   [place] [facility], Lauris Poag, MD   3 months ago Type 2 diabetes mellitus with other specified complication, with long-term current use of insulin [facility])   [facility] [place], [place] L, RPH-CPP   4 months ago Type 2 diabetes mellitus with other specified complication, with long-term current use of insulin [facility])   [place] [facility] Drucilla Chalet, RPH-CPP       Future Appointments             In 1 month Lois Huxley, Cornelius Moras, RPH-CPP [facility]   In 2 months Georganna Skeans, MD [facility]

## 2022-11-11 ENCOUNTER — Encounter: Payer: Self-pay | Admitting: Family Medicine

## 2022-11-27 ENCOUNTER — Encounter: Payer: Self-pay | Admitting: Podiatry

## 2022-12-10 ENCOUNTER — Ambulatory Visit: Payer: Medicare HMO | Admitting: Pharmacist

## 2022-12-15 ENCOUNTER — Ambulatory Visit: Payer: Medicare HMO | Admitting: Podiatry

## 2022-12-15 DIAGNOSIS — M19071 Primary osteoarthritis, right ankle and foot: Secondary | ICD-10-CM

## 2022-12-15 DIAGNOSIS — M2041 Other hammer toe(s) (acquired), right foot: Secondary | ICD-10-CM

## 2022-12-15 NOTE — Progress Notes (Addendum)
Subjective:  Patient ID: Amy Garner, female    DOB: Dec 16, 1955,  MRN: [id number]  Chief Complaint  Patient presents with   surgery paperwork     To sign new consent form     67 y.o. female presents with the above complaint.  Patient presents with right severe bunion deformity with underlying arthritic changes.  She also has hammertoe contracture of second/third/fourth/fifth digit She states pain for touch is progressive gotten worse she is tried over-the-counter options none of which has helped.  She has tried shoe gear modification, orthotics padding protecting for more than 3 months.  She would like to discuss surgical options.  Denies any other acute issues.  Pain scale 7 out of 10 dull achy in nature   Review of Systems: Negative except as noted in the HPI. Denies N/V/F/Ch.  Past Medical History:  Diagnosis Date   Anxiety    Asthma    When ill   Cervical radiculopathy    Chronic pain syndrome    Depression    GERD (gastroesophageal reflux disease)    Hyperlipidemia    Hypertension    Peptic ulcer    Type 2 diabetes mellitus (HCC)     Current Outpatient Medications:    albuterol (VENTOLIN HFA) 108 (90 Base) MCG/ACT inhaler, Inhale 2 puffs into the lungs every 6 (six) hours as needed for wheezing or shortness of breath., Disp: 18 g, Rfl: 5   Alcohol Swabs PADS, USE TO TAKE BLOOD SUGAR 2 TIMES A DAY, Disp: 120 each, Rfl: 2   cholecalciferol (VITAMIN D3) 25 MCG (1000 UNIT) tablet, Take 1,000 Units by mouth daily., Disp: , Rfl:    doxycycline (VIBRA-TABS) 100 MG tablet, Take 1 tablet (100 mg total) by mouth 2 (two) times daily., Disp: 20 tablet, Rfl: 1   escitalopram (LEXAPRO) 20 MG tablet, Take 1 tablet (20 mg total) by mouth daily., Disp: 30 tablet, Rfl: 2   esomeprazole (NEXIUM) 40 MG capsule, Take 1 capsule (40 mg total) by mouth in the morning., Disp: 90 capsule, Rfl: 1   FLUoxetine (PROZAC) 20 MG capsule, Take 1 capsule (20 mg total) by mouth daily., Disp: 90  capsule, Rfl: 0   glucose blood (TRUE METRIX BLOOD GLUCOSE TEST) test strip, Use as instructed, Disp: 100 each, Rfl: 12   glucose blood test strip, Use as instructed, Disp: 100 each, Rfl: 5   insulin glargine, 1 Unit Dial, (TOUJEO SOLOSTAR) 300 UNIT/ML Solostar Pen, Inject 62 Units into the skin daily., Disp: 4.5 mL, Rfl: 6   Insulin Pen Needle (PEN NEEDLES) 32G X 4 MM MISC, Use to inject insulin once daily., Disp: 100 each, Rfl: 3   losartan (COZAAR) 25 MG tablet, TAKE 1 TABLET EVERY DAY, Disp: 90 tablet, Rfl: 3   lovastatin (MEVACOR) 10 MG tablet, TAKE 1 TABLET AT BEDTIME, Disp: 90 tablet, Rfl: 3   meclizine (ANTIVERT) 25 MG tablet, Take 1 tablet (25 mg total) by mouth 3 (three) times daily as needed for dizziness., Disp: 30 tablet, Rfl: 0   metFORMIN (GLUCOPHAGE-XR) 500 MG 24 hr tablet, TAKE 2 TABLETS twice daily with meals., Disp: 360 tablet, Rfl: 2   Misc. Devices MISC, TRUE MATRIX METER, Disp: 1 each, Rfl: 0   Semaglutide, 2 MG/DOSE, (OZEMPIC, 2 MG/DOSE,) 8 MG/3ML SOPN, INJECT 2MG  UNDER THE SKIN ONE TIME WEEKLY AS DIRECTED, Disp: 9 mL, Rfl: 1   triamterene-hydrochlorothiazide (DYAZIDE) 37.5-25 MG capsule, TAKE 1 CAPSULE EVERY DAY, Disp: 90 capsule, Rfl: 3   TRUEplus Lancets 33G MISC, USE  TO TAKE BLOOD SUGAR 2 TIMES A DAY, Disp: 100 each, Rfl: 2  Social History   Tobacco Use  Smoking Status Former   Current packs/day: 0.00   Average packs/day: 1 pack/day for 15.0 years (15.0 ttl pk-yrs)   Types: Cigarettes   Start date: 2002   Quit date: 2017   Years since quitting: 7.5  Smokeless Tobacco Never    Allergies  Allergen Reactions   Atenolol Anaphylaxis   Codeine Other (See Comments)    Pt says she gets "crazy"   Objective:  There were no vitals filed for this visit. There is no height or weight on file to calculate BMI. Constitutional Well developed. Well nourished.  Vascular Dorsalis pedis pulses palpable bilaterally. Posterior tibial pulses palpable bilaterally. Capillary  refill normal to all digits.  No cyanosis or clubbing noted. Pedal hair growth normal.  Neurologic Normal speech. Oriented to person, place, and time. Epicritic sensation to light touch grossly present bilaterally.  Dermatologic Nails well groomed and normal in appearance. No open wounds. No skin lesions.  Orthopedic: Pain on palpation of right first metatarsophalangeal joint clinical crepitus noted at the first metatarsophalangeal joint limited range of motion of the fourth MTPJ.  Hammertoe contracture noted second/third/fourth/fifth digit with Joint contracture at the metatarsophalangeal joint.  Semiflexible in nature.   Radiographs: 3 views of skeletally mature adult right foot: severe bunion deformity noted with osteoarthritis of the first metatarsophalangeal joint hammertoe contracture of second and third digit noted. Assessment:   No diagnosis found.  Plan:  Patient was evaluated and treated and all questions answered.  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  She has failed conservative care for greater than 3 months of injection shoe gear modification padding protecting offloading.  Given the amount of pain that she is having she will benefit from first metatarsophalangeal joint fusion with hammertoe correction of second and third digit with capsulotomy of the second/third/fourth/fifth digit metatarsal phalangeal joint with fixation -I discussed my preoperative intra postop plan in extensive detail she states understand like to proceed with surgery. -Informed surgical risk consent was reviewed and read aloud to the patient.  I reviewed the films.  I have discussed my findings with the patient in great detail.  I have discussed all risks including but not limited to infection, stiffness, scarring, limp,  disability, deformity, damage to blood vessels and nerves, numbness, poor healing, need for braces, arthritis, chronic pain, amputation, death.  All benefits and realistic expectations discussed in great detail.  I have made no promises as to the outcome.  I have provided realistic expectations.  I have offered the patient a 2nd opinion, which they have declined and assured me they preferred to proceed despite the risks   No follow-ups on file.

## 2023-01-14 ENCOUNTER — Telehealth: Payer: Self-pay | Admitting: Urology

## 2023-01-14 NOTE — Telephone Encounter (Signed)
DOS - 02/07/23  HAMMERTOE REPAIR 2/3 RIGHT --- 09811 HALLUX MPJ FUSION LEFT --- 91478 CAPSULOTOMY MPJ RELEASE JOINT 2/3 RIGHT --- 28270  [employer]    PER [employer] [employer] FOR CPT CODE 29562 NO PRIOR AUTH IS REQUIRED, FOR CPT CODES 28285 AND 28750 HAVE BEEN APPROVED, AUTH # [id number], GOOD FROM 02/07/23 - 04/09/23.

## 2023-01-16 ENCOUNTER — Other Ambulatory Visit: Payer: Self-pay | Admitting: Family Medicine

## 2023-01-21 ENCOUNTER — Encounter: Payer: Self-pay | Admitting: Family Medicine

## 2023-01-21 ENCOUNTER — Ambulatory Visit (INDEPENDENT_AMBULATORY_CARE_PROVIDER_SITE_OTHER): Payer: Medicare HMO | Admitting: Family Medicine

## 2023-01-21 VITALS — BP 115/72 | HR 77 | Temp 98.6°F | Resp 18 | Ht 66.0 in | Wt 228.6 lb

## 2023-01-21 DIAGNOSIS — E1169 Type 2 diabetes mellitus with other specified complication: Secondary | ICD-10-CM

## 2023-01-21 DIAGNOSIS — Z01818 Encounter for other preprocedural examination: Secondary | ICD-10-CM | POA: Diagnosis not present

## 2023-01-21 DIAGNOSIS — Z7984 Long term (current) use of oral hypoglycemic drugs: Secondary | ICD-10-CM | POA: Diagnosis not present

## 2023-01-21 LAB — POCT GLYCOSYLATED HEMOGLOBIN (HGB A1C): Hemoglobin A1C: 7 % — AB (ref 4.0–5.6)

## 2023-01-21 NOTE — Progress Notes (Signed)
Established Patient Office Visit  Subjective    Patient ID: Amy Garner, female    DOB: May 30, 1955  Age: 67 y.o. MRN: [id number]  CC:  Chief Complaint  Patient presents with   Follow-up    Follow up for surgery clearance    HPI Amy Garner presents for pre-op work up for right fooot surgery scheduled for 02/07/2023 by Dr Nicholes Rough.   Outpatient Encounter Medications as of 01/21/2023  Medication Sig   albuterol (VENTOLIN HFA) 108 (90 Base) MCG/ACT inhaler Inhale 2 puffs into the lungs every 6 (six) hours as needed for wheezing or shortness of breath.   Alcohol Swabs PADS USE TO TAKE BLOOD SUGAR 2 TIMES A DAY   cholecalciferol (VITAMIN D3) 25 MCG (1000 UNIT) tablet Take 1,000 Units by mouth daily.   escitalopram (LEXAPRO) 20 MG tablet Take 1 tablet (20 mg total) by mouth daily.   esomeprazole (NEXIUM) 40 MG capsule TAKE 1 CAPSULE (40 MG TOTAL) BY MOUTH IN THE MORNING.   FLUoxetine (PROZAC) 20 MG capsule Take 1 capsule (20 mg total) by mouth daily.   glucose blood (TRUE METRIX BLOOD GLUCOSE TEST) test strip Use as instructed   glucose blood test strip Use as instructed   insulin glargine, 1 Unit Dial, (TOUJEO SOLOSTAR) 300 UNIT/ML Solostar Pen Inject 62 Units into the skin daily.   Insulin Pen Needle (PEN NEEDLES) 32G X 4 MM MISC Use to inject insulin once daily.   losartan (COZAAR) 25 MG tablet TAKE 1 TABLET EVERY DAY   lovastatin (MEVACOR) 10 MG tablet TAKE 1 TABLET AT BEDTIME   meclizine (ANTIVERT) 25 MG tablet Take 1 tablet (25 mg total) by mouth 3 (three) times daily as needed for dizziness.   metFORMIN (GLUCOPHAGE-XR) 500 MG 24 hr tablet TAKE 2 TABLETS twice daily with meals.   Misc. Devices MISC TRUE MATRIX METER   Semaglutide, 2 MG/DOSE, (OZEMPIC, 2 MG/DOSE,) 8 MG/3ML SOPN INJECT 2MG  UNDER THE SKIN ONE TIME WEEKLY AS DIRECTED   triamterene-hydrochlorothiazide (DYAZIDE) 37.5-25 MG capsule TAKE 1 CAPSULE EVERY DAY   TRUEplus Lancets 33G MISC USE TO TAKE BLOOD  SUGAR 2 TIMES A DAY   doxycycline (VIBRA-TABS) 100 MG tablet Take 1 tablet (100 mg total) by mouth 2 (two) times daily. (Patient not taking: Reported on 01/21/2023)   No facility-administered encounter medications on file as of 01/21/2023.    Past Medical History:  Diagnosis Date   Anxiety    Asthma    When ill   Cervical radiculopathy    Chronic pain syndrome    Depression    GERD (gastroesophageal reflux disease)    Hyperlipidemia    Hypertension    Peptic ulcer    Type 2 diabetes mellitus (HCC)     Past Surgical History:  Procedure Laterality Date   BUNIONECTOMY     CERVICAL DISC SURGERY     CHOLECYSTECTOMY     DIAGNOSTIC LAPAROSCOPY  1980   endometriosis   INGUINAL HERNIA REPAIR Left 05/02/2020   Procedure: OPEN LEFT INGUINAL HERNIA REPAIR WITH MESH, TAP BLOCK;  Surgeon: Berna Bue, MD;  Location: [facility];  Service: General;  Laterality: Left;   TUBAL LIGATION  1990s    Family History  Problem Relation Age of Onset   Heart attack Mother    Asthma Mother    Heart disease Father    Stomach cancer Paternal Aunt    Colon cancer Paternal Aunt     Social History   Socioeconomic History  Marital status: Widowed    Spouse name: Not on file   Number of children: Not on file   Years of education: Not on file   Highest education level: Associate degree: occupational, Scientist, product/process development, or vocational program  Occupational History   Not on file  Tobacco Use   Smoking status: Former    Current packs/day: 0.00    Average packs/day: 1 pack/day for 15.0 years (15.0 ttl pk-yrs)    Types: Cigarettes    Start date: 2002    Quit date: 2017    Years since quitting: 7.6   Smokeless tobacco: Never  Vaping Use   Vaping status: Never Used  Substance and Sexual Activity   Alcohol use: Never   Drug use: Never   Sexual activity: Not on file  Other Topics Concern   Not on file  Social History Narrative   Not on file   Social Determinants of Health   Financial Resource  Strain: Low Risk  (01/21/2023)   Overall Financial Resource Strain (CARDIA)    Difficulty of Paying Living Expenses: Not hard at all  Food Insecurity: Food Insecurity Present (08/09/2022)   Hunger Vital Sign    Worried About Running Out of Food in the Last Year: Sometimes true    Ran Out of Food in the Last Year: Sometimes true  Transportation Needs: No Transportation Needs (01/21/2023)   PRAPARE - Administrator, Civil Service (Medical): No    Lack of Transportation (Non-Medical): No  Physical Activity: Sufficiently Active (01/21/2023)   Exercise Vital Sign    Days of Exercise per Week: 7 days    Minutes of Exercise per Session: 30 min  Stress: No Stress Concern Present (01/21/2023)   [employer] of Occupational Health - Occupational Stress Questionnaire    Feeling of Stress : Not at all  Social Connections: Socially Isolated (01/21/2023)   Social Connection and Isolation Panel [NHANES]    Frequency of Communication with Friends and Family: More than three times a week    Frequency of Social Gatherings with Friends and Family: More than three times a week    Attends Religious Services: Never    Database administrator or Organizations: No    Attends Banker Meetings: Never    Marital Status: Widowed  Intimate Partner Violence: Not At Risk (01/21/2023)   Humiliation, Afraid, Rape, and Kick questionnaire    Fear of Current or Ex-Partner: No    Emotionally Abused: No    Physically Abused: No    Sexually Abused: No    Review of Systems  All other systems reviewed and are negative.       Objective    BP 115/72 (BP Location: Right Arm, Patient Position: Sitting, Cuff Size: Large)   Pulse 77   Temp 98.6 F (37 C) (Oral)   Resp 18   Ht 5\' 6"  (1.676 m)   Wt 228 lb 9.6 oz (103.7 kg)   SpO2 93%   BMI 36.90 kg/m   Physical Exam Vitals and nursing note reviewed.  Constitutional:      General: She is not in acute distress.    Appearance: She is obese.   Cardiovascular:     Rate and Rhythm: Normal rate and regular rhythm.  Pulmonary:     Effort: Pulmonary effort is normal.     Breath sounds: Normal breath sounds.  Abdominal:     Palpations: Abdomen is soft.     Tenderness: There is no abdominal tenderness.  Neurological:  General: No focal deficit present.     Mental Status: She is alert and oriented to person, place, and time.         Assessment & Plan:   Preop examination  Type 2 diabetes mellitus with other specified complication, without long-term current use of insulin (HCC) -     POCT glycosylated hemoglobin (Hb A1C)  Appears medically stable to undergo the stated procedure.    No follow-ups on file.   Tommie Raymond, MD

## 2023-01-21 NOTE — Progress Notes (Signed)
Follow up for surgery clearance

## 2023-01-26 ENCOUNTER — Other Ambulatory Visit: Payer: Self-pay | Admitting: Obstetrics & Gynecology

## 2023-01-26 DIAGNOSIS — Z1231 Encounter for screening mammogram for malignant neoplasm of breast: Secondary | ICD-10-CM

## 2023-01-27 ENCOUNTER — Ambulatory Visit
Admission: RE | Admit: 2023-01-27 | Discharge: 2023-01-27 | Disposition: A | Discharge status: Home / Self Care | Payer: Medicare HMO | Source: Ambulatory Visit

## 2023-01-27 DIAGNOSIS — Z1231 Encounter for screening mammogram for malignant neoplasm of breast: Secondary | ICD-10-CM | POA: Diagnosis not present

## 2023-01-28 ENCOUNTER — Other Ambulatory Visit: Payer: Self-pay

## 2023-01-28 ENCOUNTER — Encounter
Admission: RE | Admit: 2023-01-28 | Discharge: 2023-01-28 | Disposition: A | Discharge status: Home / Self Care | Payer: Medicare HMO | Source: Ambulatory Visit | Attending: Podiatry | Admitting: Podiatry

## 2023-01-28 DIAGNOSIS — E119 Type 2 diabetes mellitus without complications: Secondary | ICD-10-CM

## 2023-01-28 DIAGNOSIS — Z01812 Encounter for preprocedural laboratory examination: Secondary | ICD-10-CM

## 2023-01-28 HISTORY — DX: Pneumonia, unspecified organism: J18.9

## 2023-01-28 HISTORY — DX: Bunion of right foot: M21.611

## 2023-01-28 HISTORY — DX: Unspecified osteoarthritis, unspecified site: M19.90

## 2023-01-28 NOTE — Patient Instructions (Addendum)
Your procedure is scheduled on: 02/07/23 - Monday Report to the Registration Desk on the 1st floor of the [facility]. To find out your arrival time, please call [phone] between 1PM - 3PM on: 02/04/23 - Friday If your arrival time is 6:00 am, do not arrive before that time as the Medical Mall entrance doors do not open until 6:00 am. Report to [facility] - [address] [place], [address],  at 2:00 PM 01/31/23 for labs and EKG.  REMEMBER: Instructions that are not followed completely may result in serious medical risk, up to and including death; or upon the discretion of your surgeon and anesthesiologist your surgery may need to be rescheduled.  Do not eat food after midnight the night before surgery.  No gum chewing or hard candies.  You may drink water up to 2 hours before you are scheduled to arrive for your surgery. Do not drink anything within 2 hours of your scheduled arrival time.   One week prior to surgery: Stop Anti-inflammatories (NSAIDS) such as Advil, Aleve, Ibuprofen, Motrin, Naproxen, Naprosyn and Aspirin based products such as Excedrin, Goody's Powder, BC Powder. You may take Tylenol if needed for pain up until the day of surgery.  Stop ANY OVER THE COUNTER supplements until after surgery.   Continue taking all prescribed medications with the exception of the following:  insulin glargine, (TOUJEO SOLOSTAR) hold the morning of surgery. metFORMIN (GLUCOPHAGE-XR) hold beginning 02/05/23. Semaglutide, (OZEMPIC) hold for 7 days prior to your surgery   TAKE ONLY THESE MEDICATIONS THE MORNING OF SURGERY WITH A SIP OF WATER:  esomeprazole (NEXIUM) - (take one the night before and one on the morning of surgery - helps to prevent nausea after surgery.) escitalopram (LEXAPRO)  FLUoxetine (PROZAC)    Use inhaler albuterol (VENTOLIN HFA)  on the day of surgery and bring to the hospital.  No Alcohol for 24 hours before or after surgery.  No  Smoking including e-cigarettes for 24 hours before surgery.  No chewable tobacco products for at least 6 hours before surgery.  No nicotine patches on the day of surgery.  Do not use any "recreational" drugs for at least a week (preferably 2 weeks) before your surgery.  Please be advised that the combination of cocaine and anesthesia may have negative outcomes, up to and including death. If you test positive for cocaine, your surgery will be cancelled.  On the morning of surgery brush your teeth with toothpaste and water, you may rinse your mouth with mouthwash if you wish. Do not swallow any toothpaste or mouthwash.  Use CHG Soap or wipes as directed on instruction sheet.  Do not wear jewelry, make-up, hairpins, clips or nail polish.  For welded (permanent) jewelry: bracelets, anklets, waist bands, etc.  Please have this removed prior to surgery.  If it is not removed, there is a chance that hospital personnel will need to cut it off on the day of surgery.  Do not wear lotions, powders, or perfumes.   Do not shave body hair from the neck down 48 hours before surgery.  Contact lenses, hearing aids and dentures may not be worn into surgery.  Do not bring valuables to the hospital. [facility] is not responsible for any missing/lost belongings or valuables.   Notify your doctor if there is any change in your medical condition (cold, fever, infection).  Wear comfortable clothing (specific to your surgery type) to the hospital.  After surgery, you can help prevent  lung complications by doing breathing exercises.  Take deep breaths and cough every 1-2 hours. Your doctor may order a device called an Incentive Spirometer to help you take deep breaths. When coughing or sneezing, hold a pillow firmly against your incision with both hands. This is called "splinting." Doing this helps protect your incision. It also decreases belly discomfort.  If you are being admitted to the hospital  overnight, leave your suitcase in the car. After surgery it may be brought to your room.  In case of increased patient census, it may be necessary for you, the patient, to continue your postoperative care in the Same Day Surgery department.  If you are being discharged the day of surgery, you will not be allowed to drive home. You will need a responsible individual to drive you home and stay with you for 24 hours after surgery.   If you are taking public transportation, you will need to have a responsible individual with you.  Please call the Pre-admissions Testing Dept. at [phone] if you have any questions about these instructions.  Surgery Visitation Policy:  Patients having surgery or a procedure may have two visitors.  Children under the age of 32 must have an adult with them who is not the patient.  Inpatient Visitation:    Visiting hours are 7 a.m. to 8 p.m. Up to four visitors are allowed at one time in a patient room. The visitors may rotate out with other people during the day.  One visitor age 55 or older may stay with the patient overnight and must be in the room by 8 p.m.    Preparing for Surgery with CHLORHEXIDINE GLUCONATE (CHG) Soap  Chlorhexidine Gluconate (CHG) Soap  o An antiseptic cleaner that kills germs and bonds with the skin to continue killing germs even after washing  o Used for showering the night before surgery and morning of surgery  Before surgery, you can play an important role by reducing the number of germs on your skin.  CHG (Chlorhexidine gluconate) soap is an antiseptic cleanser which kills germs and bonds with the skin to continue killing germs even after washing.  Please do not use if you have an allergy to CHG or antibacterial soaps. If your skin becomes reddened/irritated stop using the CHG.  1. Shower the NIGHT BEFORE SURGERY and the MORNING OF SURGERY with CHG soap.  2. If you choose to wash your hair, wash your hair first as  usual with your normal shampoo.  3. After shampooing, rinse your hair and body thoroughly to remove the shampoo.  4. Use CHG as you would any other liquid soap. You can apply CHG directly to the skin and wash gently with a scrungie or a clean washcloth.  5. Apply the CHG soap to your body only from the neck down. Do not use on open wounds or open sores. Avoid contact with your eyes, ears, mouth, and genitals (private parts). Wash face and genitals (private parts) with your normal soap.  6. Wash thoroughly, paying special attention to the area where your surgery will be performed.  7. Thoroughly rinse your body with warm water.  8. Do not shower/wash with your normal soap after using and rinsing off the CHG soap.  9. Pat yourself dry with a clean towel.  10. Wear clean pajamas to bed the night before surgery.  12. Place clean sheets on your bed the night of your first shower and do not sleep with pets.  13. Shower  again with the CHG soap on the day of surgery prior to arriving at the hospital.  14. Do not apply any deodorants/lotions/powders.  15. Please wear clean clothes to the hospital.

## 2023-01-31 ENCOUNTER — Other Ambulatory Visit: Payer: Self-pay | Admitting: Family Medicine

## 2023-01-31 ENCOUNTER — Encounter
Admission: RE | Admit: 2023-01-31 | Discharge: 2023-01-31 | Disposition: A | Discharge status: Home / Self Care | Payer: Medicare HMO | Source: Ambulatory Visit | Attending: Podiatry

## 2023-01-31 DIAGNOSIS — Z794 Long term (current) use of insulin: Secondary | ICD-10-CM | POA: Insufficient documentation

## 2023-01-31 DIAGNOSIS — E119 Type 2 diabetes mellitus without complications: Secondary | ICD-10-CM | POA: Diagnosis not present

## 2023-01-31 DIAGNOSIS — Z01810 Encounter for preprocedural cardiovascular examination: Secondary | ICD-10-CM | POA: Insufficient documentation

## 2023-01-31 DIAGNOSIS — Z01818 Encounter for other preprocedural examination: Secondary | ICD-10-CM | POA: Diagnosis present

## 2023-01-31 DIAGNOSIS — R9431 Abnormal electrocardiogram [ECG] [EKG]: Secondary | ICD-10-CM | POA: Diagnosis not present

## 2023-01-31 DIAGNOSIS — Z01812 Encounter for preprocedural laboratory examination: Secondary | ICD-10-CM | POA: Diagnosis not present

## 2023-01-31 LAB — BASIC METABOLIC PANEL
Anion gap: 11 (ref 5–15)
BUN: 21 mg/dL (ref 8–23)
CO2: 26 mmol/L (ref 22–32)
Calcium: 9.4 mg/dL (ref 8.9–10.3)
Chloride: 98 mmol/L (ref 98–111)
Creatinine, Ser: 0.89 mg/dL (ref 0.44–1.00)
GFR, Estimated: >60 mL/min (ref >60)
Glucose, Bld: 125 mg/dL — ABNORMAL HIGH (ref 70–99)
Potassium: 4 mmol/L (ref 3.5–5.1)
Sodium: 135 mmol/L (ref 135–145)

## 2023-01-31 LAB — CBC
HCT: 39.7 % (ref 36.0–46.0)
Hemoglobin: 13.3 g/dL (ref 12.0–15.0)
MCH: 28.6 pg (ref 26.0–34.0)
MCHC: 33.5 g/dL (ref 30.0–36.0)
MCV: 85.4 fL (ref 80.0–100.0)
Platelets: 195 10*3/uL (ref 150–400)
RBC: 4.65 MIL/uL (ref 3.87–5.11)
RDW: 12.9 % (ref 11.5–15.5)
WBC: 9.4 10*3/uL (ref 4.0–10.5)
nRBC: 0 % (ref 0.0–0.2)

## 2023-01-31 NOTE — Telephone Encounter (Signed)
Medication Refill - Medication: escitalopram (LEXAPRO) 20 MG tablet [[id number]]   Has the patient contacted their pharmacy? Yes.     (Agent: If yes, when and what did the pharmacy advise?) Contact PCP   Preferred Pharmacy (with phone number or street name): [facility] - [place], [place] - [address]   Has the patient been seen for an appointment in the last year OR does the patient have an upcoming appointment? Yes.    Agent: Please be advised that RX refills may take up to 3 business days. We ask that you follow-up with your pharmacy.   Pt says she usually gets a 90 day supply

## 2023-02-01 MED ORDER — ESCITALOPRAM OXALATE 20 MG PO TABS: 20.0000 mg | ORAL_TABLET | Freq: Every day | ORAL | 0 refills | Status: DC

## 2023-02-01 NOTE — Telephone Encounter (Signed)
Requested Prescriptions  Pending Prescriptions Disp Refills   escitalopram (LEXAPRO) 20 MG tablet 90 tablet 0    Sig: Take 1 tablet (20 mg total) by mouth daily.     Psychiatry:  Antidepressants - SSRI Passed - 01/31/2023 11:05 AM      Passed - Valid encounter within last 6 months    Recent Outpatient Visits           1 week ago Preop examination   [facility], Lauris Poag, MD   3 months ago Type 2 diabetes mellitus with other specified complication, with long-term current use of insulin [facility])   [facility] [place], [place] L, RPH-CPP   4 months ago Type 2 diabetes mellitus with other specified complication, with long-term current use of insulin [facility])   [facility] [place], Jeannett Senior L, RPH-CPP   5 months ago Type 2 diabetes mellitus with other specified complication, without long-term current use of insulin [facility])   [facility], Lauris Poag, MD   6 months ago Type 2 diabetes mellitus with other specified complication, with long-term current use of insulin [facility])   [facility] Drucilla Chalet, RPH-CPP       Future Appointments             In 5 months Georganna Skeans, MD [facility]

## 2023-02-03 ENCOUNTER — Ambulatory Visit (INDEPENDENT_AMBULATORY_CARE_PROVIDER_SITE_OTHER): Payer: Medicare HMO

## 2023-02-03 ENCOUNTER — Inpatient Hospital Stay: Admission: RE | Admit: 2023-02-03 | Payer: Medicare HMO | Source: Ambulatory Visit

## 2023-02-03 DIAGNOSIS — Z0000 Encounter for general adult medical examination without abnormal findings: Secondary | ICD-10-CM | POA: Diagnosis not present

## 2023-02-03 NOTE — H&P (View-Only) (Signed)
Subjective:   Amy Garner is a 67 y.o. female who presents for Medicare Annual (Subsequent) preventive examination.  Visit Complete: Virtual  I connected with  Amy Garner on 02/03/23 by a audio enabled telemedicine application and verified that I am speaking with the correct person using two identifiers.  Patient Location: Home  Provider Location: Office/Clinic  I discussed the limitations of evaluation and management by telemedicine. The patient expressed understanding and agreed to proceed.  Patient Medicare AWV questionnaire was completed by the patient on 01/27/2023; I have confirmed that all information answered by patient is correct and no changes since this date.  Vital Signs: Unable to obtain new vitals due to this being a telehealth visit.  Cardiac Risk Factors include: advanced age (>51men, >81 women);dyslipidemia;hypertension     Objective:    Today's Vitals   02/03/23 1626  PainSc: 6    There is no height or weight on file to calculate BMI.     02/03/2023    4:32 PM 01/28/2023   11:55 AM 04/25/2020    9:31 AM  Advanced Directives  Does Patient Have a Medical Advance Directive? Yes No No  Type of Estate agent of [place];Living will    Copy of Healthcare Power of Attorney in Chart? No - copy requested    Would patient like information on creating a medical advance directive?   Yes (MAU/Ambulatory/Procedural Areas - Information given)    Current Medications (verified) Outpatient Encounter Medications as of 02/03/2023  Medication Sig   albuterol (VENTOLIN HFA) 108 (90 Base) MCG/ACT inhaler Inhale 2 puffs into the lungs every 6 (six) hours as needed for wheezing or shortness of breath.   Alcohol Swabs PADS USE TO TAKE BLOOD SUGAR 2 TIMES A DAY   cholecalciferol (VITAMIN D3) 25 MCG (1000 UNIT) tablet Take 1,000 Units by mouth daily.   escitalopram (LEXAPRO) 20 MG tablet Take 1 tablet (20 mg total) by mouth daily.   esomeprazole  (NEXIUM) 40 MG capsule TAKE 1 CAPSULE (40 MG TOTAL) BY MOUTH IN THE MORNING.   FLUoxetine (PROZAC) 20 MG capsule Take 1 capsule (20 mg total) by mouth daily.   glucose blood (TRUE METRIX BLOOD GLUCOSE TEST) test strip Use as instructed   glucose blood test strip Use as instructed   insulin glargine, 1 Unit Dial, (TOUJEO SOLOSTAR) 300 UNIT/ML Solostar Pen Inject 62 Units into the skin daily. (Patient taking differently: Inject 62 Units into the skin daily. qam)   Insulin Pen Needle (PEN NEEDLES) 32G X 4 MM MISC Use to inject insulin once daily.   losartan (COZAAR) 25 MG tablet TAKE 1 TABLET EVERY DAY   lovastatin (MEVACOR) 10 MG tablet TAKE 1 TABLET AT BEDTIME   meclizine (ANTIVERT) 25 MG tablet Take 1 tablet (25 mg total) by mouth 3 (three) times daily as needed for dizziness.   metFORMIN (GLUCOPHAGE-XR) 500 MG 24 hr tablet TAKE 2 TABLETS twice daily with meals.   Misc. Devices MISC TRUE MATRIX METER   Semaglutide, 2 MG/DOSE, (OZEMPIC, 2 MG/DOSE,) 8 MG/3ML SOPN INJECT 2MG  UNDER THE SKIN ONE TIME WEEKLY AS DIRECTED   triamterene-hydrochlorothiazide (DYAZIDE) 37.5-25 MG capsule TAKE 1 CAPSULE EVERY DAY   TRUEplus Lancets 33G MISC USE TO TAKE BLOOD SUGAR 2 TIMES A DAY   doxycycline (VIBRA-TABS) 100 MG tablet Take 1 tablet (100 mg total) by mouth 2 (two) times daily. (Patient not taking: Reported on 01/21/2023)   No facility-administered encounter medications on file as of 02/03/2023.  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   INGUINAL HERNIA REPAIR Left 05/02/2020   Procedure: OPEN LEFT INGUINAL HERNIA REPAIR WITH MESH, TAP BLOCK;  Surgeon: Berna Bue, MD;  Location: [facility];  Service: General;  Laterality: Left;   TUBAL LIGATION  1990s   Family History  Problem Relation Age of Onset   Heart attack Mother    Asthma Mother    Heart disease Father    Hypertension Father    Stomach cancer Paternal Aunt    Colon cancer Paternal Aunt    Varicose Veins Maternal Grandmother    Diabetes Paternal Grandmother    ADD / ADHD Daughter    ADD / ADHD Son    Social History   Socioeconomic History   Marital status: Widowed    Spouse name: Not on file   Number of children: Not on file   Years of education: Not on file   Highest education level: Associate degree: occupational, Scientist, product/process development, or vocational program  Occupational History   Not on file  Tobacco Use   Smoking status: Former    Current packs/day: 0.00    Average packs/day: 1.1 packs/day for 20.0 years (22.5 ttl pk-yrs)    Types: Cigarettes    Start date: 2002    Quit date: 2017    Years since quitting: 7.7   Smokeless tobacco: Never  Vaping Use   Vaping status: Never Used  Substance and Sexual Activity   Alcohol use: Never   Drug use: Never   Sexual activity: Yes    Birth control/protection: Surgical  Other Topics Concern   Not on file  Social History Narrative   Lives alone   Social Determinants of Health   Financial Resource Strain: Low Risk  (01/27/2023)   Overall Financial Resource Strain (CARDIA)    Difficulty of Paying Living Expenses: Not very hard  Food Insecurity: Food Insecurity Present (01/27/2023)   Hunger Vital Sign    Worried About Running Out of Food in the Last Year: Sometimes true     Ran Out of Food in the Last Year: Never true  Transportation Needs: No Transportation Needs (01/27/2023)   PRAPARE - Administrator, Civil Service (Medical): No    Lack of Transportation (Non-Medical): No  Physical Activity: Insufficiently Active (01/27/2023)   Exercise Vital Sign    Days of Exercise per Week: 1 day    Minutes of Exercise per Session: 20 min  Stress: No Stress Concern Present (01/27/2023)   [employer] of Occupational Health - Occupational Stress Questionnaire    Feeling of Stress : Not at all  Social Connections: Socially Isolated (01/27/2023)   Social Connection and Isolation Panel [NHANES]    Frequency of Communication with Friends and Family: Three times a week    Frequency of Social Gatherings with Friends and Family: Twice a week    Attends Religious Services: Never    Database administrator or Organizations: No    Attends Banker Meetings: Never    Marital Status: Widowed    Tobacco Counseling Counseling given: Not Answered   Clinical Intake:  Pre-visit preparation completed: Yes  Pain : 0-10 Pain Score: 6  Pain Type: Chronic pain Pain Location: Foot Pain Orientation: Right Pain Descriptors / Indicators: Aching Pain Onset: More than a month ago Pain Frequency: Constant     Nutritional Risks: None Diabetes: Yes CBG done?: No Did pt. bring in CBG monitor from home?: No  How often do you need to have someone help you when you read instructions, pamphlets, or other written materials from your doctor or pharmacy?: 1 - Never  Interpreter Needed?: No  Information entered by :: NAllen LPN   Activities of Daily Living    01/28/2023   11:56 AM 01/28/2023   11:54 AM  In your present state of health, do you have any difficulty performing the following activities:  Hearing?  0  Vision?  1  Comment  glasses  Difficulty concentrating or making decisions?  0  Walking or climbing stairs?  0  Dressing or bathing?  0  Doing  errands, shopping? 0     Patient Care Team: Georganna Skeans, MD as PCP - General (Family Medicine)  Indicate any recent Medical Services you may have received from other than [facility] providers in the past year (date may be approximate).     Assessment:   This is a routine wellness examination for Ka[place]a.  Hearing/Vision screen Hearing Screening - Comments:: Denies hearing issues Vision Screening - Comments:: Regular eye exams, [facility]   Goals Addressed             This Visit's Progress    Patient Stated       02/03/2023, wants to eat better and lose weight       Depression Screen    02/03/2023    4:33 PM 01/21/2023   11:07 AM 07/23/2022    4:10 PM 07/13/2021    2:14 PM  PHQ 2/9 Scores  PHQ - 2 Score 0 0 0 0  PHQ- 9 Score 0   0    Fall Risk    01/27/2023    2:16 PM 01/21/2023   11:08 AM  Fall Risk   Falls in the past year? 0 0  Number falls in past yr: 0 0  Injury with Fall? 1 0  Risk for fall due to : Medication side effect   Follow up Falls prevention discussed;Falls evaluation completed     MEDICARE RISK AT HOME: Medicare Risk at Home Any stairs in or around the home?: Yes If so, are there any without handrails?: No Home free of loose throw rugs in walkways, pet beds, electrical cords, etc?: Yes Adequate lighting in your home to reduce risk of falls?: Yes Life alert?: No Use of a cane, walker or w/c?: No Grab bars in the bathroom?: No Shower chair or bench in shower?: No Elevated toilet seat or a handicapped toilet?: Yes  TIMED UP AND GO:  Was the test performed?  No    Cognitive Function:        02/03/2023  4:34 PM  6CIT Screen  What Year? 0 points  What month? 0 points  What time? 0 points  Count back from 20 0 points  Months in reverse 0 points  Repeat phrase 0 points  Total Score 0 points    Immunizations Immunization History  Administered Date(s) Administered   Influenza, High Dose Seasonal PF 03/31/2021    Influenza,inj,Quad PF,6-35 Mos 03/01/2022   Influenza-Unspecified 02/15/2015   Pneumococcal Polysaccharide-23 03/31/2021    TDAP status: Up to date  Flu Vaccine status: Due, Education has been provided regarding the importance of this vaccine. Advised may receive this vaccine at local pharmacy or Health Dept. Aware to provide a copy of the vaccination record if obtained from local pharmacy or Health Dept. Verbalized acceptance and understanding.  Pneumococcal vaccine status: Up to date  Covid-19 vaccine status: Information provided on how to obtain vaccines.   Qualifies for Shingles Vaccine? Yes   Zostavax completed No   Shingrix Completed?: No.    Education has been provided regarding the importance of this vaccine. Patient has been advised to call insurance company to determine out of pocket expense if they have not yet received this vaccine. Advised may also receive vaccine at local pharmacy or Health Dept. Verbalized acceptance and understanding.  Screening Tests Health Maintenance  Topic Date Due   Diabetic kidney evaluation - Urine ACR  Never done   Hepatitis C Screening  Never done   Lung Cancer Screening  Never done   INFLUENZA VACCINE  12/16/2022   DEXA SCAN  05/17/2023 (Originally 10/10/2019)   Colonoscopy  05/17/2023 (Originally 10/10/1999)   Diabetic kidney evaluation - eGFR measurement  01/31/2024   Medicare Annual Wellness (AWV)  02/03/2024   MAMMOGRAM  01/26/2025   HPV VACCINES  Aged Out   DTaP/Tdap/Td  Discontinued   Pneumonia Vaccine 60+ Years old  Discontinued   COVID-19 Vaccine  Discontinued   Zoster Vaccines- Shingrix  Discontinued    Health Maintenance  Health Maintenance Due  Topic Date Due   Diabetic kidney evaluation - Urine ACR  Never done   Hepatitis C Screening  Never done   Lung Cancer Screening  Never done   INFLUENZA VACCINE  12/16/2022    Colorectal cancer screening: states getting before January   Mammogram status: Completed 01/27/2023.  Repeat every year  Bone Density status: declines at this time  Lung Cancer Screening: (Low Dose CT Chest recommended if Age 85-80 years, 20 pack-year currently smoking OR have quit w/in 15years.) does not qualify.   Lung Cancer Screening Referral: no  Additional Screening:  Hepatitis C Screening: does qualify;  Vision Screening: Recommended annual ophthalmology exams for early detection of glaucoma and other disorders of the eye. Is the patient up to date with their annual eye exam?  Yes  Who is the provider or what is the name of the office in which the patient attends annual eye exams? [facility] If pt is not established with a provider, would they like to be referred to a provider to establish care? No .   Dental Screening: Recommended annual dental exams for proper oral hygiene  Diabetic Foot Exam: Diabetic Foot Exam: Overdue, Pt has been advised about the importance in completing this exam. Pt is scheduled for diabetic foot exam on next appointment.  Community Resource Referral / Chronic Care Management: CRR required this visit?  No   CCM required this visit?  No     Plan:     I have personally reviewed and  noted the following in the patient's chart:   Medical and social history Use of alcohol, tobacco or illicit drugs  Current medications and supplements including opioid prescriptions. Patient is not currently taking opioid prescriptions. Functional ability and status Nutritional status Physical activity Advanced directives List of other physicians Hospitalizations, surgeries, and ER visits in previous 12 months Vitals Screenings to include cognitive, depression, and falls Referrals and appointments  In addition, I have reviewed and discussed with patient certain preventive protocols, quality metrics, and best practice recommendations. A written personalized care plan for preventive services as well as general preventive health recommendations were provided to  patient.     Barb Merino, LPN   01/13/5620   After Visit Summary: (MyChart) Due to this being a telephonic visit, the after visit summary with patients personalized plan was offered to patient via MyChart   Nurse Notes: none

## 2023-02-03 NOTE — Progress Notes (Signed)
Subjective:   Amy Garner is a 67 y.o. female who presents for Medicare Annual (Subsequent) preventive examination.  Visit Complete: Virtual  I connected with  Amy Garner on 02/03/23 by a audio enabled telemedicine application and verified that I am speaking with the correct person using two identifiers.  Patient Location: Home  Provider Location: Office/Clinic  I discussed the limitations of evaluation and management by telemedicine. The patient expressed understanding and agreed to proceed.  Patient Medicare AWV questionnaire was completed by the patient on 01/27/2023; I have confirmed that all information answered by patient is correct and no changes since this date.  Vital Signs: Unable to obtain new vitals due to this being a telehealth visit.  Cardiac Risk Factors include: advanced age (>18men, >55 women);dyslipidemia;hypertension     Objective:    Today's Vitals   02/03/23 1626  PainSc: 6    There is no height or weight on file to calculate BMI.     02/03/2023    4:32 PM 01/28/2023   11:55 AM 04/25/2020    9:31 AM  Advanced Directives  Does Patient Have a Medical Advance Directive? Yes No No  Type of Estate agent of [place];Living will    Copy of Healthcare Power of Attorney in Chart? No - copy requested    Would patient like information on creating a medical advance directive?   Yes (MAU/Ambulatory/Procedural Areas - Information given)    Current Medications (verified) Outpatient Encounter Medications as of 02/03/2023  Medication Sig   albuterol (VENTOLIN HFA) 108 (90 Base) MCG/ACT inhaler Inhale 2 puffs into the lungs every 6 (six) hours as needed for wheezing or shortness of breath.   Alcohol Swabs PADS USE TO TAKE BLOOD SUGAR 2 TIMES A DAY   cholecalciferol (VITAMIN D3) 25 MCG (1000 UNIT) tablet Take 1,000 Units by mouth daily.   escitalopram (LEXAPRO) 20 MG tablet Take 1 tablet (20 mg total) by mouth daily.   esomeprazole  (NEXIUM) 40 MG capsule TAKE 1 CAPSULE (40 MG TOTAL) BY MOUTH IN THE MORNING.   FLUoxetine (PROZAC) 20 MG capsule Take 1 capsule (20 mg total) by mouth daily.   glucose blood (TRUE METRIX BLOOD GLUCOSE TEST) test strip Use as instructed   glucose blood test strip Use as instructed   insulin glargine, 1 Unit Dial, (TOUJEO SOLOSTAR) 300 UNIT/ML Solostar Pen Inject 62 Units into the skin daily. (Patient taking differently: Inject 62 Units into the skin daily. qam)   Insulin Pen Needle (PEN NEEDLES) 32G X 4 MM MISC Use to inject insulin once daily.   losartan (COZAAR) 25 MG tablet TAKE 1 TABLET EVERY DAY   lovastatin (MEVACOR) 10 MG tablet TAKE 1 TABLET AT BEDTIME   meclizine (ANTIVERT) 25 MG tablet Take 1 tablet (25 mg total) by mouth 3 (three) times daily as needed for dizziness.   metFORMIN (GLUCOPHAGE-XR) 500 MG 24 hr tablet TAKE 2 TABLETS twice daily with meals.   Misc. Devices MISC TRUE MATRIX METER   Semaglutide, 2 MG/DOSE, (OZEMPIC, 2 MG/DOSE,) 8 MG/3ML SOPN INJECT 2MG  UNDER THE SKIN ONE TIME WEEKLY AS DIRECTED   triamterene-hydrochlorothiazide (DYAZIDE) 37.5-25 MG capsule TAKE 1 CAPSULE EVERY DAY   TRUEplus Lancets 33G MISC USE TO TAKE BLOOD SUGAR 2 TIMES A DAY   doxycycline (VIBRA-TABS) 100 MG tablet Take 1 tablet (100 mg total) by mouth 2 (two) times daily. (Patient not taking: Reported on 01/21/2023)   No facility-administered encounter medications on file as of 02/03/2023.  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   INGUINAL HERNIA REPAIR Left 05/02/2020   Procedure: OPEN LEFT INGUINAL HERNIA REPAIR WITH MESH, TAP BLOCK;  Surgeon: Berna Bue, MD;  Location: [facility];  Service: General;  Laterality: Left;   TUBAL LIGATION  1990s   Family History  Problem Relation Age of Onset   Heart attack Mother    Asthma Mother    Heart disease Father    Hypertension Father    Stomach cancer Paternal Aunt    Colon cancer Paternal Aunt    Varicose Veins Maternal Grandmother    Diabetes Paternal Grandmother    ADD / ADHD Daughter    ADD / ADHD Son    Social History   Socioeconomic History   Marital status: Widowed    Spouse name: Not on file   Number of children: Not on file   Years of education: Not on file   Highest education level: Associate degree: occupational, Scientist, product/process development, or vocational program  Occupational History   Not on file  Tobacco Use   Smoking status: Former    Current packs/day: 0.00    Average packs/day: 1.1 packs/day for 20.0 years (22.5 ttl pk-yrs)    Types: Cigarettes    Start date: 2002    Quit date: 2017    Years since quitting: 7.7   Smokeless tobacco: Never  Vaping Use   Vaping status: Never Used  Substance and Sexual Activity   Alcohol use: Never   Drug use: Never   Sexual activity: Yes    Birth control/protection: Surgical  Other Topics Concern   Not on file  Social History Narrative   Lives alone   Social Determinants of Health   Financial Resource Strain: Low Risk  (01/27/2023)   Overall Financial Resource Strain (CARDIA)    Difficulty of Paying Living Expenses: Not very hard  Food Insecurity: Food Insecurity Present (01/27/2023)   Hunger Vital Sign    Worried About Running Out of Food in the Last Year: Sometimes true     Ran Out of Food in the Last Year: Never true  Transportation Needs: No Transportation Needs (01/27/2023)   PRAPARE - Administrator, Civil Service (Medical): No    Lack of Transportation (Non-Medical): No  Physical Activity: Insufficiently Active (01/27/2023)   Exercise Vital Sign    Days of Exercise per Week: 1 day    Minutes of Exercise per Session: 20 min  Stress: No Stress Concern Present (01/27/2023)   [employer] of Occupational Health - Occupational Stress Questionnaire    Feeling of Stress : Not at all  Social Connections: Socially Isolated (01/27/2023)   Social Connection and Isolation Panel [NHANES]    Frequency of Communication with Friends and Family: Three times a week    Frequency of Social Gatherings with Friends and Family: Twice a week    Attends Religious Services: Never    Database administrator or Organizations: No    Attends Banker Meetings: Never    Marital Status: Widowed    Tobacco Counseling Counseling given: Not Answered   Clinical Intake:  Pre-visit preparation completed: Yes  Pain : 0-10 Pain Score: 6  Pain Type: Chronic pain Pain Location: Foot Pain Orientation: Right Pain Descriptors / Indicators: Aching Pain Onset: More than a month ago Pain Frequency: Constant     Nutritional Risks: None Diabetes: Yes CBG done?: No Did pt. bring in CBG monitor from home?: No  How often do you need to have someone help you when you read instructions, pamphlets, or other written materials from your doctor or pharmacy?: 1 - Never  Interpreter Needed?: No  Information entered by :: NAllen LPN   Activities of Daily Living    01/28/2023   11:56 AM 01/28/2023   11:54 AM  In your present state of health, do you have any difficulty performing the following activities:  Hearing?  0  Vision?  1  Comment  glasses  Difficulty concentrating or making decisions?  0  Walking or climbing stairs?  0  Dressing or bathing?  0  Doing  errands, shopping? 0     Patient Care Team: Georganna Skeans, MD as PCP - General (Family Medicine)  Indicate any recent Medical Services you may have received from other than [facility] providers in the past year (date may be approximate).     Assessment:   This is a routine wellness examination for [place].  Hearing/Vision screen Hearing Screening - Comments:: Denies hearing issues Vision Screening - Comments:: Regular eye exams, [facility]   Goals Addressed             This Visit's Progress    Patient Stated       02/03/2023, wants to eat better and lose weight       Depression Screen    02/03/2023    4:33 PM 01/21/2023   11:07 AM 07/23/2022    4:10 PM 07/13/2021    2:14 PM  PHQ 2/9 Scores  PHQ - 2 Score 0 0 0 0  PHQ- 9 Score 0   0    Fall Risk    01/27/2023    2:16 PM 01/21/2023   11:08 AM  Fall Risk   Falls in the past year? 0 0  Number falls in past yr: 0 0  Injury with Fall? 1 0  Risk for fall due to : Medication side effect   Follow up Falls prevention discussed;Falls evaluation completed     MEDICARE RISK AT HOME: Medicare Risk at Home Any stairs in or around the home?: Yes If so, are there any without handrails?: No Home free of loose throw rugs in walkways, pet beds, electrical cords, etc?: Yes Adequate lighting in your home to reduce risk of falls?: Yes Life alert?: No Use of a cane, walker or w/c?: No Grab bars in the bathroom?: No Shower chair or bench in shower?: No Elevated toilet seat or a handicapped toilet?: Yes  TIMED UP AND GO:  Was the test performed?  No    Cognitive Function:        02/03/2023  4:34 PM  6CIT Screen  What Year? 0 points  What month? 0 points  What time? 0 points  Count back from 20 0 points  Months in reverse 0 points  Repeat phrase 0 points  Total Score 0 points    Immunizations Immunization History  Administered Date(s) Administered   Influenza, High Dose Seasonal PF 03/31/2021    Influenza,inj,Quad PF,6-35 Mos 03/01/2022   Influenza-Unspecified 02/15/2015   Pneumococcal Polysaccharide-23 03/31/2021    TDAP status: Up to date  Flu Vaccine status: Due, Education has been provided regarding the importance of this vaccine. Advised may receive this vaccine at local pharmacy or Health Dept. Aware to provide a copy of the vaccination record if obtained from local pharmacy or Health Dept. Verbalized acceptance and understanding.  Pneumococcal vaccine status: Up to date  Covid-19 vaccine status: Information provided on how to obtain vaccines.   Qualifies for Shingles Vaccine? Yes   Zostavax completed No   Shingrix Completed?: No.    Education has been provided regarding the importance of this vaccine. Patient has been advised to call insurance company to determine out of pocket expense if they have not yet received this vaccine. Advised may also receive vaccine at local pharmacy or Health Dept. Verbalized acceptance and understanding.  Screening Tests Health Maintenance  Topic Date Due   Diabetic kidney evaluation - Urine ACR  Never done   Hepatitis C Screening  Never done   Lung Cancer Screening  Never done   INFLUENZA VACCINE  12/16/2022   DEXA SCAN  05/17/2023 (Originally 10/10/2019)   Colonoscopy  05/17/2023 (Originally 10/10/1999)   Diabetic kidney evaluation - eGFR measurement  01/31/2024   Medicare Annual Wellness (AWV)  02/03/2024   MAMMOGRAM  01/26/2025   HPV VACCINES  Aged Out   DTaP/Tdap/Td  Discontinued   Pneumonia Vaccine 82+ Years old  Discontinued   COVID-19 Vaccine  Discontinued   Zoster Vaccines- Shingrix  Discontinued    Health Maintenance  Health Maintenance Due  Topic Date Due   Diabetic kidney evaluation - Urine ACR  Never done   Hepatitis C Screening  Never done   Lung Cancer Screening  Never done   INFLUENZA VACCINE  12/16/2022    Colorectal cancer screening: states getting before January   Mammogram status: Completed 01/27/2023.  Repeat every year  Bone Density status: declines at this time  Lung Cancer Screening: (Low Dose CT Chest recommended if Age 81-80 years, 20 pack-year currently smoking OR have quit w/in 15years.) does not qualify.   Lung Cancer Screening Referral: no  Additional Screening:  Hepatitis C Screening: does qualify;  Vision Screening: Recommended annual ophthalmology exams for early detection of glaucoma and other disorders of the eye. Is the patient up to date with their annual eye exam?  Yes  Who is the provider or what is the name of the office in which the patient attends annual eye exams? [facility] If pt is not established with a provider, would they like to be referred to a provider to establish care? No .   Dental Screening: Recommended annual dental exams for proper oral hygiene  Diabetic Foot Exam: Diabetic Foot Exam: Overdue, Pt has been advised about the importance in completing this exam. Pt is scheduled for diabetic foot exam on next appointment.  Community Resource Referral / Chronic Care Management: CRR required this visit?  No   CCM required this visit?  No     Plan:     I have personally reviewed and  noted the following in the patient's chart:   Medical and social history Use of alcohol, tobacco or illicit drugs  Current medications and supplements including opioid prescriptions. Patient is not currently taking opioid prescriptions. Functional ability and status Nutritional status Physical activity Advanced directives List of other physicians Hospitalizations, surgeries, and ER visits in previous 12 months Vitals Screenings to include cognitive, depression, and falls Referrals and appointments  In addition, I have reviewed and discussed with patient certain preventive protocols, quality metrics, and best practice recommendations. A written personalized care plan for preventive services as well as general preventive health recommendations were provided to  patient.     Barb Merino, LPN   [id number]/84/[id number]   After Visit Summary: (MyChart) Due to this being a telephonic visit, the after visit summary with patients personalized plan was offered to patient via MyChart   Nurse Notes: none

## 2023-02-03 NOTE — Patient Instructions (Signed)
Ms. Trana , Thank you for taking time to come for your Medicare Wellness Visit. I appreciate your ongoing commitment to your health goals. Please review the following plan we discussed and let me know if I can assist you in the future.   Referrals/Orders/Follow-Ups/Clinician Recommendations: none  This is a list of the screening recommended for you and due dates:  Health Maintenance  Topic Date Due   Yearly kidney health urinalysis for diabetes  Never done   Hepatitis C Screening  Never done   Screening for Lung Cancer  Never done   Flu Shot  12/16/2022   DEXA scan (bone density measurement)  05/17/2023*   Colon Cancer Screening  05/17/2023*   Yearly kidney function blood test for diabetes  01/31/2024   Medicare Annual Wellness Visit  02/03/2024   Mammogram  01/26/2025   HPV Vaccine  Aged Out   DTaP/Tdap/Td vaccine  Discontinued   Pneumonia Vaccine  Discontinued   COVID-19 Vaccine  Discontinued   Zoster (Shingles) Vaccine  Discontinued  *Topic was postponed. The date shown is not the original due date.    Advanced directives: (Copy Requested) Please bring a copy of your health care power of attorney and living will to the office to be added to your chart at your convenience.  Next Medicare Annual Wellness Visit scheduled for next year: No, schedule is not open for next year  Insert Preventive Care attachment Insert FALL PREVENTION attachment if needed

## 2023-02-07 ENCOUNTER — Other Ambulatory Visit: Payer: Self-pay | Admitting: Podiatry

## 2023-02-07 ENCOUNTER — Ambulatory Visit: Payer: Medicare HMO

## 2023-02-07 ENCOUNTER — Ambulatory Visit: Payer: Medicare HMO | Admitting: Anesthesiology

## 2023-02-07 ENCOUNTER — Encounter
Admission: RE | Disposition: A | Discharge status: Home / Self Care | Payer: Self-pay | Source: Home / Self Care | Attending: Podiatry

## 2023-02-07 ENCOUNTER — Ambulatory Visit
Admission: RE | Admit: 2023-02-07 | Discharge: 2023-02-07 | Disposition: A | Discharge status: Home / Self Care | Payer: Medicare HMO | Attending: Podiatry | Admitting: Podiatry

## 2023-02-07 ENCOUNTER — Ambulatory Visit: Payer: Medicare HMO | Admitting: Urgent Care

## 2023-02-07 ENCOUNTER — Encounter: Payer: Self-pay | Admitting: Podiatry

## 2023-02-07 ENCOUNTER — Other Ambulatory Visit: Payer: Self-pay

## 2023-02-07 DIAGNOSIS — M2041 Other hammer toe(s) (acquired), right foot: Secondary | ICD-10-CM | POA: Insufficient documentation

## 2023-02-07 DIAGNOSIS — M21611 Bunion of right foot: Secondary | ICD-10-CM | POA: Insufficient documentation

## 2023-02-07 DIAGNOSIS — E119 Type 2 diabetes mellitus without complications: Secondary | ICD-10-CM | POA: Diagnosis not present

## 2023-02-07 DIAGNOSIS — Z794 Long term (current) use of insulin: Secondary | ICD-10-CM | POA: Insufficient documentation

## 2023-02-07 DIAGNOSIS — I10 Essential (primary) hypertension: Secondary | ICD-10-CM | POA: Diagnosis not present

## 2023-02-07 DIAGNOSIS — Z87891 Personal history of nicotine dependence: Secondary | ICD-10-CM | POA: Insufficient documentation

## 2023-02-07 DIAGNOSIS — G894 Chronic pain syndrome: Secondary | ICD-10-CM | POA: Insufficient documentation

## 2023-02-07 DIAGNOSIS — F419 Anxiety disorder, unspecified: Secondary | ICD-10-CM | POA: Insufficient documentation

## 2023-02-07 DIAGNOSIS — K219 Gastro-esophageal reflux disease without esophagitis: Secondary | ICD-10-CM | POA: Insufficient documentation

## 2023-02-07 DIAGNOSIS — F32A Depression, unspecified: Secondary | ICD-10-CM | POA: Insufficient documentation

## 2023-02-07 DIAGNOSIS — Z01812 Encounter for preprocedural laboratory examination: Secondary | ICD-10-CM

## 2023-02-07 DIAGNOSIS — M7751 Other enthesopathy of right foot: Secondary | ICD-10-CM | POA: Diagnosis not present

## 2023-02-07 DIAGNOSIS — E785 Hyperlipidemia, unspecified: Secondary | ICD-10-CM | POA: Diagnosis not present

## 2023-02-07 DIAGNOSIS — Z9889 Other specified postprocedural states: Secondary | ICD-10-CM | POA: Diagnosis not present

## 2023-02-07 DIAGNOSIS — J45909 Unspecified asthma, uncomplicated: Secondary | ICD-10-CM | POA: Diagnosis not present

## 2023-02-07 DIAGNOSIS — M19071 Primary osteoarthritis, right ankle and foot: Secondary | ICD-10-CM | POA: Insufficient documentation

## 2023-02-07 HISTORY — PX: HAMMER TOE SURGERY: SHX385

## 2023-02-07 HISTORY — PX: HALLUX FUSION: SHX6621

## 2023-02-07 HISTORY — PX: CAPSULOTOMY METATARSOPHALANGEAL: SHX6614

## 2023-02-07 LAB — GLUCOSE, CAPILLARY
Glucose-Capillary: 153 mg/dL — ABNORMAL HIGH (ref 70–99)
Glucose-Capillary: 145 mg/dL — ABNORMAL HIGH (ref 70–99)

## 2023-02-07 SURGERY — FUSION, JOINT, GREAT TOE
Anesthesia: General | Site: Toe | Laterality: Right

## 2023-02-07 MED ORDER — PROMETHAZINE HCL 25 MG/ML IJ SOLN: 6.2500 mg | INTRAMUSCULAR | Status: DC | PRN

## 2023-02-07 MED ORDER — BUPIVACAINE HCL (PF) 0.5 % IJ SOLN
INTRAMUSCULAR | Status: DC | PRN
  Administered 2023-02-07: 10 mL via PERINEURAL

## 2023-02-07 MED ORDER — FENTANYL CITRATE (PF) 100 MCG/2ML IJ SOLN
INTRAMUSCULAR | Status: AC
  Filled 2023-02-07: qty 2

## 2023-02-07 MED ORDER — FENTANYL CITRATE (PF) 100 MCG/2ML IJ SOLN
INTRAMUSCULAR | Status: DC | PRN
  Administered 2023-02-07 (×2): 25 ug via INTRAVENOUS

## 2023-02-07 MED ORDER — MIDAZOLAM HCL 2 MG/2ML IJ SOLN
INTRAMUSCULAR | Status: AC
  Filled 2023-02-07: qty 2

## 2023-02-07 MED ORDER — OXYCODONE HCL 5 MG/5ML PO SOLN: 5.0000 mg | Freq: Once | ORAL | Status: DC | PRN

## 2023-02-07 MED ORDER — FENTANYL CITRATE (PF) 100 MCG/2ML IJ SOLN: 25.0000 ug | INTRAMUSCULAR | Status: DC | PRN

## 2023-02-07 MED ORDER — LIDOCAINE HCL (PF) 1 % IJ SOLN
INTRAMUSCULAR | Status: AC
  Filled 2023-02-07: qty 2

## 2023-02-07 MED ORDER — CEFAZOLIN SODIUM-DEXTROSE 2-4 GM/100ML-% IV SOLN
2.0000 g | Freq: Once | INTRAVENOUS | Status: AC
  Administered 2023-02-07: 2 g via INTRAVENOUS
  Filled 2023-02-07: qty 100

## 2023-02-07 MED ORDER — CHLORHEXIDINE GLUCONATE 0.12 % MT SOLN
15.0000 mL | Freq: Once | OROMUCOSAL | Status: AC
  Administered 2023-02-07: 15 mL via OROMUCOSAL

## 2023-02-07 MED ORDER — ORAL CARE MOUTH RINSE: 15.0000 mL | Freq: Once | OROMUCOSAL | Status: AC

## 2023-02-07 MED ORDER — LIDOCAINE HCL (CARDIAC) PF 100 MG/5ML IV SOSY
PREFILLED_SYRINGE | INTRAVENOUS | Status: DC | PRN
  Administered 2023-02-07: 80 mg via INTRAVENOUS

## 2023-02-07 MED ORDER — ACETAMINOPHEN 10 MG/ML IV SOLN
INTRAVENOUS | Status: AC
  Filled 2023-02-07: qty 100

## 2023-02-07 MED ORDER — DIPHENHYDRAMINE HCL 50 MG/ML IJ SOLN
INTRAMUSCULAR | Status: DC | PRN
Start: 2023-02-07 — End: 2023-02-07
  Administered 2023-02-07: 12.5 mg via INTRAVENOUS

## 2023-02-07 MED ORDER — LIDOCAINE HCL (PF) 1 % IJ SOLN
INTRAMUSCULAR | Status: DC | PRN
Start: 2023-02-07 — End: 2023-02-07
  Administered 2023-02-07: 1 mL

## 2023-02-07 MED ORDER — BUPIVACAINE LIPOSOME 1.3 % IJ SUSP
10.0000 mL | Freq: Once | INTRAMUSCULAR | Status: AC
  Administered 2023-02-07: 10 mL
  Filled 2023-02-07: qty 20

## 2023-02-07 MED ORDER — 0.9 % SODIUM CHLORIDE (POUR BTL) OPTIME
TOPICAL | Status: DC | PRN
  Administered 2023-02-07: 500 mL

## 2023-02-07 MED ORDER — IBUPROFEN 800 MG PO TABS: 800.0000 mg | ORAL_TABLET | Freq: Four times a day (QID) | ORAL | 1 refills | Status: DC | PRN

## 2023-02-07 MED ORDER — PROPOFOL 10 MG/ML IV BOLUS
INTRAVENOUS | Status: DC | PRN
  Administered 2023-02-07: 50 mg via INTRAVENOUS
  Administered 2023-02-07: 150 mg via INTRAVENOUS

## 2023-02-07 MED ORDER — EPHEDRINE SULFATE-NACL 50-0.9 MG/10ML-% IV SOSY
PREFILLED_SYRINGE | INTRAVENOUS | Status: DC | PRN
Start: 2023-02-07 — End: 2023-02-07
  Administered 2023-02-07: 7.5 mg via INTRAVENOUS
  Administered 2023-02-07: 10 mg via INTRAVENOUS
  Administered 2023-02-07: 7.5 mg via INTRAVENOUS

## 2023-02-07 MED ORDER — ONDANSETRON HCL 4 MG/2ML IJ SOLN
INTRAMUSCULAR | Status: AC
  Filled 2023-02-07: qty 2

## 2023-02-07 MED ORDER — OXYCODONE HCL 5 MG PO TABS: 5.0000 mg | ORAL_TABLET | Freq: Once | ORAL | Status: DC | PRN

## 2023-02-07 MED ORDER — LIDOCAINE HCL (PF) 2 % IJ SOLN
INTRAMUSCULAR | Status: AC
  Filled 2023-02-07: qty 5

## 2023-02-07 MED ORDER — BUPIVACAINE HCL (PF) 0.5 % IJ SOLN
INTRAMUSCULAR | Status: AC
  Filled 2023-02-07: qty 10

## 2023-02-07 MED ORDER — EPHEDRINE 5 MG/ML INJ
INTRAVENOUS | Status: AC
  Filled 2023-02-07: qty 5

## 2023-02-07 MED ORDER — ACETAMINOPHEN 10 MG/ML IV SOLN: 1000.0000 mg | Freq: Once | INTRAVENOUS | Status: DC | PRN

## 2023-02-07 MED ORDER — SODIUM CHLORIDE 0.9 % IV SOLN: INTRAVENOUS | Status: DC

## 2023-02-07 MED ORDER — ONDANSETRON HCL 4 MG/2ML IJ SOLN
INTRAMUSCULAR | Status: DC | PRN
  Administered 2023-02-07: 4 mg via INTRAVENOUS

## 2023-02-07 MED ORDER — PROPOFOL 10 MG/ML IV BOLUS
INTRAVENOUS | Status: AC
  Filled 2023-02-07: qty 20

## 2023-02-07 MED ORDER — DROPERIDOL 2.5 MG/ML IJ SOLN: 0.6250 mg | Freq: Once | INTRAMUSCULAR | Status: DC | PRN

## 2023-02-07 MED ORDER — MIDAZOLAM HCL 2 MG/2ML IJ SOLN
INTRAMUSCULAR | Status: DC | PRN
Start: 2023-02-07 — End: 2023-02-07
  Administered 2023-02-07: 2 mg via INTRAVENOUS

## 2023-02-07 MED ORDER — ACETAMINOPHEN 10 MG/ML IV SOLN
INTRAVENOUS | Status: DC | PRN
  Administered 2023-02-07: 1000 mg via INTRAVENOUS

## 2023-02-07 MED ORDER — OXYCODONE-ACETAMINOPHEN 5-325 MG PO TABS: 1.0000 | ORAL_TABLET | ORAL | 0 refills | Status: DC | PRN

## 2023-02-07 MED ORDER — DIPHENHYDRAMINE HCL 50 MG/ML IJ SOLN
INTRAMUSCULAR | Status: AC
  Filled 2023-02-07: qty 1

## 2023-02-07 MED ORDER — CHLORHEXIDINE GLUCONATE 0.12 % MT SOLN
OROMUCOSAL | Status: AC
  Filled 2023-02-07: qty 15

## 2023-02-07 SURGICAL SUPPLY — 104 items
APL PRP STRL LF DISP 70% ISPRP (MISCELLANEOUS)
BIT DRILL LEOS 2.4 (BIT) IMPLANT
BIT DRILL LEOS SN 2.0 (DRILL) IMPLANT
BIT DRILL MINI AT AC (DRILL) IMPLANT
BLADE AVERAGE 25X9 (BLADE) ×3 IMPLANT
BLADE MED AGGRESSIVE (BLADE) IMPLANT
BLADE OSC/SAG .038X5.5 CUT EDG (BLADE) ×3 IMPLANT
BLADE OSCILLATING/SAGITTAL (BLADE)
BLADE SAW SAG 25X90X1.19 (BLADE) IMPLANT
BLADE SURG 15 STRL LF DISP TIS (BLADE) ×6 IMPLANT
BLADE SURG 15 STRL SS (BLADE) ×4
BLADE SW THK.38XMED LNG THN (BLADE) IMPLANT
BNDG CMPR 5X3 KNIT ELC UNQ LF (GAUZE/BANDAGES/DRESSINGS) ×2
BNDG CMPR 5X4 KNIT ELC UNQ LF (GAUZE/BANDAGES/DRESSINGS) ×2
BNDG CMPR 75X21 PLY HI ABS (MISCELLANEOUS) ×2
BNDG CMPR STD VLCR NS LF 5.8X4 (GAUZE/BANDAGES/DRESSINGS) ×2
BNDG CMPR STD VLCR NS LF 5.8X6 (GAUZE/BANDAGES/DRESSINGS) ×2
BNDG ELASTIC 3INX 5YD STR LF (GAUZE/BANDAGES/DRESSINGS) ×3 IMPLANT
BNDG ELASTIC 4INX 5YD STR LF (GAUZE/BANDAGES/DRESSINGS) ×3 IMPLANT
BNDG ELASTIC 4X5.8 VLCR NS LF (GAUZE/BANDAGES/DRESSINGS) ×3 IMPLANT
BNDG ELASTIC 6X5.8 VLCR NS LF (GAUZE/BANDAGES/DRESSINGS) ×3 IMPLANT
BNDG ESMARCH 4 X 12 STRL LF (GAUZE/BANDAGES/DRESSINGS) ×2
BNDG ESMARCH 4X12 STRL LF (GAUZE/BANDAGES/DRESSINGS) ×3 IMPLANT
BNDG GAUZE DERMACEA FLUFF 4 (GAUZE/BANDAGES/DRESSINGS) ×3 IMPLANT
BNDG GZE DERMACEA 4 6PLY (GAUZE/BANDAGES/DRESSINGS) ×2
BOOT STEPPER DURA MED (SOFTGOODS) IMPLANT
CHLORAPREP W/TINT 26 (MISCELLANEOUS) ×3 IMPLANT
COVER LIGHT HANDLE STERIS (MISCELLANEOUS) ×6 IMPLANT
COVER PIN YLW 0.028-062 (MISCELLANEOUS) IMPLANT
CUFF TOURN SGL QUICK 18X4 (TOURNIQUET CUFF) ×3 IMPLANT
DRAPE EXTREMITY 106X87X128.5 (DRAPES) ×3 IMPLANT
DRAPE FLUOR MINI C-ARM 54X84 (DRAPES) IMPLANT
DRAPE IMP U-DRAPE 54X76 (DRAPES) ×3 IMPLANT
DRAPE OEC MINIVIEW 54X84 (DRAPES) ×3 IMPLANT
DRAPE U-SHAPE 47X51 STRL (DRAPES) ×3 IMPLANT
DRILL LEOS SN 2.0 (DRILL) ×2
DRILL MINI AT AC (DRILL) ×2
DURAPREP 26ML APPLICATOR (WOUND CARE) ×3 IMPLANT
ELECT REM PT RETURN 9FT ADLT (ELECTROSURGICAL) ×2
ELECTRODE REM PT RTRN 9FT ADLT (ELECTROSURGICAL) ×3 IMPLANT
GAUZE 4X4 16PLY ~~LOC~~+RFID DBL (SPONGE) ×3 IMPLANT
GAUZE SPONGE 4X4 12PLY STRL (GAUZE/BANDAGES/DRESSINGS) ×3 IMPLANT
GAUZE STRETCH 2X75IN STRL (MISCELLANEOUS) ×3 IMPLANT
GAUZE XEROFORM 1X8 LF (GAUZE/BANDAGES/DRESSINGS) ×3 IMPLANT
GLOVE BIO SURGEON STRL SZ7.5 (GLOVE) ×3 IMPLANT
GLOVE SURG SYN 7.0 (GLOVE) ×2
GLOVE SURG SYN 7.0 PF PI (GLOVE) ×3 IMPLANT
GOWN STRL REUS W/ TWL LRG LVL3 (GOWN DISPOSABLE) ×6 IMPLANT
GOWN STRL REUS W/TWL LRG LVL3 (GOWN DISPOSABLE) ×4
KIT TURNOVER CYSTO (KITS) ×3 IMPLANT
KIT TURNOVER KIT A (KITS) ×3 IMPLANT
KWIRE LEOS SMOOTH 1.6 (WIRE) IMPLANT
MANIFOLD NEPTUNE II (INSTRUMENTS) ×3 IMPLANT
NDL HYPO 25X1 1.5 SAFETY (NEEDLE) IMPLANT
NEEDLE HYPO 25X1 1.5 SAFETY (NEEDLE)
NS IRRIG 1000ML POUR BTL (IV SOLUTION) IMPLANT
NS IRRIG 500ML POUR BTL (IV SOLUTION) ×3 IMPLANT
PACK EXTREMITY ARMC (MISCELLANEOUS) ×3 IMPLANT
PAD CAST 4YDX4 CTTN HI CHSV (CAST SUPPLIES) ×3 IMPLANT
PAD PREP OB/GYN DISP 24X41 (PERSONAL CARE ITEMS) ×3 IMPLANT
PADDING CAST BLEND 4X4 NS (MISCELLANEOUS) ×12 IMPLANT
PADDING CAST COTTON 4X4 STRL (CAST SUPPLIES)
PENCIL SMOKE EVACUATOR (MISCELLANEOUS) ×3 IMPLANT
PLATE 0D MTP RT SM (Plate) IMPLANT
PLATE TACK LEOS 1.6X10 (Plate) IMPLANT
PLATE TACK LEOS 20 (WIRE) IMPLANT
SCREW LEOS LOCK 3.5X16 (Screw) IMPLANT
SCREW LOCK 2.7X10 (Screw) IMPLANT
SCREW LOCK 2.7X12 (Screw) IMPLANT
SCREW MINI AT3 X 22MM (Screw) IMPLANT
SCREW NLOCK 2.7X12 (Screw) ×2 IMPLANT
SCREW NLOCK LEOS 3.5X18 (Screw) IMPLANT
SCREW NONLOCK 2.7X12 (Screw) IMPLANT
SPLINT CAST 1 STEP 4X30 (MISCELLANEOUS) ×3 IMPLANT
STOCKINETTE IMPERVIOUS 9X36 MD (GAUZE/BANDAGES/DRESSINGS) ×3 IMPLANT
STOCKINETTE STRL 6IN 960660 (GAUZE/BANDAGES/DRESSINGS) ×3 IMPLANT
SUCTION TUBE FRAZIER 10FR DISP (SUCTIONS) ×3 IMPLANT
SUT ETHILON 3 0 PS 1 (SUTURE) ×6 IMPLANT
SUT ETHILON 4-0 (SUTURE)
SUT ETHILON 4-0 FS2 18XMFL BLK (SUTURE)
SUT MNCRL 4-0 (SUTURE)
SUT MNCRL 4-018XMFL (SUTURE)
SUT MNCRL AB 3-0 PS2 27 (SUTURE) ×3 IMPLANT
SUT MNCRL+ 5-0 UNDYED PC-3 (SUTURE) IMPLANT
SUT MON AB 3-0 SH 27 (SUTURE) IMPLANT
SUT MON AB 5-0 P3 18 (SUTURE) IMPLANT
SUT PROLENE 3 0 FS 2 (SUTURE) IMPLANT
SUT VIC AB 0 CT1 27 (SUTURE)
SUT VIC AB 0 CT1 27XCR 8 STRN (SUTURE) IMPLANT
SUT VIC AB 2-0 SH 27 (SUTURE)
SUT VIC AB 2-0 SH 27XBRD (SUTURE) IMPLANT
SUT VIC AB 3-0 SH 27 (SUTURE)
SUT VIC AB 3-0 SH 27X BRD (SUTURE) IMPLANT
SUT VIC AB 4-0 FS2 27 (SUTURE) ×3 IMPLANT
SUTURE ETHLN 4-0 FS2 18XMF BLK (SUTURE) IMPLANT
SUTURE MNCRL 4-018XMF (SUTURE) IMPLANT
SYR BULB EAR ULCER 3OZ GRN STR (SYRINGE) ×3 IMPLANT
TACK PLATE LEOS 20 (WIRE) ×4
TOWEL OR 17X26 4PK STRL BLUE (TOWEL DISPOSABLE) ×3 IMPLANT
TRAP FLUID SMOKE EVACUATOR (MISCELLANEOUS) ×3 IMPLANT
TUBING CONNECTOR 18X5MM (MISCELLANEOUS) ×3 IMPLANT
WATER STERILE IRR 500ML POUR (IV SOLUTION) ×3 IMPLANT
WIRE GUIDE SINGLE TROC 1.1X150 (WIRE) IMPLANT
WIRE Z .062 C-WIRE SPADE TIP (WIRE) IMPLANT

## 2023-02-07 NOTE — Anesthesia Procedure Notes (Signed)
Procedure Name: LMA Insertion Date/Time: 02/07/2023 8:03 AM  Performed by: Ines Rebel, [place], CRNAPre-anesthesia Checklist: Patient identified, Patient being monitored, Timeout performed, Emergency Drugs available and Suction available Patient Re-evaluated:Patient Re-evaluated prior to induction Oxygen Delivery Method: Circle system utilized Preoxygenation: Pre-oxygenation with 100% oxygen Induction Type: IV induction Ventilation: Mask ventilation without difficulty LMA: LMA inserted LMA Size: 4.0 Tube type: Oral Number of attempts: 1 Placement Confirmation: positive ETCO2 and breath sounds checked- equal and bilateral Tube secured with: Tape Dental Injury: Teeth and Oropharynx as per pre-operative assessment

## 2023-02-07 NOTE — Anesthesia Postprocedure Evaluation (Signed)
Anesthesia Post Note  Patient: Amy Garner  Procedure(s) Performed: HALLUX FUSION METATARSAL PHALANGEAL JOINT (Right: Foot) HAMMER TOE CORRECTION SECOND AND THIRD (Right: Toe) CAPSULOTOMY METATARSOPHALANGEAL SECOND AND THIRD (Right: Toe)  Patient location during evaluation: PACU Anesthesia Type: General Level of consciousness: awake and alert Pain management: pain level controlled Vital Signs Assessment: post-procedure vital signs reviewed and stable Respiratory status: spontaneous breathing, nonlabored ventilation, respiratory function stable and patient connected to nasal cannula oxygen Cardiovascular status: blood pressure returned to baseline and stable Postop Assessment: no apparent nausea or vomiting Anesthetic complications: no  No notable events documented.   Last Vitals:  Vitals:   02/07/23 1000 02/07/23 1015  BP: 112/60 (!) 115/55  Pulse: 64 65  Resp: 12 14  Temp:  36.7 C  SpO2: 100% 96%    Last Pain:  Vitals:   02/07/23 1015  TempSrc:   PainSc: 0-No pain                 Stephanie Coup

## 2023-02-07 NOTE — Interval H&P Note (Signed)
History and Physical Interval Note:  02/07/2023 7:45 AM  Amy Garner  has presented today for surgery, with the diagnosis of bunion hammertoe arthritis.  The various methods of treatment have been discussed with the patient and family. After consideration of risks, benefits and other options for treatment, the patient has consented to  Procedure(s) with comments: HALLUX FUSION METATARSAL PHALANGEAL JOINT (Right) - POPLITEAL BLOCK HAMMER TOE CORRECTION SECOND AND THIRD (Right) CAPSULOTOMY METATARSOPHALANGEAL SECOND AND THIRD (Right) as a surgical intervention.  The patient's history has been reviewed, patient examined, no change in status, stable for surgery.  I have reviewed the patient's chart and labs.  Questions were answered to the patient's satisfaction.     Candelaria Stagers

## 2023-02-07 NOTE — Transfer of Care (Signed)
Immediate Anesthesia Transfer of Care Note  Patient: Amy Garner  Procedure(s) Performed: HALLUX FUSION METATARSAL PHALANGEAL JOINT (Right: Foot) HAMMER TOE CORRECTION SECOND AND THIRD (Right: Toe) CAPSULOTOMY METATARSOPHALANGEAL SECOND AND THIRD (Right: Toe)  Patient Location: PACU  Anesthesia Type:General and Regional  Level of Consciousness: drowsy  Airway & Oxygen Therapy: Patient Spontanous Breathing and Patient connected to face mask oxygen  Post-op Assessment: Report given to RN and Post -op Vital signs reviewed and stable  Post vital signs: Reviewed and stable  Last Vitals:  Vitals Value Taken Time  BP 112/60 02/07/23 1000  Temp 36.1 C 02/07/23 0944  Pulse 62 02/07/23 1003  Resp 13 02/07/23 1003  SpO2 97 % 02/07/23 1003  Vitals shown include unfiled device data.  Last Pain:  Vitals:   02/07/23 0944  TempSrc:   PainSc: Asleep         Complications: No notable events documented.

## 2023-02-07 NOTE — Anesthesia Preprocedure Evaluation (Addendum)
Anesthesia Evaluation  Patient identified by MRN, date of birth, ID band Patient awake    Reviewed: Allergy & Precautions, H&P , NPO status , Patient's Chart, lab work & pertinent test results  Airway Mallampati: II  TM Distance: >3 FB Neck ROM: full    Dental  (+) Edentulous Lower, Edentulous Upper   Pulmonary asthma , former smoker   Pulmonary exam normal        Cardiovascular hypertension, Pt. on medications Normal cardiovascular exam     Neuro/Psych  PSYCHIATRIC DISORDERS Anxiety Depression    Chronic pain syndrome S/p Neck fusion    GI/Hepatic Neg liver ROS,GERD  Medicated and Controlled,,  Endo/Other  diabetes, Well Controlled, Type 2, Insulin Dependent    Renal/GU      Musculoskeletal  (+) Arthritis ,    Abdominal   Peds  Hematology negative hematology ROS (+)   Anesthesia Other Findings Past Medical History: Codiene & Atenolal: Allergy No date: Anxiety No date: Asthma     Comment:  When ill No date: Bunion, right foot No date: Cervical radiculopathy No date: Chronic pain syndrome No date: Depression No date: GERD (gastroesophageal reflux disease) No date: Hyperlipidemia No date: Hypertension No date: Osteoarthritis of first metatarsophalangeal (MTP) joint due  to inflammatory arthritis No date: Peptic ulcer No date: Pneumonia No date: Type 2 diabetes mellitus (HCC)  Past Surgical History: 1990: APPENDECTOMY No date: BREAST BIOPSY No date: BUNIONECTOMY; Right No date: CERVICAL DISC SURGERY No date: CHOLECYSTECTOMY No date: COLONOSCOPY 1980: DIAGNOSTIC LAPAROSCOPY     Comment:  endometriosis Inguinal right & left repair.: HERNIA REPAIR 05/02/2020: INGUINAL HERNIA REPAIR; Left     Comment:  Procedure: OPEN LEFT INGUINAL HERNIA REPAIR WITH MESH,               TAP BLOCK;  Surgeon: Berna Bue, MD;  Location: [facility]              [facility];  Service: General;  Laterality: Left; 1990s: TUBAL  LIGATION  BMI    Body Mass Index: 36.96 kg/m      Reproductive/Obstetrics negative OB ROS                              Anesthesia Physical Anesthesia Plan  ASA: 2  Anesthesia Plan: General LMA   Post-op Pain Management: Regional block*   Induction: Intravenous  PONV Risk Score and Plan: 3 and Dexamethasone, Ondansetron, Midazolam and Treatment may vary due to age or medical condition  Airway Management Planned: LMA  Additional Equipment:   Intra-op Plan:   Post-operative Plan: Extubation in OR  Informed Consent: I have reviewed the patients History and Physical, chart, labs and discussed the procedure including the risks, benefits and alternatives for the proposed anesthesia with the patient or authorized representative who has indicated his/her understanding and acceptance.     Dental Advisory Given  Plan Discussed with: Anesthesiologist, CRNA and Surgeon  Anesthesia Plan Comments:          Anesthesia Quick Evaluation

## 2023-02-07 NOTE — Anesthesia Procedure Notes (Signed)
Anesthesia Regional Block: Popliteal block   Pre-Anesthetic Checklist: , timeout performed,  Correct Patient, Correct Site, Correct Laterality,  Correct Procedure, Correct Position, site marked,  Risks and benefits discussed,  Surgical consent,  Pre-op evaluation,  At surgeon's request and post-op pain management  Laterality: Upper and Right  Prep: chloraprep       Needles:  Injection technique: Single-shot  Needle Type: Stimiplex     Needle Length: 9cm  Needle Gauge: 22     Additional Needles:   Procedures:,,,, ultrasound used (permanent image in chart),,    Narrative:  Start time: 02/07/2023 7:51 AM End time: 02/07/2023 7:56 AM Injection made incrementally with aspirations every 5 mL.  Performed by: Personally  Anesthesiologist: Foye Deer, MD  Additional Notes: Patient consented for risk and benefits of nerve block including but not limited to nerve damage, failed block, bleeding and infection.  Patient voiced understanding.  Functioning IV was confirmed and monitors were applied.  Timeout done prior to procedure and prior to any sedation being given to the patient.  Patient confirmed procedure site prior to any sedation given to the patient. Sterile prep,hand hygiene and sterile gloves were used.  Minimal sedation used for procedure.  No paresthesia endorsed by patient during the procedure.  Negative aspiration and negative test dose prior to incremental administration of local anesthetic. The patient tolerated the procedure well with no immediate complications.

## 2023-02-07 NOTE — Progress Notes (Signed)
  Perioperative Services Pre-Admission/Anesthesia Testing    Date: 02/07/23  Name: Amy Garner MRN:   [id number]  Re: GLP-1 clearance and provider recommendations   Planned Surgical Procedure(s):    Case: [id number] Anesthesia Start Date/Time: 02/07/23 0758   Procedures:      HALLUX FUSION METATARSAL PHALANGEAL JOINT (Right: Foot) - POPLITEAL BLOCK     HAMMER TOE CORRECTION SECOND AND THIRD (Right: Toe)     CAPSULOTOMY METATARSOPHALANGEAL SECOND AND THIRD (Right: Toe)   Anesthesia type: General LMA   Pre-op diagnosis:      bunion     hammertoe     arthritis   Location: [facility] OR ROOM 08 / [facility]   Surgeons: Candelaria Stagers, DPM      Clinical Notes:  Patient is scheduled for the above procedure with the indicated provider/surgeon. In review of her medication reconciliation it was noted that patient is on a prescribed GLP-1 medication. Per guidelines issued by the American Society of Anesthesiologists (ASA), it is recommended that these medications be held for 7 days prior to the patient undergoing any type of elective surgical procedure. The patient is taking the following GLP-1 medication:  [x]  SEMAGLUTIDE   []  EXENATIDE  []  LIRAGLUTIDE   []  LIXISENATIDE  []  DULAGLUTIDE     []  TIRZEPATIDE (GLP-1/GIP)  Reached out to prescribing provider Amy Campanile, MD) to make them aware of the guidelines from anesthesia. Given that this patient takes the prescribed GLP-1 medication for her  diabetes diagnosis, rather than for weight loss, recommendations from the prescribing provider were solicited. Prescribing provider made aware of the following so that informed decision/POC can be developed for this patient that may be taking medications belonging to these drug classes:  Oral GLP-1 medications will be held 1 day prior to surgery.  Injectable GLP-1 medications will be held 7 days prior to surgery.  Metformin is routinely held 48 hours prior to surgery due to renal  concerns, potential need for contrasted imaging perioperatively, and the potential for tissue hypoxia leading to drug induced lactic acidosis.  All SGLT2i medications are held 72 hours prior to surgery as they can be associated with the increased potential for developing euglycemic diabetic ketoacidosis (EDKA).   Impression and Plan:  Amy Garner is on a prescribed GLP-1 medication, which induces the known side effect of decreased gastric emptying. Efforts are bring made to mitigate the risk of perioperative hyperglycemic events, as elevated blood glucose levels have been found to contribute to intra/postoperative complications. Additionally, hyperglycemic extremes can potentially necessitate the postponing of a patient's elective case in order to better optimize perioperative glycemic control, again with the aforementioned guidelines in place. With this in mind, recommendations have been sought from the prescribing provider, who has cleared patient to proceed with holding the prescribed GLP-1 as per the guidelines from the ASA.   Provider recommending: no further recommendations received from the prescribing provider.  Copy of signed clearance and recommendations placed on patient's chart for inclusion in their medical record and for review by the surgical/anesthetic team on the day of her procedure.   Quentin Mulling, MSN, APRN, FNP-C, CEN [facility]  Perioperative Services Nurse Practitioner Phone: [phone] Fax: [phone] 02/07/23 0700 AM  NOTE: This note has been prepared using Dragon dictation software. Despite my best ability to proofread, there is always the potential that unintentional transcriptional errors may still occur from this process.

## 2023-02-07 NOTE — Discharge Instructions (Addendum)
After Surgery Instructions   1) If you are recuperating from surgery anywhere other than home, please be sure to leave [place] the number where you can be reached.  2) Go directly home and rest.  3) Keep the operated foot(feet) elevated six inches above the hip when sitting or lying down. This will help control swelling and pain.  4) Support the elevated foot and leg with pillows. DO NOT PLACE PILLOWS UNDER THE KNEE.  5) DO NOT REMOVE or get your bandages WET, unless you were given different instructions by your doctor to do so. This increases the risk of infection.  6) Wear your surgical shoe or surgical boot at all times when you are up on your feet.  7) A limited amount of pain and swelling may occur. The skin may take on a bruised appearance. DO NOT BE ALARMED, THIS IS NORMAL.  8) For slight pain and swelling, apply an ice pack directly over the bandages for 15 minutes only out of each hour of the day. Continue until seen in the office for your first post op visit. DON NOT     APPLY ANY FORM OF HEAT TO THE AREA.  9) Have prescriptions filled immediately and take as directed.  10) Drink lots of liquids, water and juice to stay hydrated.  11) CALL IMMEDIATELY IF:  *Bleeding continues until the following day of surgery  *Pain increases and/or does not respond to medication  *Bandages or cast appears to tight  *If your bandage gets wet  *Trip, fall or stump your surgical foot  *If your temperature goes above 101  *If you have ANY questions at all  YOU NOW CONTROL THE EFFORT OF YOUR RECOVERY. ADHERING TO THESE INSTRUCTIONS WILL OFFER YOU THE MOST COMPLETE RESULTS   AMBULATORY SURGERY  DISCHARGE INSTRUCTIONS   The drugs that you were given will stay in your system until tomorrow so for the next 24 hours you should not:  Drive an automobile Make any legal decisions Drink any alcoholic beverage   You may resume regular meals tomorrow.  Today it is better to start with liquids and  gradually work up to solid foods.  You may eat anything you prefer, but it is better to start with liquids, then soup and crackers, and gradually work up to solid foods.   Please notify your doctor immediately if you have any unusual bleeding, trouble breathing, redness and pain at the surgery site, drainage, fever, or pain not relieved by medication.    Additional Instructions: PLEASE LEAVE EXPAREL (TEAL) ARMBAND ON FOR 4 DAYS    Please contact your physician with any problems or Same Day Surgery at [phone], Monday through Friday 6 am to 4 pm, or [place] at [facility] number at [phone].

## 2023-02-08 ENCOUNTER — Encounter: Payer: Self-pay | Admitting: Podiatry

## 2023-02-08 NOTE — Op Note (Signed)
Surgeon: Surgeon(s): Candelaria Stagers, DPM  Assistants: Rica Koyanagi Pre-operative diagnosis: bunion hammertoe arthritis  Post-operative diagnosis: same Procedure: Procedure(s) (LRB): HALLUX FUSION METATARSAL PHALANGEAL JOINT (Right) HAMMER TOE CORRECTION SECOND AND THIRD (Right) CAPSULOTOMY METATARSOPHALANGEAL SECOND AND THIRD (Right)  Pathology: * No specimens in log *  Pertinent Intra-op findings: Significant arthritis noted along with hammertoe contracture of second and third digit noted* Anesthesia: General  Hemostasis:  Total Tourniquet Time Documented: Calf (Right) - 69 minutes Total: Calf (Right) - 69 minutes  EBL: 5 mL  Materials: 3-0 Monocryl 3-0 Prolene Injectables: None Complications: None  Indications for surgery: A 67 y.o. female presents with right first metatarsophalangeal joint arthritis with second and third digit hammertoe contracture painful. Patient has failed all conservative therapy including but not limited to shoe gear modification padding protecting offloading steroid injection. She wishes to have surgical correction of the foot/deformity. It was determined that patient would benefit from right first metatarsophalangeal joint arthrodesis with hammertoe correction of second and third. Informed surgical risk consent was reviewed and read aloud to the patient.  I reviewed the films.  I have discussed my findings with the patient in great detail.  I have discussed all risks including but not limited to infection, stiffness, scarring, limp, disability, deformity, damage to blood vessels and nerves, numbness, poor healing, need for braces, arthritis, chronic pain, amputation, death.  All benefits and realistic expectations discussed in great detail.  I have made no promises as to the outcome.  I have provided realistic expectations.  I have offered the patient a 2nd opinion, which they have declined and assured me they preferred to proceed despite the risks   Procedure in  detail: The patient was both verbally and visually identified by myself, the nursing staff, and anesthesia staff in the preoperative holding area. They were then transferred to the operating room and placed on the operative table in supine position.  Attention was directed to the dorsal aspect of the right first metatarsophalangeal joint where a linear skin incision approximately 6cm long was made in the skin using a #15 blade. This incision was carried down through the subcutaneous tissue taking care to clamp and cauterize all neurovascular structures as necessary.  The capsule of 1st MPJ was identified. A linear capsulotomy was then performed in-line with the original skin incision and the capsule was reflected both medially and laterally to expose the head of the first metatarsal and the base of the proximal phalanx. The articular surface of the 1st metatarsal head was noted to show evidence of [moderate] degeneration under direct visualization. The sagittal saw was then used to resect the dorsal, medial, and lateral prominences off of the 1st metatarsal. A rongeur was utilized to remove the dorsal exostosis of the proximal phalanx. A curette was used to remove some of the cartilage, and the remaining cartilage was removed using a burr until punctate bleeding could be noted on the metatarsal head and base of proximal phalanx. The site as flushed with copious amounts of sterile saline.  The distal aspect of the 1st metatarsal and the base of the proximal phalanx were then subchondrally drilled utilizing a pineapple burr. A Temporary K wire was then placed from distal-medial to proximal-lateral across the 1st MPJ, and the position was confirmed to be satisfactory utilizing fluoroscopy.. A dorsal right1st MPJ fusion locking plate was then applied and secured with two olive wires.  interfragmentary screw was inserted in lag fashion standard 4.0 cancellous.  The position was confirmed to be satisfactory with  fluoroscopy, and two screws were placed both proximally and distally for a total of four Nonlocking/locking screws utilized. The 1st MPJ was then stressed intraoperatively and no motion or gapping was noted across the fusion site. Final imaging via fluoroscopy was taken to confirm adequate placement and rectus position of the hallux. The surgical site was copiously irrigated with sterile saline. The capsule was then reapproximated with 3-0 Monocortical in a simple interrupted fashion. The subcutaneous tissue was then reapproximated with 4-0 monocryl in a running fashion. The subcuticular was performed utilizing 5-0 Monocryl.   Attention was directed to the second and third digit of the foot A longitudinal incision was made over the proximal interphalangeal joint of the second digit of the foot. A very small incision was made and carried deep with a combination of sharp and blunt dissection to the level of the distal interphalangeal joint where longitudinal tenotomy was performed. A capsulotomy was performed revealing the head of theproximal phalanx through the wound. The head of this phalanx was sharply resected with the sagittal saw, recontoured to a normal anatomical resemblance.  It was also noted patient will need a further release at the metatarsophalangeal joint at this time using McGlamery elevator the incision incision was carried further back and using McGlamery elevator the second metatarsal head capsulotomy was performed in standard technique.  Then a 0.0 45inch Kwire was placed across the osteotomy site for stabilization this was inserted into respective metatarsal heads. correctional alignment noted as seen on fluoroscopyThe site was flushed, and the tendon was repaired with 3-0 Monocryl. The cutaneous closure was made with a 3-0 Prolene in a simple fashion.  At the conclusion of the procedure the patient was awoken from anesthesia and found to have tolerated the procedure well any  complications. There were transferred to PACU with vital signs stable and vascular status intact.  Nicholes Rough, DPM

## 2023-02-11 ENCOUNTER — Ambulatory Visit: Payer: Medicare HMO | Admitting: Family Medicine

## 2023-02-16 ENCOUNTER — Ambulatory Visit (INDEPENDENT_AMBULATORY_CARE_PROVIDER_SITE_OTHER): Payer: Medicare HMO | Admitting: Podiatry

## 2023-02-16 ENCOUNTER — Ambulatory Visit (INDEPENDENT_AMBULATORY_CARE_PROVIDER_SITE_OTHER): Payer: Medicare HMO

## 2023-02-16 DIAGNOSIS — M2041 Other hammer toe(s) (acquired), right foot: Secondary | ICD-10-CM

## 2023-02-16 DIAGNOSIS — Z9889 Other specified postprocedural states: Secondary | ICD-10-CM

## 2023-02-16 DIAGNOSIS — M19071 Primary osteoarthritis, right ankle and foot: Secondary | ICD-10-CM

## 2023-02-16 NOTE — Progress Notes (Signed)
Subjective:  Patient ID: Amy Garner, female    DOB: June 27, 1955,  MRN: [id number]  Chief Complaint  Patient presents with   Routine Post Op    POV # 1 DOS 02/07/23 --- RIGHT 1ST MPJ FUSION WITH FIXATION WITH 2,3 HAMMERTOE REPAIR WITH RESPECTIVE CAPSULOTOMY    DOS: 02/07/2023 Procedure: Right first metatarsophalangeal joint fusion with hammertoe repair of second and third with capsulotomy  67 y.o. female returns for post-op check.  Patient states that she is doing well pain is controlled nonweightbearing to the right lower extremity with knee scooter.  Bandages clean dry and intact.  Review of Systems: Negative except as noted in the HPI. Denies N/V/F/Ch.  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 (Patient not taking: Reported on 01/21/2023), Disp: 20 tablet, Rfl: 1   escitalopram (LEXAPRO) 20 MG tablet, Take 1 tablet (20 mg total) by mouth daily., Disp: 90 tablet, Rfl: 0   esomeprazole (NEXIUM) 40 MG capsule, TAKE 1 CAPSULE (40 MG TOTAL) BY MOUTH IN THE MORNING., Disp: 90 capsule, Rfl: 3   FLUoxetine (PROZAC) 20  MG capsule, Take 1 capsule (20 mg total) by mouth daily., Disp: 90 capsule, Rfl: 0   glucose blood (TRUE METRIX BLOOD GLUCOSE TEST) test strip, Use as instructed, Disp: 100 each, Rfl: 12   glucose blood test strip, Use as instructed, Disp: 100 each, Rfl: 5   ibuprofen (ADVIL) 800 MG tablet, Take 1 tablet (800 mg total) by mouth every 6 (six) hours as needed., Disp: 60 tablet, Rfl: 1   insulin glargine, 1 Unit Dial, (TOUJEO SOLOSTAR) 300 UNIT/ML Solostar Pen, Inject 62 Units into the skin daily. (Patient taking differently: Inject 62 Units into the skin daily. qam), Disp: 4.5 mL, Rfl: 6   Insulin Pen Needle (PEN NEEDLES) 32G X 4 MM MISC, Use to inject insulin once daily., Disp: 100 each, Rfl: 3   losartan (COZAAR) 25 MG tablet, TAKE 1 TABLET EVERY DAY, Disp: 90 tablet, Rfl: 3   lovastatin (MEVACOR) 10 MG tablet, TAKE 1 TABLET AT BEDTIME, Disp: 90 tablet, Rfl: 3   meclizine (ANTIVERT) 25 MG tablet, Take 1 tablet (25 mg total) by mouth 3 (three) times daily as needed for dizziness., Disp: 30 tablet, Rfl: 0   metFORMIN (GLUCOPHAGE-XR) 500 MG 24 hr tablet, TAKE 2 TABLETS twice daily with meals., Disp: 360 tablet, Rfl: 2   Misc. Devices MISC, TRUE MATRIX METER, Disp: 1 each, Rfl: 0   oxyCODONE-acetaminophen (  PERCOCET) 5-325 MG tablet, Take 1 tablet by mouth every 4 (four) hours as needed for severe pain., Disp: 30 tablet, Rfl: 0   Semaglutide, 2 MG/DOSE, (OZEMPIC, 2 MG/DOSE,) 8 MG/3ML SOPN, INJECT 2MG  UNDER THE SKIN ONE TIME WEEKLY AS DIRECTED, Disp: 9 mL, Rfl: 1   triamterene-hydrochlorothiazide (DYAZIDE) 37.5-25 MG capsule, TAKE 1 CAPSULE EVERY DAY, Disp: 90 capsule, Rfl: 3   TRUEplus Lancets 33G MISC, USE TO TAKE BLOOD SUGAR 2 TIMES A DAY, Disp: 100 each, Rfl: 2  Social History   Tobacco Use  Smoking Status Former   Current packs/day: 0.00   Average packs/day: 1.1 packs/day for 20.0 years (22.5 ttl pk-yrs)   Types: Cigarettes   Start date: 2002   Quit date: 2017   Years since quitting: 7.7   Smokeless Tobacco Never    Allergies  Allergen Reactions   Atenolol Anaphylaxis   Codeine Other (See Comments)    Pt says she gets "crazy"   Objective:  There were no vitals filed for this visit. There is no height or weight on file to calculate BMI. Constitutional Well developed. Well nourished.  Vascular Foot warm and well perfused. Capillary refill normal to all digits.   Neurologic Normal speech. Oriented to person, place, and time. Epicritic sensation to light touch grossly present bilaterally.  Dermatologic Skin healing well without signs of infection. Skin edges well coapted without signs of infection.  Orthopedic: Tenderness to palpation noted about the surgical site.   Radiographs: 3 views of skeletally mature the right foot: First metatarsophalangeal joint fusion noted.  Hardware is intact no signs of backing or loosening noted.  Hammertoe contracture noted of fourth and fifth.  Hardware Hammertoe fixation is intact Assessment:   1. Arthritis of first metatarsophalangeal (MTP) joint of right foot   2. Hammer toe of right foot   3. S/P foot surgery    Plan:  Patient was evaluated and treated and all questions answered.  S/p foot surgery right -Progressing as expected post-operatively. -XR: See above -WB Status: Nonweightbearing in right lower extremity -Sutures: Intact.  No clinical signs of Deis is noted.  No complication noted. -Medications: None -Foot redressed.  No follow-ups on file.

## 2023-02-18 ENCOUNTER — Other Ambulatory Visit: Payer: Self-pay | Admitting: Family Medicine

## 2023-02-18 DIAGNOSIS — Z1211 Encounter for screening for malignant neoplasm of colon: Secondary | ICD-10-CM

## 2023-02-18 DIAGNOSIS — Z1212 Encounter for screening for malignant neoplasm of rectum: Secondary | ICD-10-CM

## 2023-02-21 ENCOUNTER — Telehealth: Payer: Self-pay

## 2023-02-21 NOTE — Telephone Encounter (Signed)
Patient called and left a message - her second toe that was blue is not black and the blackness seems to be spreading around her toe - she is concerned that if she waits until her appointment on 10/16 that she will lose her toe. Please advise if she should come in sooner for pin removal. Thanks

## 2023-02-22 ENCOUNTER — Other Ambulatory Visit: Payer: Self-pay | Admitting: Family Medicine

## 2023-02-22 ENCOUNTER — Ambulatory Visit (INDEPENDENT_AMBULATORY_CARE_PROVIDER_SITE_OTHER): Payer: Medicare HMO | Admitting: Podiatry

## 2023-02-22 DIAGNOSIS — Z9889 Other specified postprocedural states: Secondary | ICD-10-CM

## 2023-02-22 DIAGNOSIS — M2041 Other hammer toe(s) (acquired), right foot: Secondary | ICD-10-CM

## 2023-02-22 DIAGNOSIS — M19071 Primary osteoarthritis, right ankle and foot: Secondary | ICD-10-CM

## 2023-02-22 MED ORDER — DOXYCYCLINE HYCLATE 100 MG PO TABS: 100.0000 mg | ORAL_TABLET | Freq: Two times a day (BID) | ORAL | 0 refills | Status: DC

## 2023-02-22 NOTE — Progress Notes (Signed)
Subjective:  Patient ID: Amy Garner, female    DOB: July 26, 1955,  MRN: [id number]  No chief complaint on file.   DOS: 02/07/2023 Procedure: Right first metatarsophalangeal joint fusion with hammertoe repair of second and third with capsulotomy  67 y.o. female returns for post-op check.  Patient states that she is doing well pain is controlled nonweightbearing to the right lower extremity with knee scooter.  Bandages clean dry and intact.  Review of Systems: Negative except as noted in the HPI. Denies N/V/F/Ch.  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    Peptic ulcer    Pneumonia    Type 2 diabetes mellitus (HCC)     Current Outpatient Medications:    doxycycline (VIBRA-TABS) 100 MG tablet, Take 1 tablet (100 mg total) by mouth 2 (two) times daily., Disp: 20 tablet, Rfl: 0   albuterol (VENTOLIN HFA) 108 (90 Base) MCG/ACT inhaler, Inhale 2 puffs into the lungs every 6 (six) hours as needed for wheezing or shortness of breath., Disp: 18 g, Rfl: 5   Alcohol Swabs PADS, USE TO TAKE BLOOD SUGAR 2 TIMES A DAY, Disp: 120 each, Rfl: 2   cholecalciferol (VITAMIN D3) 25 MCG (1000 UNIT) tablet, Take 1,000 Units by mouth daily., Disp: , Rfl:    doxycycline (VIBRA-TABS) 100 MG tablet, Take 1 tablet (100 mg total) by mouth 2 (two) times daily. (Patient not taking: Reported on 01/21/2023), Disp: 20 tablet, Rfl: 1   escitalopram (LEXAPRO) 20 MG tablet, Take 1 tablet (20 mg total) by mouth daily., Disp: 90 tablet, Rfl: 0   esomeprazole (NEXIUM) 40 MG capsule, TAKE 1 CAPSULE (40 MG TOTAL) BY MOUTH IN THE MORNING., Disp: 90 capsule, Rfl: 3   FLUoxetine (PROZAC) 20 MG capsule, Take 1  capsule (20 mg total) by mouth daily., Disp: 90 capsule, Rfl: 0   glucose blood (TRUE METRIX BLOOD GLUCOSE TEST) test strip, Use as instructed, Disp: 100 each, Rfl: 12   glucose blood test strip, Use as instructed, Disp: 100 each, Rfl: 5   ibuprofen (ADVIL) 800 MG tablet, Take 1 tablet (800 mg total) by mouth every 6 (six) hours as needed., Disp: 60 tablet, Rfl: 1   insulin glargine, 1 Unit Dial, (TOUJEO SOLOSTAR) 300 UNIT/ML Solostar Pen, INJECT 62 UNITS INTO THE SKIN DAILY., Disp: 4.5 mL, Rfl: 3   Insulin Pen Needle (PEN NEEDLES) 32G X 4 MM MISC, Use to inject insulin once daily., Disp: 100 each, Rfl: 3   losartan (COZAAR) 25 MG tablet, TAKE 1 TABLET EVERY DAY, Disp: 90 tablet, Rfl: 3   lovastatin (MEVACOR) 10 MG tablet, TAKE 1 TABLET AT BEDTIME, Disp: 90 tablet, Rfl: 3   meclizine (ANTIVERT) 25 MG tablet, Take 1 tablet (25 mg total) by mouth 3 (three) times daily as needed for dizziness., Disp: 30 tablet, Rfl: 0   metFORMIN (GLUCOPHAGE-XR) 500 MG 24 hr tablet, TAKE 2 TABLETS twice daily with meals., Disp: 360 tablet, Rfl: 2   Misc. Devices MISC, TRUE MATRIX METER, Disp: 1 each, Rfl: 0   oxyCODONE-acetaminophen (PERCOCET) 5-325 MG tablet, Take 1 tablet by mouth every 4 (four) hours as needed for  severe pain., Disp: 30 tablet, Rfl: 0   Semaglutide, 2 MG/DOSE, (OZEMPIC, 2 MG/DOSE,) 8 MG/3ML SOPN, INJECT 2MG  UNDER THE SKIN ONE TIME WEEKLY AS DIRECTED, Disp: 9 mL, Rfl: 1   triamterene-hydrochlorothiazide (DYAZIDE) 37.5-25 MG capsule, TAKE 1 CAPSULE EVERY DAY, Disp: 90 capsule, Rfl: 3   TRUEplus Lancets 33G MISC, USE TO TAKE BLOOD SUGAR 2 TIMES A DAY, Disp: 100 each, Rfl: 2  Social History   Tobacco Use  Smoking Status Former   Current packs/day: 0.00   Average packs/day: 1.1 packs/day for 20.0 years (22.5 ttl pk-yrs)   Types: Cigarettes   Start date: 2002   Quit date: 2017   Years since quitting: 7.7  Smokeless Tobacco Never    Allergies  Allergen Reactions   Atenolol Anaphylaxis    Codeine Other (See Comments)    Pt says she gets "crazy"   Objective:  There were no vitals filed for this visit. There is no height or weight on file to calculate BMI. Constitutional Well developed. Well nourished.  Vascular Foot warm and well perfused. Capillary refill normal to all digits.   Neurologic Normal speech. Oriented to person, place, and time. Epicritic sensation to light touch grossly present bilaterally.  Dermatologic Skin healing well without signs of infection. Skin edges well coapted without signs of infection.  Left second digit dark discoloration at the distal tip.  Cap refill noted.  Orthopedic: Tenderness to palpation noted about the surgical site.   Radiographs: 3 views of skeletally mature the right foot: First metatarsophalangeal joint fusion noted.  Hardware is intact no signs of backing or loosening noted.  Hammertoe contracture noted of fourth and fifth.  Hardware Hammertoe fixation is intact Assessment:   1. Arthritis of first metatarsophalangeal (MTP) joint of right foot   2. Hammer toe of right foot   3. S/P foot surgery     Plan:  Patient was evaluated and treated and all questions answered.  S/p foot surgery right -Progressing as expected post-operatively. -XR: See above -WB Status: Nonweightbearing in right lower extremity -Sutures: Intact.  No clinical signs of Deis is noted.  No complication noted. -Medications: None -Pin was removed of the second toe primarily due to distal tip darkness/superficial breakdown.  It appears to be improving.  No follow-ups on file.

## 2023-03-02 ENCOUNTER — Encounter: Payer: Self-pay | Admitting: Podiatry

## 2023-03-02 ENCOUNTER — Encounter: Payer: Medicare HMO | Admitting: Podiatry

## 2023-03-02 ENCOUNTER — Ambulatory Visit (INDEPENDENT_AMBULATORY_CARE_PROVIDER_SITE_OTHER): Payer: Medicare HMO | Admitting: Podiatry

## 2023-03-02 VITALS — Ht 66.0 in | Wt 229.0 lb

## 2023-03-02 DIAGNOSIS — M19071 Primary osteoarthritis, right ankle and foot: Secondary | ICD-10-CM

## 2023-03-02 DIAGNOSIS — M2041 Other hammer toe(s) (acquired), right foot: Secondary | ICD-10-CM

## 2023-03-02 DIAGNOSIS — Z9889 Other specified postprocedural states: Secondary | ICD-10-CM

## 2023-03-02 NOTE — Progress Notes (Signed)
Subjective:  Patient ID: Amy Garner, female    DOB: 1955/11/30,  MRN: [id number]  Chief Complaint  Patient presents with   Routine Post Op    POV # 2 DOS 02/07/23 --- RIGHT 1ST MPJ FUSION WITH FIXATION WITH 2,3 HAMMERTOE REPAIR WITH RESPECTIVE CAPSULOTOMY     DOS: 02/07/2023 Procedure: Right first metatarsophalangeal joint fusion with hammertoe repair of second and third with capsulotomy  67 y.o. female returns for post-op check.  Patient states that she is doing well pain is controlled nonweightbearing to the right lower extremity with knee scooter.  Bandages clean dry and intact.  Review of Systems: Negative except as noted in the HPI. Denies N/V/F/Ch.  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    Peptic ulcer    Pneumonia    Type 2 diabetes mellitus (HCC)     Current Outpatient Medications:    albuterol (VENTOLIN HFA) 108 (90 Base) MCG/ACT inhaler, Inhale 2 puffs into the lungs every 6 (six) hours as needed for wheezing or shortness of breath., Disp: 18 g, Rfl: 5   Alcohol Swabs PADS, USE TO TAKE BLOOD SUGAR 2 TIMES A DAY, Disp: 120 each, Rfl: 2   cholecalciferol (VITAMIN D3) 25 MCG (1000 UNIT) tablet, Take 1,000 Units by mouth daily., Disp: , Rfl:    doxycycline (VIBRA-TABS) 100 MG tablet, Take 1 tablet (100 mg total) by mouth 2 (two) times daily., Disp: 20 tablet, Rfl: 1   doxycycline (VIBRA-TABS) 100 MG tablet, Take 1 tablet (100 mg total) by mouth 2 (two) times daily., Disp: 20 tablet, Rfl: 0   escitalopram (LEXAPRO) 20 MG tablet, Take 1 tablet (20 mg total) by mouth daily., Disp: 90 tablet, Rfl: 0   esomeprazole (NEXIUM) 40 MG capsule, TAKE 1 CAPSULE (40 MG  TOTAL) BY MOUTH IN THE MORNING., Disp: 90 capsule, Rfl: 3   FLUoxetine (PROZAC) 20 MG capsule, Take 1 capsule (20 mg total) by mouth daily., Disp: 90 capsule, Rfl: 0   glucose blood (TRUE METRIX BLOOD GLUCOSE TEST) test strip, Use as instructed, Disp: 100 each, Rfl: 12   glucose blood test strip, Use as instructed, Disp: 100 each, Rfl: 5   ibuprofen (ADVIL) 800 MG tablet, Take 1 tablet (800 mg total) by mouth every 6 (six) hours as needed., Disp: 60 tablet, Rfl: 1   insulin glargine, 1 Unit Dial, (TOUJEO SOLOSTAR) 300 UNIT/ML Solostar Pen, INJECT 62 UNITS INTO THE SKIN DAILY., Disp: 4.5 mL, Rfl: 3   Insulin Pen Needle (PEN NEEDLES) 32G X 4 MM MISC, Use to inject insulin once daily., Disp: 100 each, Rfl: 3   losartan (COZAAR) 25 MG tablet, TAKE 1 TABLET EVERY DAY, Disp: 90 tablet, Rfl: 3   lovastatin (MEVACOR) 10 MG tablet, TAKE 1 TABLET AT BEDTIME, Disp: 90 tablet, Rfl: 3   meclizine (ANTIVERT) 25 MG tablet, Take 1 tablet (25 mg total) by mouth 3 (three) times daily as needed for dizziness., Disp: 30 tablet, Rfl: 0   metFORMIN (GLUCOPHAGE-XR) 500 MG 24 hr tablet, TAKE 2 TABLETS twice daily with meals., Disp: 360 tablet, Rfl: 2   Misc. Devices MISC, TRUE MATRIX METER,  Disp: 1 each, Rfl: 0   oxyCODONE-acetaminophen (PERCOCET) 5-325 MG tablet, Take 1 tablet by mouth every 4 (four) hours as needed for severe pain., Disp: 30 tablet, Rfl: 0   Semaglutide, 2 MG/DOSE, (OZEMPIC, 2 MG/DOSE,) 8 MG/3ML SOPN, INJECT 2MG  UNDER THE SKIN ONE TIME WEEKLY AS DIRECTED, Disp: 9 mL, Rfl: 1   triamterene-hydrochlorothiazide (DYAZIDE) 37.5-25 MG capsule, TAKE 1 CAPSULE EVERY DAY, Disp: 90 capsule, Rfl: 3   TRUEplus Lancets 33G MISC, USE TO TAKE BLOOD SUGAR 2 TIMES A DAY, Disp: 100 each, Rfl: 2  Social History   Tobacco Use  Smoking Status Former   Current packs/day: 0.00   Average packs/day: 1.1 packs/day for 20.0 years (22.5 ttl pk-yrs)   Types: Cigarettes   Start date: 2002   Quit date: 2017   Years since  quitting: 7.7  Smokeless Tobacco Never    Allergies  Allergen Reactions   Atenolol Anaphylaxis   Codeine Other (See Comments)    Pt says she gets "crazy"   Objective:  There were no vitals filed for this visit. Body mass index is 36.96 kg/m. Constitutional Well developed. Well nourished.  Vascular Foot warm and well perfused. Capillary refill normal to all digits.   Neurologic Normal speech. Oriented to person, place, and time. Epicritic sensation to light touch grossly present bilaterally.  Dermatologic Skin healing well without signs of infection. Skin edges well coapted without signs of infection.  Left second digit dark discoloration at the distal tip which is improving.  Cap refill noted.  Unlikely  Orthopedic: Mild tenderness to palpation noted about the surgical site.   Radiographs: 3 views of skeletally mature the right foot: First metatarsophalangeal joint fusion noted.  Hardware is intact no signs of backing or loosening noted.  Hammertoe contracture noted of fourth and fifth.  Hardware Hammertoe fixation is intact Assessment:   1. Arthritis of first metatarsophalangeal (MTP) joint of right foot   2. Hammer toe of right foot   3. S/P foot surgery      Plan:  Patient was evaluated and treated and all questions answered.  S/p foot surgery right -Progressing as expected post-operatively. -XR: See above -WB Status: Nonweightbearing in right lower extremity -Sutures: Removed n superficial dehiscence noted to the second digit no complication noted. -Medications: None -Second toe seems to be improving.  There is macerated tissue likely due to hyperhidrosis encouraged Betadine wet-to-dry dressing to all incisions.  No follow-ups on file.

## 2023-03-16 ENCOUNTER — Ambulatory Visit: Payer: Medicare HMO | Admitting: Podiatry

## 2023-03-16 ENCOUNTER — Encounter: Payer: Self-pay | Admitting: Podiatry

## 2023-03-16 DIAGNOSIS — M2041 Other hammer toe(s) (acquired), right foot: Secondary | ICD-10-CM

## 2023-03-16 DIAGNOSIS — Z9889 Other specified postprocedural states: Secondary | ICD-10-CM

## 2023-03-16 DIAGNOSIS — M19071 Primary osteoarthritis, right ankle and foot: Secondary | ICD-10-CM | POA: Diagnosis not present

## 2023-03-16 MED ORDER — DOXYCYCLINE HYCLATE 100 MG PO TABS: 100.0000 mg | ORAL_TABLET | Freq: Two times a day (BID) | ORAL | 0 refills | Status: DC

## 2023-03-16 NOTE — Progress Notes (Signed)
Subjective:  Patient ID: Amy Garner, female    DOB: 09-26-1955,  MRN: [id number]  Chief Complaint  Patient presents with   Foot Pain    Follow up foot pain patient wearing boot feeling better    DOS: 02/07/2023 Procedure: Right first metatarsophalangeal joint fusion with hammertoe repair of second and third with capsulotomy  67 y.o. female returns for post-op check.  Patient states that she is doing well pain is controlled nonweightbearing to the right lower extremity with knee scooter.  Bandages clean dry and intact.  Review of Systems: Negative except as noted in the HPI. Denies N/V/F/Ch.  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    Peptic ulcer    Pneumonia    Type 2 diabetes mellitus (HCC)     Current Outpatient Medications:    albuterol (VENTOLIN HFA) 108 (90 Base) MCG/ACT inhaler, Inhale 2 puffs into the lungs every 6 (six) hours as needed for wheezing or shortness of breath., Disp: 18 g, Rfl: 5   Alcohol Swabs PADS, USE TO TAKE BLOOD SUGAR 2 TIMES A DAY, Disp: 120 each, Rfl: 2   cholecalciferol (VITAMIN D3) 25 MCG (1000 UNIT) tablet, Take 1,000 Units by mouth daily., Disp: , Rfl:    doxycycline (VIBRA-TABS) 100 MG tablet, Take 1 tablet (100 mg total) by mouth 2 (two) times daily., Disp: 20 tablet, Rfl: 0   escitalopram (LEXAPRO) 20 MG tablet, Take 1 tablet (20 mg total) by mouth daily., Disp: 90 tablet, Rfl: 0   esomeprazole (NEXIUM) 40 MG capsule, TAKE 1 CAPSULE (40 MG TOTAL) BY MOUTH IN THE MORNING., Disp: 90 capsule, Rfl: 3   FLUoxetine (PROZAC) 20 MG capsule, Take 1 capsule (20 mg total) by mouth daily., Disp: 90 capsule, Rfl: 0   glucose blood (TRUE  METRIX BLOOD GLUCOSE TEST) test strip, Use as instructed, Disp: 100 each, Rfl: 12   glucose blood test strip, Use as instructed, Disp: 100 each, Rfl: 5   ibuprofen (ADVIL) 800 MG tablet, Take 1 tablet (800 mg total) by mouth every 6 (six) hours as needed., Disp: 60 tablet, Rfl: 1   insulin glargine, 1 Unit Dial, (TOUJEO SOLOSTAR) 300 UNIT/ML Solostar Pen, INJECT 62 UNITS INTO THE SKIN DAILY., Disp: 4.5 mL, Rfl: 3   Insulin Pen Needle (PEN NEEDLES) 32G X 4 MM MISC, Use to inject insulin once daily., Disp: 100 each, Rfl: 3   losartan (COZAAR) 25 MG tablet, TAKE 1 TABLET EVERY DAY, Disp: 90 tablet, Rfl: 3   lovastatin (MEVACOR) 10 MG tablet, TAKE 1 TABLET AT BEDTIME, Disp: 90 tablet, Rfl: 3   meclizine (ANTIVERT) 25 MG tablet, Take 1 tablet (25 mg total) by mouth 3 (three) times daily as needed for dizziness., Disp: 30 tablet, Rfl: 0   metFORMIN (GLUCOPHAGE-XR) 500 MG 24 hr tablet, TAKE 2 TABLETS twice daily with meals., Disp: 360 tablet, Rfl: 2   Misc. Devices MISC, TRUE MATRIX METER, Disp: 1 each, Rfl: 0   oxyCODONE-acetaminophen (PERCOCET) 5-325 MG tablet, Take 1 tablet by mouth every 4 (four) hours as needed for severe pain., Disp: 30 tablet, Rfl: 0   Semaglutide, 2 MG/DOSE, (  OZEMPIC, 2 MG/DOSE,) 8 MG/3ML SOPN, INJECT 2MG  UNDER THE SKIN ONE TIME WEEKLY AS DIRECTED, Disp: 9 mL, Rfl: 1   triamterene-hydrochlorothiazide (DYAZIDE) 37.5-25 MG capsule, TAKE 1 CAPSULE EVERY DAY, Disp: 90 capsule, Rfl: 3   TRUEplus Lancets 33G MISC, USE TO TAKE BLOOD SUGAR 2 TIMES A DAY, Disp: 100 each, Rfl: 2   doxycycline (VIBRA-TABS) 100 MG tablet, Take 1 tablet (100 mg total) by mouth 2 (two) times daily., Disp: 60 tablet, Rfl: 0  Social History   Tobacco Use  Smoking Status Former   Current packs/day: 0.00   Average packs/day: 1.1 packs/day for 20.0 years (22.5 ttl pk-yrs)   Types: Cigarettes   Start date: 2002   Quit date: 2017   Years since quitting: 7.8  Smokeless Tobacco Never    Allergies  Allergen  Reactions   Atenolol Anaphylaxis   Codeine Other (See Comments)    Pt says she gets "crazy"   Objective:  There were no vitals filed for this visit. There is no height or weight on file to calculate BMI. Constitutional Well developed. Well nourished.  Vascular Foot warm and well perfused. Capillary refill normal to all digits.   Neurologic Normal speech. Oriented to person, place, and time. Epicritic sensation to light touch grossly present bilaterally.  Dermatologic Skin healing well without signs of infection. Skin edges well coapted without signs of infection.  Left second digit dark discoloration at the distal tip which is improving.  Cap refill noted.  Unlikely  Orthopedic: Mild tenderness to palpation noted about the surgical site.   Radiographs: 3 views of skeletally mature the right foot: First metatarsophalangeal joint fusion noted.  Hardware is intact no signs of backing or loosening noted.  Hammertoe contracture noted of fourth and fifth.  Hardware Hammertoe fixation is intact Assessment:   1. Arthritis of first metatarsophalangeal (MTP) joint of right foot   2. Hammer toe of right foot   3. S/P foot surgery       Plan:  Patient was evaluated and treated and all questions answered.  S/p foot surgery right -Progressing as expected post-operatively. -XR: See above -WB Status: Nonweightbearing in right lower extremity -Sutures: Removed n superficial dehiscence noted to the second digit no complication noted. -Medications: None -Second toe seems to be improving.  Pin was removed from third digit. No complications noted. Continue betadine dressing -Continue doxy indefinitely until improvement  -Patient is high risk for amputation  -Sx shoe dispensed  No follow-ups on file.

## 2023-03-21 IMAGING — DX DG KNEE COMPLETE 4+V*R*
4 series · 4 of 4 positions shown · non-contrast
Comparison: None.

CLINICAL DATA: Fall, pain

EXAM:
DG HIP (WITH OR WITHOUT PELVIS) 2-3V RIGHT; RIGHT ANKLE - COMPLETE
3+ VIEW; RIGHT KNEE - COMPLETE 4+ VIEW; RIGHT FOOT COMPLETE - 3+
VIEW

[knee ap]
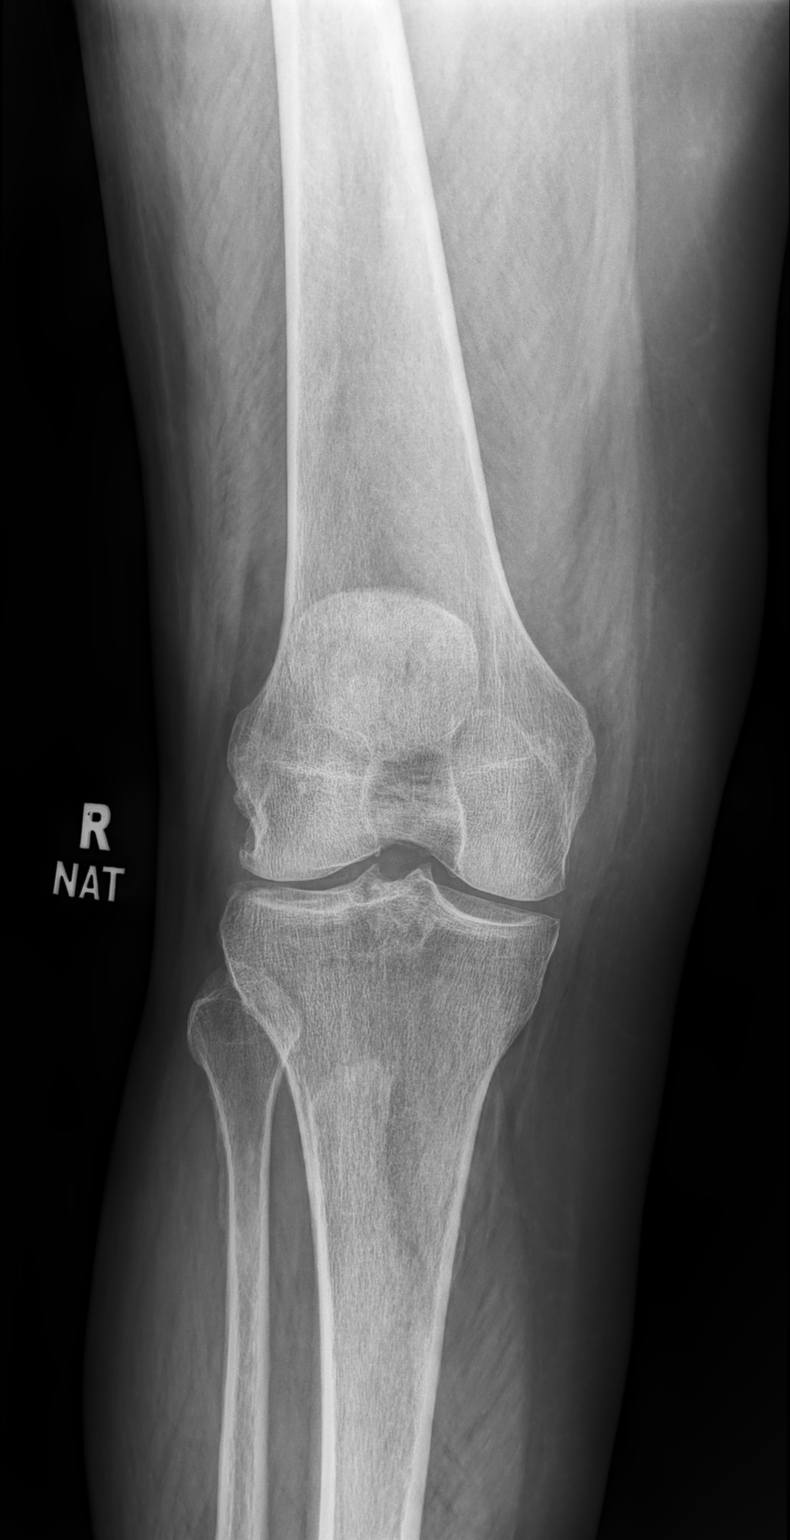

[knee lmo]
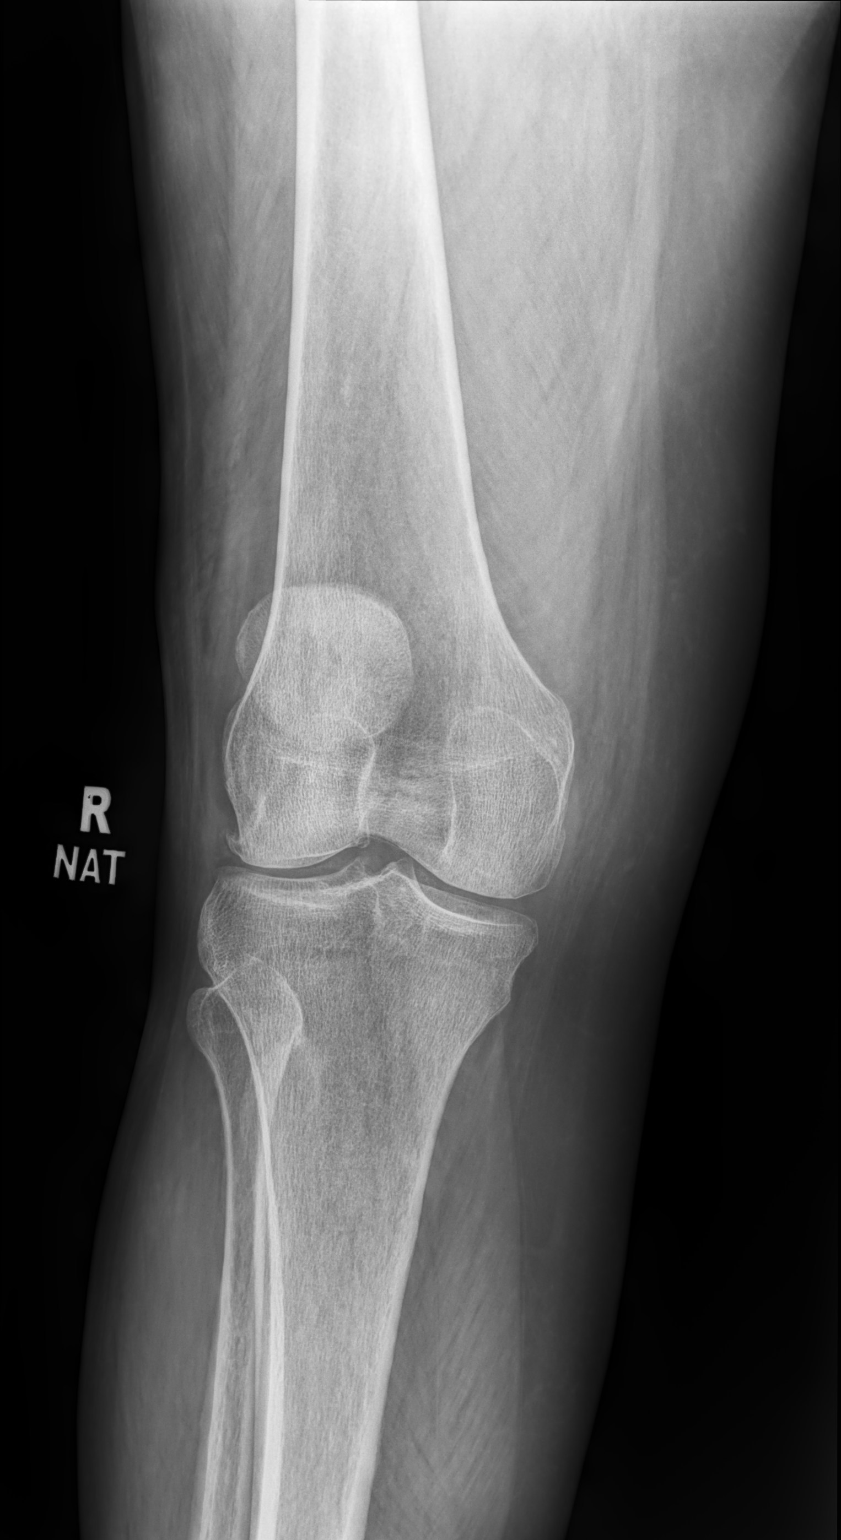

[knee mlo]
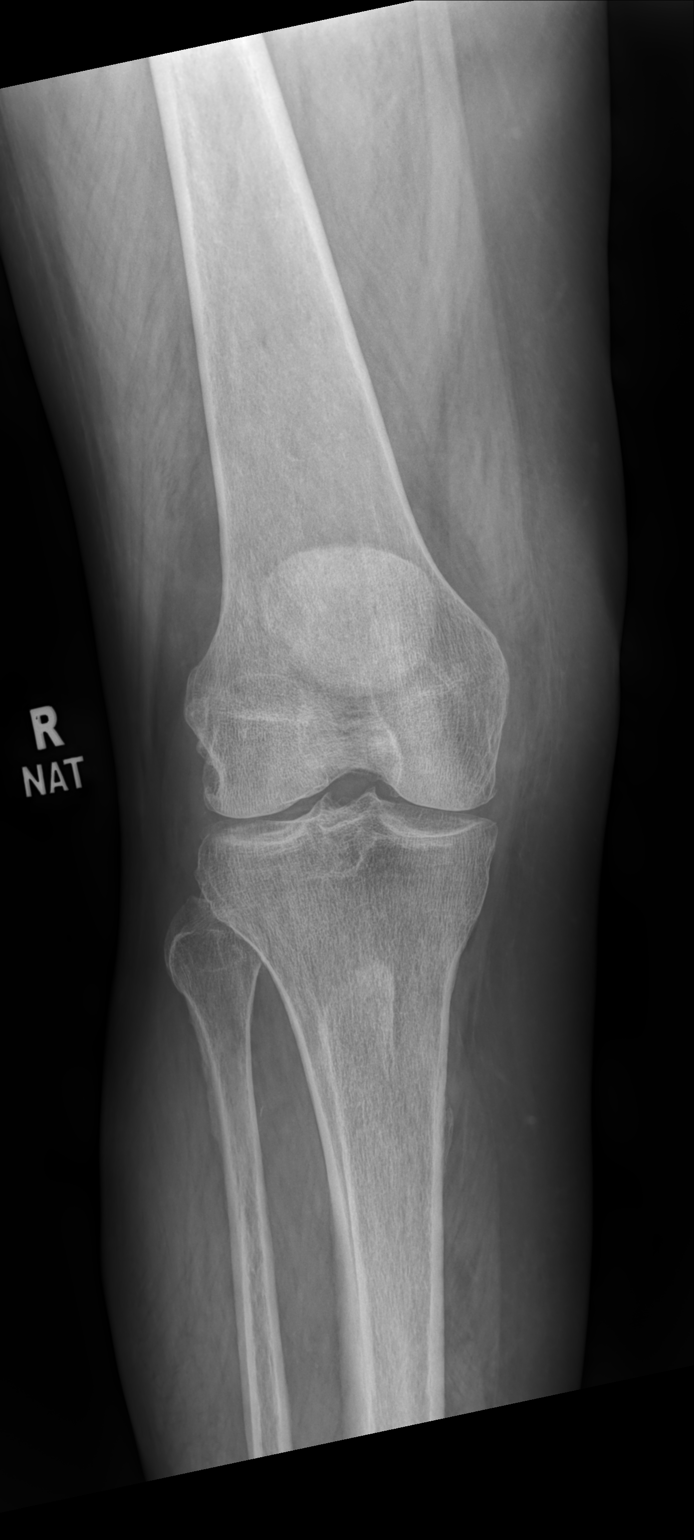

[knee lat]
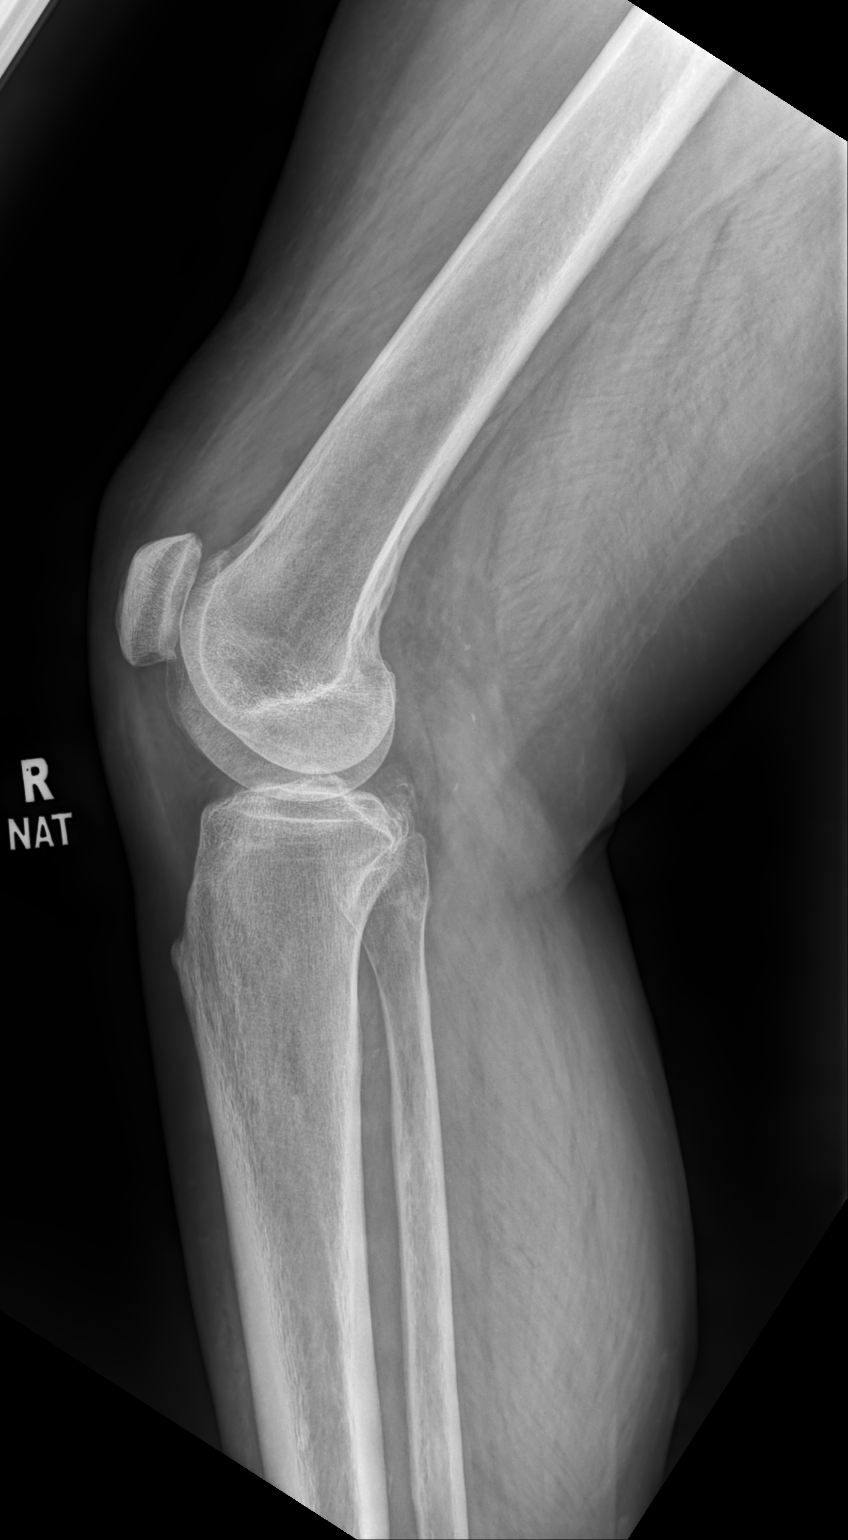

[4 of 4 positions shown; findings below may reference images not displayed]

FINDINGS: There is no evidence of displaced hip fracture or dislocation. There
is no evidence of arthropathy or other focal bone abnormality.

No fracture or dislocation of the right knee. Joint spaces are
preserved. Small, nonspecific knee joint effusion. Soft tissues are
unremarkable.

No fracture or dislocation of the right foot or ankle. Bunion
deformity of the right great toe with moderate associated first
metatarsophalangeal arthrosis and hammertoe deformities of the
remaining digits. Joint spaces are otherwise well preserved. Diffuse
soft tissue edema about the ankle.
IMPRESSION: 1. No evidence of displaced hip fracture or dislocation. Please note
that plain radiographs are significantly insensitive for hip and
pelvic fracture. Recommend CT or MRI to more sensitively evaluate
for fracture if clinically suspected.
2. No fracture or dislocation of the right knee. Small, nonspecific
knee joint effusion.
3. No fracture or dislocation of the right foot or ankle.
4. Diffuse soft tissue edema about the ankle.
5. Bunion deformity of the right great toe with moderate associated
first metatarsophalangeal arthrosis and hammertoe deformities of the
remaining digits.

## 2023-03-29 ENCOUNTER — Ambulatory Visit (INDEPENDENT_AMBULATORY_CARE_PROVIDER_SITE_OTHER): Payer: Medicare HMO | Admitting: Family Medicine

## 2023-03-29 ENCOUNTER — Other Ambulatory Visit (HOSPITAL_COMMUNITY)
Admission: RE | Admit: 2023-03-29 | Discharge: 2023-03-29 | Disposition: A | Discharge status: Home / Self Care | Payer: Medicare HMO | Source: Ambulatory Visit | Attending: Family Medicine | Admitting: Family Medicine

## 2023-03-29 ENCOUNTER — Encounter: Payer: Self-pay | Admitting: Family Medicine

## 2023-03-29 VITALS — BP 111/54 | HR 76 | Temp 99.7°F | Resp 16

## 2023-03-29 DIAGNOSIS — N898 Other specified noninflammatory disorders of vagina: Secondary | ICD-10-CM | POA: Insufficient documentation

## 2023-03-29 DIAGNOSIS — N760 Acute vaginitis: Secondary | ICD-10-CM

## 2023-03-29 DIAGNOSIS — Z23 Encounter for immunization: Secondary | ICD-10-CM | POA: Diagnosis not present

## 2023-03-29 DIAGNOSIS — Z7985 Long-term (current) use of injectable non-insulin antidiabetic drugs: Secondary | ICD-10-CM

## 2023-03-29 DIAGNOSIS — R300 Dysuria: Secondary | ICD-10-CM

## 2023-03-29 DIAGNOSIS — E1169 Type 2 diabetes mellitus with other specified complication: Secondary | ICD-10-CM

## 2023-03-29 DIAGNOSIS — Z122 Encounter for screening for malignant neoplasm of respiratory organs: Secondary | ICD-10-CM

## 2023-03-29 DIAGNOSIS — Z7984 Long term (current) use of oral hypoglycemic drugs: Secondary | ICD-10-CM | POA: Diagnosis not present

## 2023-03-29 MED ORDER — NITROFURANTOIN MONOHYD MACRO 100 MG PO CAPS: 100.0000 mg | ORAL_CAPSULE | Freq: Two times a day (BID) | ORAL | 0 refills | Status: DC

## 2023-03-29 MED ORDER — FLUCONAZOLE 150 MG PO TABS: 150.0000 mg | ORAL_TABLET | Freq: Once | ORAL | 0 refills | Status: AC

## 2023-03-29 NOTE — Progress Notes (Signed)
Flu and PNA vaccine Concerns with UTI. Frequent urination. Denies dysuria or pelvic discomfort. Vaginal irritation  Taking Doxy for right foot surgery.  Please send ATB to [facility] if needed.

## 2023-03-29 NOTE — Progress Notes (Signed)
Established Patient Office Visit  Subjective    Patient ID: Amy Garner, female    DOB: 1955/06/26  Age: 67 y.o. MRN: [id number]  CC:  Chief Complaint  Patient presents with   Urinary Tract Infection    HPI HAVAN BROONER presents for routine follow up of chronic med issues including diabetes. Patient also complains of dysuria. She denies fever/chills and has taken no meds for sx.   Outpatient Encounter Medications as of 03/29/2023  Medication Sig   albuterol (VENTOLIN HFA) 108 (90 Base) MCG/ACT inhaler Inhale 2 puffs into the lungs every 6 (six) hours as needed for wheezing or shortness of breath.   doxycycline (VIBRA-TABS) 100 MG tablet Take 1 tablet (100 mg total) by mouth 2 (two) times daily.   escitalopram (LEXAPRO) 20 MG tablet Take 1 tablet (20 mg total) by mouth daily.   esomeprazole (NEXIUM) 40 MG capsule TAKE 1 CAPSULE (40 MG TOTAL) BY MOUTH IN THE MORNING.   [EXPIRED] fluconazole (DIFLUCAN) 150 MG tablet Take 1 tablet (150 mg total) by mouth once for 1 dose.   FLUoxetine (PROZAC) 20 MG capsule Take 1 capsule (20 mg total) by mouth daily.   insulin glargine, 1 Unit Dial, (TOUJEO SOLOSTAR) 300 UNIT/ML Solostar Pen INJECT 62 UNITS INTO THE SKIN DAILY.   losartan (COZAAR) 25 MG tablet TAKE 1 TABLET EVERY DAY   lovastatin (MEVACOR) 10 MG tablet TAKE 1 TABLET AT BEDTIME   meclizine (ANTIVERT) 25 MG tablet Take 1 tablet (25 mg total) by mouth 3 (three) times daily as needed for dizziness.   metFORMIN (GLUCOPHAGE-XR) 500 MG 24 hr tablet TAKE 2 TABLETS twice daily with meals.   nitrofurantoin, macrocrystal-monohydrate, (MACROBID) 100 MG capsule Take 1 capsule (100 mg total) by mouth 2 (two) times daily.   Semaglutide, 2 MG/DOSE, (OZEMPIC, 2 MG/DOSE,) 8 MG/3ML SOPN INJECT 2MG  UNDER THE SKIN ONE TIME WEEKLY AS DIRECTED   triamterene-hydrochlorothiazide (DYAZIDE) 37.5-25 MG capsule TAKE 1 CAPSULE EVERY DAY   [DISCONTINUED] doxycycline (VIBRA-TABS) 100 MG tablet Take 1  tablet (100 mg total) by mouth 2 (two) times daily.   Alcohol Swabs PADS USE TO TAKE BLOOD SUGAR 2 TIMES A DAY   cholecalciferol (VITAMIN D3) 25 MCG (1000 UNIT) tablet Take 1,000 Units by mouth daily.   glucose blood (TRUE METRIX BLOOD GLUCOSE TEST) test strip Use as instructed   glucose blood test strip Use as instructed   ibuprofen (ADVIL) 800 MG tablet Take 1 tablet (800 mg total) by mouth every 6 (six) hours as needed. (Patient not taking: Reported on 03/29/2023)   Insulin Pen Needle (PEN NEEDLES) 32G X 4 MM MISC Use to inject insulin once daily.   Misc. Devices MISC TRUE MATRIX METER   TRUEplus Lancets 33G MISC USE TO TAKE BLOOD SUGAR 2 TIMES A DAY   [DISCONTINUED] oxyCODONE-acetaminophen (PERCOCET) 5-325 MG tablet Take 1 tablet by mouth every 4 (four) hours as needed for severe pain.   No facility-administered encounter medications on file as of 03/29/2023.    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    Peptic ulcer    Pneumonia  Type 2 diabetes mellitus [facility])     Past Surgical History:  Procedure Laterality Date   APPENDECTOMY  1990   BREAST BIOPSY     BUNIONECTOMY Right    CAPSULOTOMY METATARSOPHALANGEAL Right 02/07/2023   Procedure: CAPSULOTOMY METATARSOPHALANGEAL SECOND AND THIRD;  Surgeon: Candelaria Stagers, DPM;  Location: [facility];  Service: Podiatry;  Laterality: Right;   CERVICAL DISC SURGERY     CHOLECYSTECTOMY     COLONOSCOPY     DIAGNOSTIC LAPAROSCOPY  1980   endometriosis   HALLUX FUSION Right 02/07/2023   Procedure: HALLUX FUSION METATARSAL PHALANGEAL JOINT;  Surgeon: Candelaria Stagers, DPM;  Location: [facility];  Service: Podiatry;  Laterality: Right;   POPLITEAL BLOCK   HAMMER TOE SURGERY Right 02/07/2023   Procedure: HAMMER TOE CORRECTION SECOND AND THIRD;  Surgeon: Candelaria Stagers, DPM;  Location: [facility];  Service: Podiatry;  Laterality: Right;   HERNIA REPAIR  Inguinal right & left repair.   INGUINAL HERNIA REPAIR Left 05/02/2020   Procedure: OPEN LEFT INGUINAL HERNIA REPAIR WITH MESH, TAP BLOCK;  Surgeon: Berna Bue, MD;  Location: [facility];  Service: General;  Laterality: Left;   TUBAL LIGATION  1990s    Family History  Problem Relation Age of Onset   Heart attack Mother    Asthma Mother    Heart disease Father    Hypertension Father    Stomach cancer Paternal Aunt    Colon cancer Paternal Aunt    Varicose Veins Maternal Grandmother    Diabetes Paternal Grandmother    ADD / ADHD Daughter    ADD / ADHD Son     Social History   Socioeconomic History   Marital status: Widowed    Spouse name: Not on file   Number of children: Not on file   Years of education: Not on file   Highest education level: Associate degree: occupational, Scientist, product/process development, or vocational program  Occupational History   Not on file  Tobacco Use   Smoking status: Former    Current packs/day: 0.00    Average packs/day: 1.1 packs/day for 20.0 years (22.5 ttl pk-yrs)    Types: Cigarettes    Start date: 2002    Quit date: 2017    Years since quitting: 7.8   Smokeless tobacco: Never  Vaping Use   Vaping status: Never Used  Substance and Sexual Activity   Alcohol use: Never   Drug use: Never   Sexual activity: Yes    Birth control/protection: Surgical  Other Topics Concern   Not on file  Social History Narrative   Lives alone   Social Determinants of Health   Financial Resource Strain: High Risk (03/24/2023)   Overall Financial Resource Strain (CARDIA)    Difficulty of Paying Living Expenses: Very hard  Food Insecurity: Food Insecurity Present (03/24/2023)   Hunger Vital Sign    Worried About Running Out of Food in the Last Year: Sometimes  true    Ran Out of Food in the Last Year: Sometimes true  Transportation Needs: No Transportation Needs (03/24/2023)   PRAPARE - Administrator, Civil Service (Medical): No    Lack of Transportation (Non-Medical): No  Physical Activity: Inactive (03/24/2023)   Exercise Vital Sign    Days of Exercise per Week: 0 days    Minutes of Exercise per Session: 20 min  Stress: No Stress Concern Present (03/24/2023)   [employer] of Occupational Health - Occupational Stress Questionnaire    Feeling of Stress :  Only a little  Social Connections: Moderately Isolated (03/24/2023)   Social Connection and Isolation Panel [NHANES]    Frequency of Communication with Friends and Family: More than three times a week    Frequency of Social Gatherings with Friends and Family: Once a week    Attends Religious Services: Never    Database administrator or Organizations: Yes    Attends Banker Meetings: 1 to 4 times per year    Marital Status: Widowed  Intimate Partner Violence: Not At Risk (02/03/2023)   Humiliation, Afraid, Rape, and Kick questionnaire    Fear of Current or Ex-Partner: No    Emotionally Abused: No    Physically Abused: No    Sexually Abused: No    ROS      Objective    BP (!) 111/54 (BP Location: Right Arm, Patient Position: Sitting, Cuff Size: Large)   Pulse 76   Temp 99.7 F (37.6 C) (Oral)   Resp 16   SpO2 96%   Physical Exam Vitals and nursing note reviewed.  Constitutional:      General: She is not in acute distress. Cardiovascular:     Rate and Rhythm: Normal rate and regular rhythm.  Pulmonary:     Effort: Pulmonary effort is normal.     Breath sounds: Normal breath sounds.  Abdominal:     Palpations: Abdomen is soft.     Tenderness: There is no abdominal tenderness.  Neurological:     General: No focal deficit present.     Mental Status: She is alert and oriented to person, place, and time.         Assessment & Plan:   Type 2  diabetes mellitus with other specified complication, without long-term current use of insulin (HCC) -     Microalbumin / creatinine urine ratio  Vaginal irritation -     Cervicovaginal ancillary only  Dysuria -     POCT URINALYSIS DIP (CLINITEK)  Vaginitis and vulvovaginitis  Encounter for screening for lung cancer -     Ambulatory Referral for Lung Cancer Scre  Encounter for immunization -     Flu Vaccine Trivalent High Dose (Fluad) -     PNEUMOCOCCAL CONJUGATE VACCINE 15-VALENT  Other orders -     Fluconazole; Take 1 tablet (150 mg total) by mouth once for 1 dose.  Dispense: 1 tablet; Refill: 0 -     Nitrofurantoin Monohyd Macro; Take 1 capsule (100 mg total) by mouth 2 (two) times daily.  Dispense: 10 capsule; Refill: 0     Return if symptoms worsen or fail to improve.   Tommie Raymond, MD

## 2023-03-30 LAB — CERVICOVAGINAL ANCILLARY ONLY
Bacterial Vaginitis (gardnerella): NEGATIVE
Candida Glabrata: POSITIVE — AB
Candida Vaginitis: POSITIVE — AB
Chlamydia: NEGATIVE
Comment: NEGATIVE
Comment: NEGATIVE
Comment: NEGATIVE
Comment: NEGATIVE
Comment: NEGATIVE
Comment: NORMAL
Neisseria Gonorrhea: NEGATIVE
Trichomonas: NEGATIVE

## 2023-03-30 LAB — MICROALBUMIN / CREATININE URINE RATIO
Creatinine, Urine: 237.2 mg/dL
Microalb/Creat Ratio: 6 mg/g{creat} (ref 0–29)
Microalbumin, Urine: 13.2 ug/mL

## 2023-04-04 ENCOUNTER — Encounter: Payer: Self-pay | Admitting: Family Medicine

## 2023-04-13 ENCOUNTER — Encounter: Payer: Self-pay | Admitting: Podiatry

## 2023-04-13 ENCOUNTER — Ambulatory Visit: Payer: Medicare HMO | Admitting: Podiatry

## 2023-04-13 DIAGNOSIS — M205X1 Other deformities of toe(s) (acquired), right foot: Secondary | ICD-10-CM | POA: Diagnosis not present

## 2023-04-13 NOTE — Progress Notes (Signed)
  Subjective:  Patient ID: [name], female    DOB: May 22, 1955,  MRN: [id number]  Chief Complaint  Patient presents with   Routine Post Op    PATIENT STATES HER RIGHT FOOT HALLUX HAS BUSTED OPEN ,NO MEDICATION FOR PAIN , PATIENT STATES SHE IS NOT IN ANY PAIN.   67 y.o. female returns today for planned flexor tenotomy of the right first and second digit.  Objective:  There were no vitals filed for this visit.  General AA&O x3. Normal mood and affect.  Vascular Pedal pulses palpable.  Neurologic Epicritic sensation grossly intact.  Dermatologic Pre-ulcerative callus at the tip of the right, hallux, 2nd toe  Orthopedic: Semi-reducible hammertoe deformity right, hallux, 2nd toe    Assessment & Plan:  Patient was evaluated and treated and all questions answered.  Hammertoe right first and second with pre-ulcerative callus -Flexor tenotomy as below. -Advised to remove the dressing in 24 hours and apply a band-aid and triple abx ointment every day thereafter.  Procedure: Flexor Tenotomy Indication for Procedure: toe with semi-reducible hammertoe with distal tip ulceration. Flexor tenotomy indicated to alleviate contracture, reduce pressure, and enhance healing of the ulceration. Location: right, hallux, 2nd toe Anesthesia: Lidocaine 1% plain; 1.5 mL and Marcaine 0.5% plain; 1.5 mL digital block Instrumentation: 18 g needle  Technique: The toe was anesthetized as above and prepped in the usual fashion. The toe was exsanquinated and a tourniquet was secured at the base of the toe. An 18g needle was then used to percutaneously release the flexor tendon at the plantar surface of the toe with noted release of the hammertoe deformity. The incision was then dressed with antibiotic ointment and band-aid. Compression splint dressing applied. Patient tolerated the procedure well. Dressing: Dry, sterile, compression dressing. Disposition: Patient tolerated procedure well. Patient to return in  1 week for follow-up.      No follow-ups on file.

## 2023-04-21 ENCOUNTER — Telehealth: Payer: Self-pay

## 2023-04-21 ENCOUNTER — Other Ambulatory Visit: Payer: Self-pay | Admitting: Family Medicine

## 2023-04-21 NOTE — Telephone Encounter (Signed)
Patient called - needs a refill of doxycycline? She takes her last one tonight - she has a diabetic ulcer -please advise -thanks

## 2023-04-22 MED ORDER — DOXYCYCLINE HYCLATE 100 MG PO TABS: 100.0000 mg | ORAL_TABLET | Freq: Two times a day (BID) | ORAL | 0 refills | Status: DC

## 2023-04-22 NOTE — Addendum Note (Signed)
Addended by: Nicholes Rough on: 04/22/2023 08:55 AM   Modules accepted: Orders

## 2023-04-25 ENCOUNTER — Other Ambulatory Visit (HOSPITAL_COMMUNITY): Payer: Self-pay

## 2023-04-28 ENCOUNTER — Encounter: Payer: Self-pay | Admitting: Podiatry

## 2023-04-28 ENCOUNTER — Ambulatory Visit: Payer: Medicare HMO | Admitting: Podiatry

## 2023-04-28 VITALS — Ht 66.0 in | Wt 229.0 lb

## 2023-04-28 DIAGNOSIS — M205X1 Other deformities of toe(s) (acquired), right foot: Secondary | ICD-10-CM | POA: Diagnosis not present

## 2023-04-28 NOTE — Progress Notes (Signed)
  Subjective:  Patient ID: Amy Garner, female    DOB: 1955-11-17,  MRN: [id number]  Chief Complaint  Patient presents with   Routine Post Op    Pt is here for routine post op visit states her foot is feeling ok,    67 y.o. female returns today for follow-up for flexor tenotomy of right first and second digit.  She states is doing better.  She is keeping it evaluated denies any other acute complaints  Objective:  There were no vitals filed for this visit.  General AA&O x3. Normal mood and affect.  Vascular Pedal pulses palpable.  Neurologic Epicritic sensation grossly intact.  Dermatologic No further pre-ulcerative callus at the tip of the right, hallux, 2nd toe  Orthopedic: No further semi-reducible hammertoe deformity right, hallux, 2nd toe    Assessment & Plan:  Patient was evaluated and treated and all questions answered.  Hammertoe right first and second with pre-ulcerative callus -Clinically healed and doing much better.  Sutures were removed.  Reduction of deformity noted.  Will continue to clinically monitor      No follow-ups on file.

## 2023-06-08 ENCOUNTER — Ambulatory Visit: Payer: Medicare HMO | Admitting: Podiatry

## 2023-06-08 ENCOUNTER — Other Ambulatory Visit: Payer: Self-pay | Admitting: Family Medicine

## 2023-06-10 MED ORDER — MECLIZINE HCL 25 MG PO TABS: 25.0000 mg | ORAL_TABLET | Freq: Three times a day (TID) | ORAL | 0 refills | Status: DC | PRN

## 2023-06-10 MED ORDER — FLUOXETINE HCL 20 MG PO CAPS: 20.0000 mg | ORAL_CAPSULE | Freq: Every day | ORAL | 0 refills | Status: DC

## 2023-06-10 MED ORDER — ESCITALOPRAM OXALATE 20 MG PO TABS: 20.0000 mg | ORAL_TABLET | Freq: Every day | ORAL | 0 refills | Status: DC

## 2023-06-14 ENCOUNTER — Telehealth: Payer: Self-pay | Admitting: *Deleted

## 2023-06-14 NOTE — Telephone Encounter (Signed)
Medication refill

## 2023-06-15 ENCOUNTER — Encounter: Payer: Self-pay | Admitting: Podiatry

## 2023-06-15 ENCOUNTER — Ambulatory Visit: Payer: Medicare HMO | Admitting: Podiatry

## 2023-06-15 DIAGNOSIS — Z01818 Encounter for other preprocedural examination: Secondary | ICD-10-CM

## 2023-06-15 DIAGNOSIS — M2031 Hallux varus (acquired), right foot: Secondary | ICD-10-CM

## 2023-06-15 DIAGNOSIS — M19071 Primary osteoarthritis, right ankle and foot: Secondary | ICD-10-CM

## 2023-06-15 DIAGNOSIS — M205X1 Other deformities of toe(s) (acquired), right foot: Secondary | ICD-10-CM

## 2023-06-15 NOTE — Progress Notes (Unsigned)
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

## 2023-06-27 ENCOUNTER — Other Ambulatory Visit: Payer: Self-pay | Admitting: Family Medicine

## 2023-06-28 ENCOUNTER — Telehealth: Payer: Self-pay | Admitting: Podiatry

## 2023-06-28 NOTE — Telephone Encounter (Signed)
DOS-08/01/23  HALLUX IPJ FUSION [id number] TENOTOMY [id number]  [employer] EFFECTIVE DATE 07/15/21  PER THE COHERE PORTAL,PRIOR AUTH IS NOT REQUIRED FOR CPT CODES 60630 AND 28010, GOOD FROM 08/01/23 - 10/30/23.  AUTH Tracking [id number]

## 2023-06-29 ENCOUNTER — Other Ambulatory Visit: Payer: Self-pay | Admitting: Family Medicine

## 2023-07-01 IMAGING — DX DG CHEST 2V
2 series · 2 of 2 positions shown · non-contrast
Comparison: None.

CLINICAL DATA: Cough

EXAM:
CHEST - 2 VIEW

[chest pa]
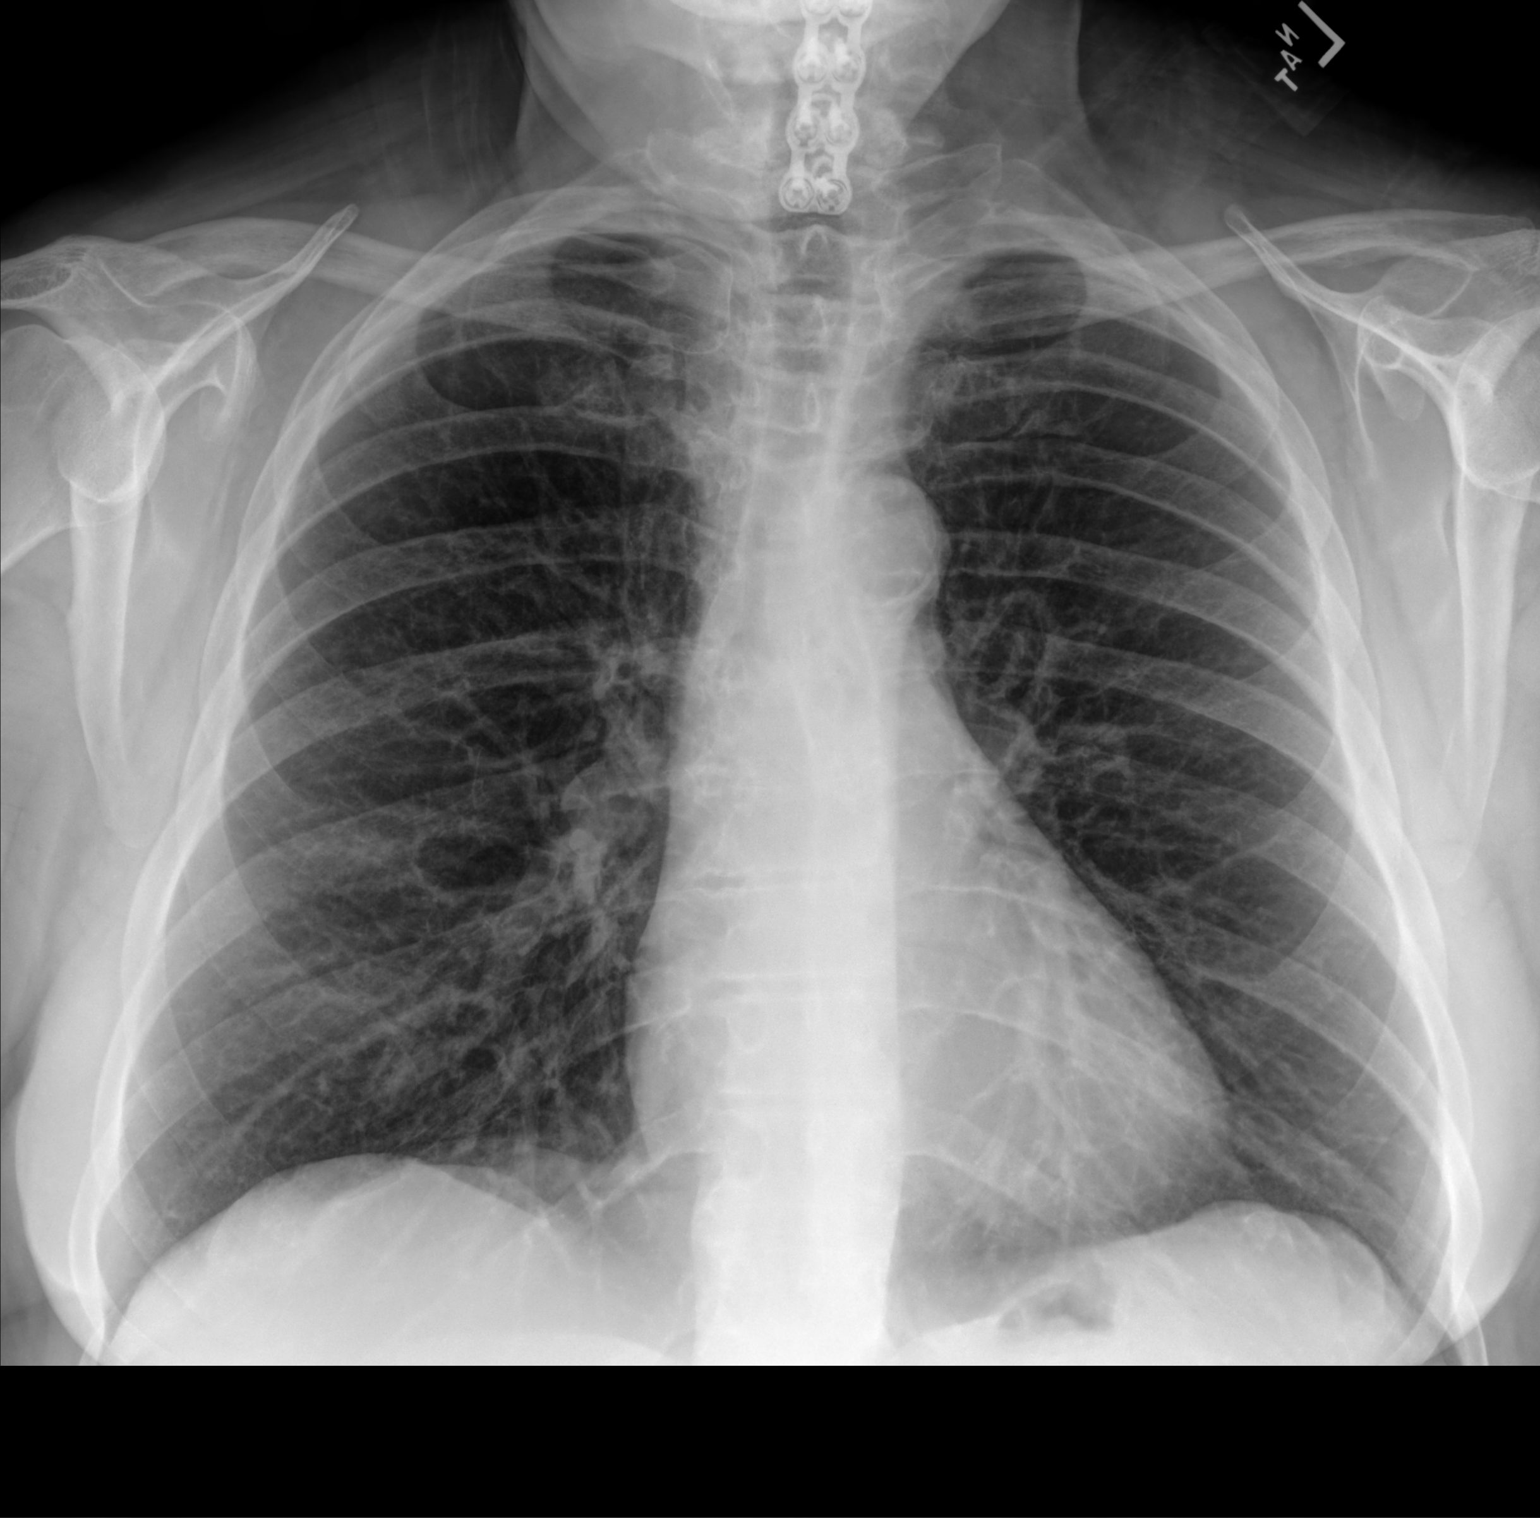

[chest lat]
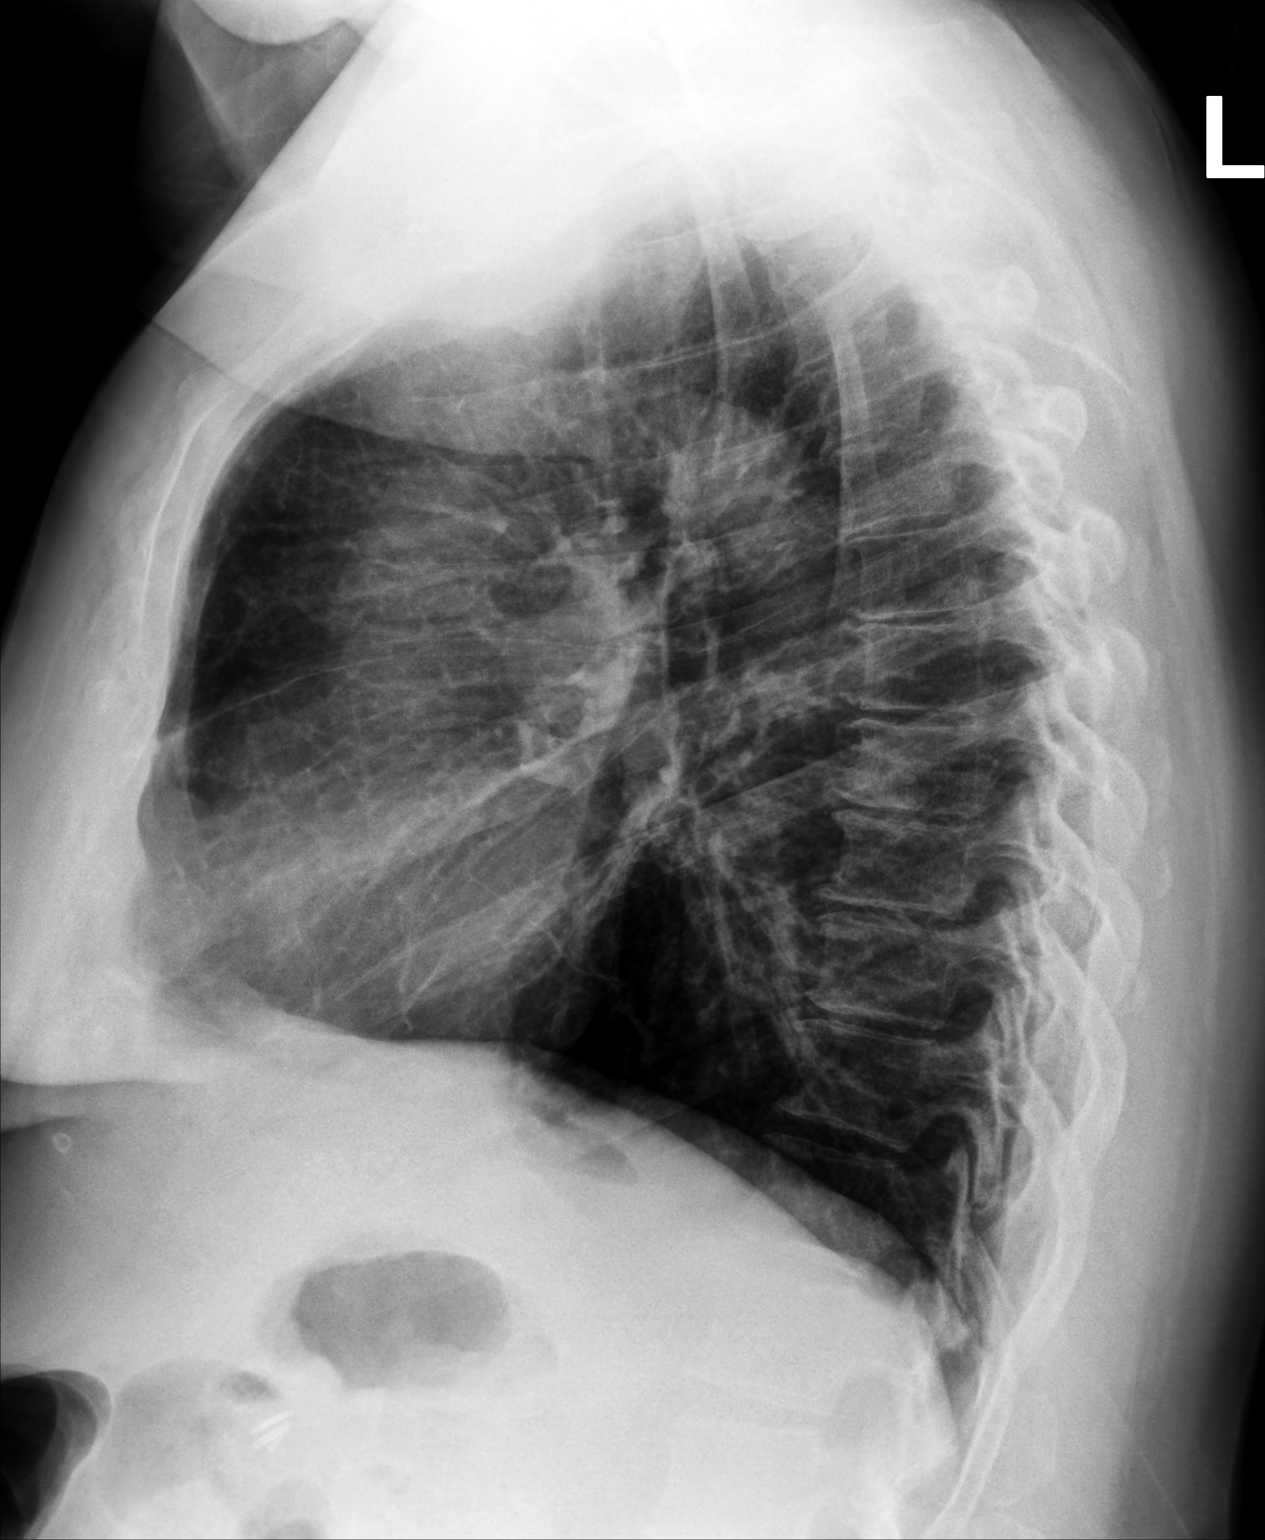

[2 of 2 positions shown; findings below may reference images not displayed]

FINDINGS: Cardiac silhouette and mediastinal contours are within normal
limits. Mild to moderate calcification within aortic arch. The lungs
are clear. No pleural effusion or pneumothorax. ACDF hardware
overlies the lower cervical spine. Mild-to-moderate multilevel
degenerative disc changes of the thoracic spine. Cholecystectomy
clips.
IMPRESSION: No active cardiopulmonary disease.

## 2023-07-12 ENCOUNTER — Other Ambulatory Visit: Payer: Self-pay | Admitting: Family Medicine

## 2023-07-12 NOTE — Telephone Encounter (Signed)
 Copied from CRM [id number]. Topic: Clinical - Medication Refill >> Jul 12, 2023  3:56 PM Dyann Kief wrote: Most Recent Primary Care Visit:  Provider: Georganna Skeans  Department: [facility]  Visit Type: OFFICE VISIT  Date: 03/29/2023  Medication: Prozac 20 mg and Lexapro  Has the patient contacted their pharmacy? Yes (Agent: If no, request that the patient contact the pharmacy for the refill. If patient does not wish to contact the pharmacy document the reason why and proceed with request.) (Agent: If yes, when and what did the pharmacy advise?)  Is this the correct pharmacy for this prescription? Yes If no, delete pharmacy and type the correct one.  This is the patient's preferred pharmacy:  [facility] [address] ([address] [place] - [address] [place] ([place] [address] Phone: [phone] Fax: [phone]  Has the prescription been filled recently? No  Is the patient out of the medication? Yes  Has the patient been seen for an appointment in the last year OR does the patient have an upcoming appointment? Yes  Can we respond through MyChart? Yes  Agent: Please be advised that Rx refills may take up to 3 business days. We ask that you follow-up with your pharmacy.

## 2023-07-21 ENCOUNTER — Ambulatory Visit: Payer: Medicare HMO | Admitting: Family Medicine

## 2023-07-21 ENCOUNTER — Encounter: Payer: Self-pay | Admitting: Family Medicine

## 2023-07-21 VITALS — BP 150/83 | HR 69 | Temp 98.3°F | Resp 18 | Ht 66.0 in | Wt 246.0 lb

## 2023-07-21 DIAGNOSIS — F32A Depression, unspecified: Secondary | ICD-10-CM | POA: Diagnosis not present

## 2023-07-21 DIAGNOSIS — Z794 Long term (current) use of insulin: Secondary | ICD-10-CM | POA: Diagnosis not present

## 2023-07-21 DIAGNOSIS — Z7985 Long-term (current) use of injectable non-insulin antidiabetic drugs: Secondary | ICD-10-CM

## 2023-07-21 DIAGNOSIS — I10 Essential (primary) hypertension: Secondary | ICD-10-CM

## 2023-07-21 DIAGNOSIS — Z7984 Long term (current) use of oral hypoglycemic drugs: Secondary | ICD-10-CM | POA: Diagnosis not present

## 2023-07-21 DIAGNOSIS — E1169 Type 2 diabetes mellitus with other specified complication: Secondary | ICD-10-CM | POA: Diagnosis not present

## 2023-07-21 DIAGNOSIS — E119 Type 2 diabetes mellitus without complications: Secondary | ICD-10-CM

## 2023-07-21 DIAGNOSIS — E785 Hyperlipidemia, unspecified: Secondary | ICD-10-CM

## 2023-07-21 DIAGNOSIS — F419 Anxiety disorder, unspecified: Secondary | ICD-10-CM | POA: Diagnosis not present

## 2023-07-21 LAB — POCT GLYCOSYLATED HEMOGLOBIN (HGB A1C): Hemoglobin A1C: 12.5 % — AB (ref 4.0–5.6)

## 2023-07-21 MED ORDER — FLUOXETINE HCL 20 MG PO CAPS: 20.0000 mg | ORAL_CAPSULE | Freq: Every day | ORAL | 1 refills | Status: DC

## 2023-07-25 ENCOUNTER — Encounter: Payer: Self-pay | Admitting: Family Medicine

## 2023-07-25 NOTE — Progress Notes (Signed)
 Established Patient Office Visit  Subjective    Patient ID: Amy Garner, female    DOB: 02/11/1956  Age: 68 y.o. MRN: [id number]  CC:  Chief Complaint  Patient presents with   Follow-up    6 month    HPI Amy Garner presents for follow up of chronic med issues including diabetes and hypertension. Patient also reports that she has had increased social stressors and that she is feeling depressed.   Outpatient Encounter Medications as of 07/21/2023  Medication Sig   albuterol (VENTOLIN HFA) 108 (90 Base) MCG/ACT inhaler Inhale 2 puffs into the lungs every 6 (six) hours as needed for wheezing or shortness of breath.   Alcohol Swabs PADS USE TO TAKE BLOOD SUGAR 2 TIMES A DAY   cholecalciferol (VITAMIN D3) 25 MCG (1000 UNIT) tablet Take 1,000 Units by mouth daily.   doxycycline (VIBRA-TABS) 100 MG tablet Take 1 tablet (100 mg total) by mouth 2 (two) times daily.   esomeprazole (NEXIUM) 40 MG capsule TAKE 1 CAPSULE (40 MG TOTAL) BY MOUTH IN THE MORNING.   glucose blood (TRUE METRIX BLOOD GLUCOSE TEST) test strip Use as instructed   glucose blood test strip Use as instructed   ibuprofen (ADVIL) 800 MG tablet Take 1 tablet (800 mg total) by mouth every 6 (six) hours as needed.   insulin glargine, 1 Unit Dial, (TOUJEO SOLOSTAR) 300 UNIT/ML Solostar Pen INJECT 62 UNITS INTO THE SKIN DAILY.   Insulin Pen Needle (PEN NEEDLES) 32G X 4 MM MISC Use to inject insulin once daily.   losartan (COZAAR) 25 MG tablet TAKE 1 TABLET EVERY DAY   lovastatin (MEVACOR) 10 MG tablet TAKE 1 TABLET AT BEDTIME   meclizine (ANTIVERT) 25 MG tablet TAKE 1 TABLET (25 MG TOTAL) BY MOUTH 3 (THREE) TIMES DAILY AS NEEDED FOR DIZZINESS.   metFORMIN (GLUCOPHAGE-XR) 500 MG 24 hr tablet TAKE 2 TABLETS twice daily with meals.   Misc. Devices MISC TRUE MATRIX METER   nitrofurantoin, macrocrystal-monohydrate, (MACROBID) 100 MG capsule Take 1 capsule (100 mg total) by mouth 2 (two) times daily.    triamterene-hydrochlorothiazide (DYAZIDE) 37.5-25 MG capsule TAKE 1 CAPSULE EVERY DAY   TRUEplus Lancets 33G MISC USE TO TAKE BLOOD SUGAR 2 TIMES A DAY   [DISCONTINUED] escitalopram (LEXAPRO) 20 MG tablet Take 1 tablet (20 mg total) by mouth daily.   [DISCONTINUED] FLUoxetine (PROZAC) 20 MG capsule Take 1 capsule (20 mg total) by mouth daily.   doxycycline (VIBRA-TABS) 100 MG tablet Take 1 tablet (100 mg total) by mouth 2 (two) times daily.   FLUoxetine (PROZAC) 20 MG capsule Take 1 capsule (20 mg total) by mouth daily.   Semaglutide, 2 MG/DOSE, (OZEMPIC, 2 MG/DOSE,) 8 MG/3ML SOPN INJECT 2MG  UNDER THE SKIN ONE TIME WEEKLY AS DIRECTED (Patient not taking: Reported on 07/21/2023)   No facility-administered encounter medications on file as of 07/21/2023.    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    Peptic ulcer    Pneumonia    Type 2 diabetes mellitus (HCC)     Past Surgical History:  Procedure Laterality Date   APPENDECTOMY  1990   BREAST BIOPSY     BUNIONECTOMY Right    CAPSULOTOMY METATARSOPHALANGEAL Right 02/07/2023   Procedure: CAPSULOTOMY METATARSOPHALANGEAL SECOND AND THIRD;  Surgeon: Candelaria Stagers, DPM;  Location: [facility];  Service: Podiatry;  Laterality: Right;   CERVICAL DISC SURGERY     CHOLECYSTECTOMY     COLONOSCOPY     DIAGNOSTIC LAPAROSCOPY  1980   endometriosis   HALLUX FUSION Right 02/07/2023   Procedure: HALLUX FUSION METATARSAL PHALANGEAL JOINT;  Surgeon: Candelaria Stagers, DPM;  Location: [facility];  Service: Podiatry;  Laterality: Right;  POPLITEAL BLOCK   HAMMER TOE SURGERY Right 02/07/2023   Procedure: HAMMER TOE CORRECTION SECOND AND THIRD;  Surgeon:  Candelaria Stagers, DPM;  Location: [facility];  Service: Podiatry;  Laterality: Right;   HERNIA REPAIR  Inguinal right & left repair.   INGUINAL HERNIA REPAIR Left 05/02/2020   Procedure: OPEN LEFT INGUINAL HERNIA REPAIR WITH MESH, TAP BLOCK;  Surgeon: Berna Bue, MD;  Location: [facility];  Service: General;  Laterality: Left;   TUBAL LIGATION  1990s    Family History  Problem Relation Age of Onset   Heart attack Mother    Asthma Mother    Heart disease Father    Hypertension Father    Stomach cancer Paternal Aunt    Colon cancer Paternal Aunt    Varicose Veins Maternal Grandmother    Diabetes Paternal Grandmother    ADD / ADHD Daughter    ADD / ADHD Son     Social History   Socioeconomic History   Marital status: Widowed    Spouse name: Not on file   Number of children: Not on file   Years of education: Not on file   Highest education level: Some college, no degree  Occupational History   Not on file  Tobacco Use   Smoking status: Former    Current packs/day: 0.00    Average packs/day: 1.1 packs/day for 20.0 years (22.5 ttl pk-yrs)    Types: Cigarettes    Start date: 2002    Quit date: 2017    Years since quitting: 8.1   Smokeless tobacco: Never  Vaping Use   Vaping status: Never Used  Substance and Sexual Activity   Alcohol use: Never   Drug use: Never   Sexual activity: Yes    Birth control/protection: Surgical  Other Topics Concern   Not on file  Social History Narrative   Lives alone   Social Drivers of Health   Financial Resource Strain: Low Risk  (07/17/2023)   Overall Financial Resource Strain (CARDIA)    Difficulty of Paying Living Expenses: Not very hard  Food Insecurity: Food Insecurity Present (07/17/2023)   Hunger Vital Sign    Worried About Running Out of Food in the Last Year: Sometimes true    Ran Out of Food in the Last Year: Never true  Transportation Needs: No Transportation Needs (07/17/2023)   PRAPARE - Scientist, research (physical sciences) (Medical): No    Lack of Transportation (Non-Medical): No  Physical Activity: Insufficiently Active (07/17/2023)   Exercise Vital Sign    Days of Exercise per Week: 1 day    Minutes of Exercise per Session: 20 min  Stress: No Stress Concern Present (07/17/2023)   [employer] of Occupational Health - Occupational Stress Questionnaire    Feeling of Stress : Not at all  Social Connections: Moderately Isolated (07/17/2023)   Social Connection and Isolation Panel [NHANES]  Frequency of Communication with Friends and Family: More than three times a week    Frequency of Social Gatherings with Friends and Family: Once a week    Attends Religious Services: Never    Database administrator or Organizations: No    Attends Banker Meetings: 1 to 4 times per year    Marital Status: Widowed  Intimate Partner Violence: Not At Risk (02/03/2023)   Humiliation, Afraid, Rape, and Kick questionnaire    Fear of Current or Ex-Partner: No    Emotionally Abused: No    Physically Abused: No    Sexually Abused: No    Review of Systems  Psychiatric/Behavioral:  Positive for depression. Negative for suicidal ideas. The patient is nervous/anxious.   All other systems reviewed and are negative.       Objective    BP (!) 150/83   Pulse 69   Temp 98.3 F (36.8 C) (Oral)   Resp 18   Ht 5\' 6"  (1.676 m)   Wt 246 lb (111.6 kg)   SpO2 95%   BMI 39.71 kg/m   Physical Exam Vitals and nursing note reviewed.  Constitutional:      General: She is not in acute distress. Cardiovascular:     Rate and Rhythm: Normal rate and regular rhythm.  Pulmonary:     Effort: Pulmonary effort is normal.     Breath sounds: Normal breath sounds.  Abdominal:     Palpations: Abdomen is soft.     Tenderness: There is no abdominal tenderness.  Neurological:     General: No focal deficit present.     Mental Status: She is alert and oriented to person, place, and time.  Psychiatric:         Mood and Affect: Mood is anxious and depressed.        Speech: Speech normal.        Behavior: Behavior normal.         Assessment & Plan:   1. Type 2 diabetes mellitus with other specified complication, without long-term current use of insulin (HCC) (Primary) Greatly increased A1c.discussed compliance. Luke for meds management.  - HgB A1c  2. Essential hypertension Slightly elevated readings. Compliance discussed.   3. Hyperlipidemia, unspecified hyperlipidemia type Continue   4. Anxiety and depression Prozac 20 mg prescribed.    5. Diabetes mellitus treated with oral medication (HCC)   6. Encounter for long-term (current) use of insulin (HCC)   7. Long-term current use of injectable noninsulin antidiabetic medication    Return in about 4 weeks (around 08/18/2023) for physical.   Tommie Raymond, MD

## 2023-08-01 ENCOUNTER — Encounter: Payer: Self-pay | Admitting: Podiatry

## 2023-08-01 ENCOUNTER — Other Ambulatory Visit: Payer: Self-pay | Admitting: Podiatry

## 2023-08-01 DIAGNOSIS — M12571 Traumatic arthropathy, right ankle and foot: Secondary | ICD-10-CM | POA: Diagnosis not present

## 2023-08-01 DIAGNOSIS — M205X1 Other deformities of toe(s) (acquired), right foot: Secondary | ICD-10-CM | POA: Diagnosis not present

## 2023-08-01 MED ORDER — OXYCODONE-ACETAMINOPHEN 5-325 MG PO TABS: 1.0000 | ORAL_TABLET | ORAL | 0 refills | Status: DC | PRN

## 2023-08-01 MED ORDER — IBUPROFEN 800 MG PO TABS: 800.0000 mg | ORAL_TABLET | Freq: Four times a day (QID) | ORAL | 1 refills | Status: DC | PRN

## 2023-08-10 ENCOUNTER — Ambulatory Visit (INDEPENDENT_AMBULATORY_CARE_PROVIDER_SITE_OTHER)

## 2023-08-10 ENCOUNTER — Ambulatory Visit (INDEPENDENT_AMBULATORY_CARE_PROVIDER_SITE_OTHER): Payer: Medicare HMO | Admitting: Podiatry

## 2023-08-10 DIAGNOSIS — Z9889 Other specified postprocedural states: Secondary | ICD-10-CM

## 2023-08-10 DIAGNOSIS — M205X1 Other deformities of toe(s) (acquired), right foot: Secondary | ICD-10-CM

## 2023-08-10 NOTE — Progress Notes (Signed)
 Subjective:  Patient ID: Amy Garner, female    DOB: 27-Nov-1955,  MRN: [id number]  Chief Complaint  Patient presents with   Routine Post Op    DOS 08/01/23 --- RIGHT HALLUX FLEXOR TENOTOMY VERSUS RIGHT HALLUX IPJ FUSION WITH KWIRE    DOS: 08/01/2023 Procedure: Right hallux IPJ fusion  68 y.o. female returns for post-op check.  Patient states that she is doing well minimal pain.  No acute complaints bandages clean dry and intact  Review of Systems: Negative except as noted in the HPI. Denies N/V/F/Ch.  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    Peptic ulcer    Pneumonia    Type 2 diabetes mellitus (HCC)     Current Outpatient Medications:    albuterol (VENTOLIN HFA) 108 (90 Base) MCG/ACT inhaler, Inhale 2 puffs into the lungs every 6 (six) hours as needed for wheezing or shortness of breath., Disp: 18 g, Rfl: 5   Alcohol Swabs PADS, USE TO TAKE BLOOD SUGAR 2 TIMES A DAY, Disp: 120 each, Rfl: 2   cholecalciferol (VITAMIN D3) 25 MCG (1000 UNIT) tablet, Take 1,000 Units by mouth daily., Disp: , Rfl:    doxycycline (VIBRA-TABS) 100 MG tablet, Take 1 tablet (100 mg total) by mouth 2 (two) times daily., Disp: 20 tablet, Rfl: 0   esomeprazole (NEXIUM) 40 MG capsule, TAKE 1 CAPSULE (40 MG TOTAL) BY MOUTH IN THE MORNING., Disp: 90 capsule, Rfl: 3   FLUoxetine (PROZAC) 20 MG capsule, Take 1 capsule (20 mg total) by mouth daily., Disp: 90 capsule, Rfl: 1   glucose blood (TRUE METRIX BLOOD GLUCOSE TEST) test strip, Use as instructed, Disp: 100 each, Rfl: 12   glucose blood test strip, Use as instructed, Disp: 100 each, Rfl: 5   ibuprofen (ADVIL) 800 MG tablet, Take 1 tablet  (800 mg total) by mouth every 6 (six) hours as needed., Disp: 60 tablet, Rfl: 1   ibuprofen (ADVIL) 800 MG tablet, Take 1 tablet (800 mg total) by mouth every 6 (six) hours as needed., Disp: 60 tablet, Rfl: 1   insulin glargine, 1 Unit Dial, (TOUJEO SOLOSTAR) 300 UNIT/ML Solostar Pen, INJECT 62 UNITS INTO THE SKIN DAILY., Disp: 4.5 mL, Rfl: 3   Insulin Pen Needle (PEN NEEDLES) 32G X 4 MM MISC, Use to inject insulin once daily., Disp: 100 each, Rfl: 3   losartan (COZAAR) 25 MG tablet, TAKE 1 TABLET EVERY DAY, Disp: 90 tablet, Rfl: 3   lovastatin (MEVACOR) 10 MG tablet, TAKE 1 TABLET AT BEDTIME, Disp: 90 tablet, Rfl: 3   meclizine (ANTIVERT) 25 MG tablet, TAKE 1 TABLET (25 MG TOTAL) BY MOUTH 3 (THREE) TIMES DAILY AS NEEDED FOR DIZZINESS., Disp: 30 tablet, Rfl: 0   metFORMIN (GLUCOPHAGE-XR) 500 MG 24 hr tablet, TAKE 2 TABLETS twice daily with meals., Disp: 360 tablet, Rfl: 2   Misc. Devices MISC, TRUE MATRIX METER, Disp: 1 each, Rfl: 0   nitrofurantoin, macrocrystal-monohydrate, (MACROBID) 100 MG capsule, Take 1 capsule (100 mg total) by mouth 2 (two) times daily., Disp: 10 capsule, Rfl: 0   oxyCODONE-acetaminophen (PERCOCET) 5-325 MG tablet, Take 1 tablet by mouth  every 4 (four) hours as needed for severe pain (pain score 7-10)., Disp: 30 tablet, Rfl: 0   Semaglutide, 2 MG/DOSE, (OZEMPIC, 2 MG/DOSE,) 8 MG/3ML SOPN, INJECT 2MG  UNDER THE SKIN ONE TIME WEEKLY AS DIRECTED (Patient not taking: Reported on 07/21/2023), Disp: 9 mL, Rfl: 1   triamterene-hydrochlorothiazide (DYAZIDE) 37.5-25 MG capsule, TAKE 1 CAPSULE EVERY DAY, Disp: 90 capsule, Rfl: 3   TRUEplus Lancets 33G MISC, USE TO TAKE BLOOD SUGAR 2 TIMES A DAY, Disp: 100 each, Rfl: 2  Social History   Tobacco Use  Smoking Status Former   Current packs/day: 0.00   Average packs/day: 1.1 packs/day for 20.0 years (22.5 ttl pk-yrs)   Types: Cigarettes   Start date: 2002   Quit date: 2017   Years since quitting: 8.2  Smokeless Tobacco Never     Allergies  Allergen Reactions   Atenolol Anaphylaxis   Codeine Other (See Comments)    Pt says she gets "crazy"   Objective:  There were no vitals filed for this visit. There is no height or weight on file to calculate BMI. Constitutional Well developed. Well nourished.  Vascular Foot warm and well perfused. Capillary refill normal to all digits.   Neurologic Normal speech. Oriented to person, place, and time. Epicritic sensation to light touch grossly present bilaterally.  Dermatologic Skin healing well without signs of infection. Skin edges well coapted without signs of infection.  Orthopedic: Tenderness to palpation noted about the surgical site.   Radiographs: 3 views of skeletally mature right foot: Hardware is intact no signs of backing or loosening noted.  Reduction of deformity noted Assessment:   1. Toe contracture, right    Plan:  Patient was evaluated and treated and all questions answered.  S/p foot surgery right -Progressing as expected post-operatively. -XR: See above -WB Status: Weightbearing as tolerated in heel -Sutures: Intact.  No clinical signs of Deis is noted no complication noted. -Medications: None -Foot redressed.  No follow-ups on file.

## 2023-08-16 ENCOUNTER — Telehealth: Payer: Self-pay | Admitting: Podiatry

## 2023-08-16 MED ORDER — DOXYCYCLINE HYCLATE 100 MG PO TABS: 100.0000 mg | ORAL_TABLET | Freq: Two times a day (BID) | ORAL | 0 refills | Status: DC

## 2023-08-16 NOTE — Telephone Encounter (Signed)
 Patient is experiencing swelling and redness on right foot.(Diabetic) Patient is requesting antibiotics. Please contact patient as soon as possible. [phone]

## 2023-08-21 ENCOUNTER — Emergency Department (HOSPITAL_COMMUNITY)

## 2023-08-21 ENCOUNTER — Inpatient Hospital Stay (HOSPITAL_COMMUNITY)

## 2023-08-21 ENCOUNTER — Other Ambulatory Visit: Payer: Self-pay

## 2023-08-21 ENCOUNTER — Encounter (HOSPITAL_COMMUNITY): Payer: Self-pay

## 2023-08-21 ENCOUNTER — Inpatient Hospital Stay (HOSPITAL_COMMUNITY)
Admission: EM | Admit: 2023-08-21 | Discharge: 2023-08-25 | DRG: 617 | Disposition: A | Discharge status: Home Health | Attending: Family Medicine | Admitting: Family Medicine

## 2023-08-21 DIAGNOSIS — L97919 Non-pressure chronic ulcer of unspecified part of right lower leg with unspecified severity: Secondary | ICD-10-CM | POA: Diagnosis present

## 2023-08-21 DIAGNOSIS — J45909 Unspecified asthma, uncomplicated: Secondary | ICD-10-CM | POA: Diagnosis present

## 2023-08-21 DIAGNOSIS — Z885 Allergy status to narcotic agent status: Secondary | ICD-10-CM

## 2023-08-21 DIAGNOSIS — E11621 Type 2 diabetes mellitus with foot ulcer: Secondary | ICD-10-CM | POA: Diagnosis present

## 2023-08-21 DIAGNOSIS — Z79899 Other long term (current) drug therapy: Secondary | ICD-10-CM

## 2023-08-21 DIAGNOSIS — M1990 Unspecified osteoarthritis, unspecified site: Secondary | ICD-10-CM | POA: Insufficient documentation

## 2023-08-21 DIAGNOSIS — G894 Chronic pain syndrome: Secondary | ICD-10-CM | POA: Diagnosis present

## 2023-08-21 DIAGNOSIS — K279 Peptic ulcer, site unspecified, unspecified as acute or chronic, without hemorrhage or perforation: Secondary | ICD-10-CM | POA: Diagnosis not present

## 2023-08-21 DIAGNOSIS — T81328A Disruption or dehiscence of closure of other specified internal operation (surgical) wound, initial encounter: Secondary | ICD-10-CM | POA: Diagnosis not present

## 2023-08-21 DIAGNOSIS — F32A Depression, unspecified: Secondary | ICD-10-CM | POA: Diagnosis not present

## 2023-08-21 DIAGNOSIS — Z793 Long term (current) use of hormonal contraceptives: Secondary | ICD-10-CM

## 2023-08-21 DIAGNOSIS — T8142XA Infection following a procedure, deep incisional surgical site, initial encounter: Secondary | ICD-10-CM | POA: Diagnosis not present

## 2023-08-21 DIAGNOSIS — L02611 Cutaneous abscess of right foot: Secondary | ICD-10-CM

## 2023-08-21 DIAGNOSIS — Z9049 Acquired absence of other specified parts of digestive tract: Secondary | ICD-10-CM | POA: Diagnosis not present

## 2023-08-21 DIAGNOSIS — E1165 Type 2 diabetes mellitus with hyperglycemia: Secondary | ICD-10-CM | POA: Diagnosis present

## 2023-08-21 DIAGNOSIS — M869 Osteomyelitis, unspecified: Secondary | ICD-10-CM | POA: Diagnosis not present

## 2023-08-21 DIAGNOSIS — S91301A Unspecified open wound, right foot, initial encounter: Secondary | ICD-10-CM | POA: Diagnosis not present

## 2023-08-21 DIAGNOSIS — L03115 Cellulitis of right lower limb: Secondary | ICD-10-CM | POA: Diagnosis present

## 2023-08-21 DIAGNOSIS — Z9889 Other specified postprocedural states: Secondary | ICD-10-CM | POA: Diagnosis not present

## 2023-08-21 DIAGNOSIS — Z87891 Personal history of nicotine dependence: Secondary | ICD-10-CM | POA: Diagnosis not present

## 2023-08-21 DIAGNOSIS — Z7984 Long term (current) use of oral hypoglycemic drugs: Secondary | ICD-10-CM

## 2023-08-21 DIAGNOSIS — E11622 Type 2 diabetes mellitus with other skin ulcer: Secondary | ICD-10-CM | POA: Diagnosis present

## 2023-08-21 DIAGNOSIS — Z794 Long term (current) use of insulin: Secondary | ICD-10-CM | POA: Diagnosis not present

## 2023-08-21 DIAGNOSIS — Z8711 Personal history of peptic ulcer disease: Secondary | ICD-10-CM

## 2023-08-21 DIAGNOSIS — K219 Gastro-esophageal reflux disease without esophagitis: Secondary | ICD-10-CM | POA: Diagnosis not present

## 2023-08-21 DIAGNOSIS — M8618 Other acute osteomyelitis, other site: Secondary | ICD-10-CM | POA: Diagnosis not present

## 2023-08-21 DIAGNOSIS — Z981 Arthrodesis status: Secondary | ICD-10-CM

## 2023-08-21 DIAGNOSIS — Z6839 Body mass index (BMI) 39.0-39.9, adult: Secondary | ICD-10-CM

## 2023-08-21 DIAGNOSIS — E785 Hyperlipidemia, unspecified: Secondary | ICD-10-CM | POA: Diagnosis present

## 2023-08-21 DIAGNOSIS — Z888 Allergy status to other drugs, medicaments and biological substances status: Secondary | ICD-10-CM

## 2023-08-21 DIAGNOSIS — L97512 Non-pressure chronic ulcer of other part of right foot with fat layer exposed: Secondary | ICD-10-CM | POA: Diagnosis present

## 2023-08-21 DIAGNOSIS — I10 Essential (primary) hypertension: Secondary | ICD-10-CM | POA: Diagnosis present

## 2023-08-21 DIAGNOSIS — E66812 Obesity, class 2: Secondary | ICD-10-CM | POA: Diagnosis present

## 2023-08-21 DIAGNOSIS — M19071 Primary osteoarthritis, right ankle and foot: Secondary | ICD-10-CM | POA: Diagnosis present

## 2023-08-21 DIAGNOSIS — F418 Other specified anxiety disorders: Secondary | ICD-10-CM | POA: Diagnosis not present

## 2023-08-21 DIAGNOSIS — L97909 Non-pressure chronic ulcer of unspecified part of unspecified lower leg with unspecified severity: Secondary | ICD-10-CM | POA: Diagnosis not present

## 2023-08-21 DIAGNOSIS — M19279 Secondary osteoarthritis, unspecified ankle and foot: Secondary | ICD-10-CM | POA: Insufficient documentation

## 2023-08-21 DIAGNOSIS — E1169 Type 2 diabetes mellitus with other specified complication: Secondary | ICD-10-CM | POA: Diagnosis not present

## 2023-08-21 DIAGNOSIS — L97519 Non-pressure chronic ulcer of other part of right foot with unspecified severity: Secondary | ICD-10-CM | POA: Diagnosis not present

## 2023-08-21 DIAGNOSIS — M86171 Other acute osteomyelitis, right ankle and foot: Secondary | ICD-10-CM | POA: Diagnosis not present

## 2023-08-21 DIAGNOSIS — L03031 Cellulitis of right toe: Secondary | ICD-10-CM

## 2023-08-21 DIAGNOSIS — Z825 Family history of asthma and other chronic lower respiratory diseases: Secondary | ICD-10-CM

## 2023-08-21 DIAGNOSIS — F419 Anxiety disorder, unspecified: Secondary | ICD-10-CM | POA: Diagnosis present

## 2023-08-21 DIAGNOSIS — E119 Type 2 diabetes mellitus without complications: Secondary | ICD-10-CM

## 2023-08-21 DIAGNOSIS — T8140XA Infection following a procedure, unspecified, initial encounter: Secondary | ICD-10-CM

## 2023-08-21 DIAGNOSIS — L929 Granulomatous disorder of the skin and subcutaneous tissue, unspecified: Secondary | ICD-10-CM | POA: Diagnosis not present

## 2023-08-21 DIAGNOSIS — Z833 Family history of diabetes mellitus: Secondary | ICD-10-CM

## 2023-08-21 DIAGNOSIS — R600 Localized edema: Secondary | ICD-10-CM | POA: Diagnosis not present

## 2023-08-21 DIAGNOSIS — Z800 Family history of malignant neoplasm of digestive organs: Secondary | ICD-10-CM

## 2023-08-21 DIAGNOSIS — M7989 Other specified soft tissue disorders: Secondary | ICD-10-CM | POA: Diagnosis not present

## 2023-08-21 DIAGNOSIS — Z8249 Family history of ischemic heart disease and other diseases of the circulatory system: Secondary | ICD-10-CM

## 2023-08-21 DIAGNOSIS — M89541 Osteolysis, right hand: Secondary | ICD-10-CM | POA: Diagnosis not present

## 2023-08-21 LAB — BASIC METABOLIC PANEL WITH GFR
Anion gap: 13 (ref 5–15)
BUN: 18 mg/dL (ref 8–23)
CO2: 24 mmol/L (ref 22–32)
Calcium: 9.8 mg/dL (ref 8.9–10.3)
Chloride: 96 mmol/L — ABNORMAL LOW (ref 98–111)
Creatinine, Ser: 0.89 mg/dL (ref 0.44–1.00)
GFR, Estimated: >60 mL/min (ref >60)
Glucose, Bld: 404 mg/dL — ABNORMAL HIGH (ref 70–99)
Potassium: 4 mmol/L (ref 3.5–5.1)
Sodium: 133 mmol/L — ABNORMAL LOW (ref 135–145)

## 2023-08-21 LAB — I-STAT CG4 LACTIC ACID, ED
Lactic Acid, Venous: 2.2 mmol/L (ref 0.5–1.9)
Lactic Acid, Venous: 2.5 mmol/L (ref 0.5–1.9)

## 2023-08-21 LAB — HEPATIC FUNCTION PANEL
ALT: 31 U/L (ref 0–44)
AST: 31 U/L (ref 15–41)
Albumin: 3 g/dL — ABNORMAL LOW (ref 3.5–5.0)
Alkaline Phosphatase: 87 U/L (ref 38–126)
Bilirubin, Direct: 0.1 mg/dL (ref 0.0–0.2)
Indirect Bilirubin: 0.4 mg/dL (ref 0.3–0.9)
Total Bilirubin: 0.5 mg/dL (ref 0.0–1.2)
Total Protein: 6.8 g/dL (ref 6.5–8.1)

## 2023-08-21 LAB — CBC WITH DIFFERENTIAL/PLATELET
Abs Immature Granulocytes: 0.18 10*3/uL — ABNORMAL HIGH (ref 0.00–0.07)
Basophils Absolute: 0.1 10*3/uL (ref 0.0–0.1)
Basophils Relative: 1 %
Eosinophils Absolute: 0.3 10*3/uL (ref 0.0–0.5)
Eosinophils Relative: 3 %
HCT: 41.3 % (ref 36.0–46.0)
Hemoglobin: 13.6 g/dL (ref 12.0–15.0)
Immature Granulocytes: 2 %
Lymphocytes Relative: 29 %
Lymphs Abs: 3.1 10*3/uL (ref 0.7–4.0)
MCH: 28.3 pg (ref 26.0–34.0)
MCHC: 32.9 g/dL (ref 30.0–36.0)
MCV: 86 fL (ref 80.0–100.0)
Monocytes Absolute: 0.7 10*3/uL (ref 0.1–1.0)
Monocytes Relative: 6 %
Neutro Abs: 6.5 10*3/uL (ref 1.7–7.7)
Neutrophils Relative %: 59 %
Platelets: 303 10*3/uL (ref 150–400)
RBC: 4.8 MIL/uL (ref 3.87–5.11)
RDW: 12.9 % (ref 11.5–15.5)
WBC: 10.7 10*3/uL — ABNORMAL HIGH (ref 4.0–10.5)
nRBC: 0 % (ref 0.0–0.2)

## 2023-08-21 LAB — HIV ANTIBODY (ROUTINE TESTING W REFLEX): HIV Screen 4th Generation wRfx: NONREACTIVE

## 2023-08-21 LAB — GLUCOSE, CAPILLARY
Glucose-Capillary: 231 mg/dL — ABNORMAL HIGH (ref 70–99)
Glucose-Capillary: 202 mg/dL — ABNORMAL HIGH (ref 70–99)

## 2023-08-21 LAB — CBG MONITORING, ED: Glucose-Capillary: 406 mg/dL — ABNORMAL HIGH (ref 70–99)

## 2023-08-21 MED ORDER — SODIUM CHLORIDE 0.9% FLUSH
3.0000 mL | Freq: Two times a day (BID) | INTRAVENOUS | Status: DC
  Administered 2023-08-21 – 2023-08-25 (×7): 3 mL via INTRAVENOUS

## 2023-08-21 MED ORDER — SODIUM CHLORIDE 0.9 % IV SOLN: INTRAVENOUS | Status: AC

## 2023-08-21 MED ORDER — METRONIDAZOLE 500 MG/100ML IV SOLN
500.0000 mg | Freq: Two times a day (BID) | INTRAVENOUS | Status: DC
  Administered 2023-08-22 – 2023-08-24 (×5): 500 mg via INTRAVENOUS
  Filled 2023-08-21 (×5): qty 100

## 2023-08-21 MED ORDER — ACETAMINOPHEN 650 MG RE SUPP: 650.0000 mg | Freq: Four times a day (QID) | RECTAL | Status: DC | PRN

## 2023-08-21 MED ORDER — SODIUM CHLORIDE 0.9 % IV SOLN
2.0000 g | INTRAVENOUS | Status: DC
  Administered 2023-08-23: 2 g via INTRAVENOUS
  Filled 2023-08-21 (×2): qty 20

## 2023-08-21 MED ORDER — SODIUM CHLORIDE 0.9 % IV SOLN
2.0000 g | Freq: Once | INTRAVENOUS | Status: AC
  Administered 2023-08-21: 2 g via INTRAVENOUS
  Filled 2023-08-21: qty 20

## 2023-08-21 MED ORDER — ORAL CARE MOUTH RINSE: 15.0000 mL | OROMUCOSAL | Status: DC | PRN

## 2023-08-21 MED ORDER — HYDROCODONE-ACETAMINOPHEN 5-325 MG PO TABS
1.0000 | ORAL_TABLET | ORAL | Status: DC | PRN
  Administered 2023-08-21 – 2023-08-25 (×13): 1 via ORAL
  Filled 2023-08-21 (×12): qty 1

## 2023-08-21 MED ORDER — METRONIDAZOLE 500 MG/100ML IV SOLN: 500.0000 mg | Freq: Once | INTRAVENOUS | Status: DC

## 2023-08-21 MED ORDER — HYDRALAZINE HCL 20 MG/ML IJ SOLN: 5.0000 mg | Freq: Four times a day (QID) | INTRAMUSCULAR | Status: DC | PRN

## 2023-08-21 MED ORDER — HEPARIN SODIUM (PORCINE) 5000 UNIT/ML IJ SOLN
5000.0000 [IU] | Freq: Two times a day (BID) | INTRAMUSCULAR | Status: DC
  Administered 2023-08-21 – 2023-08-23 (×3): 5000 [IU] via SUBCUTANEOUS
  Filled 2023-08-21 (×3): qty 1

## 2023-08-21 MED ORDER — TRIAMTERENE-HCTZ 37.5-25 MG PO TABS
1.0000 | ORAL_TABLET | Freq: Every day | ORAL | Status: DC
  Administered 2023-08-22 – 2023-08-25 (×4): 1 via ORAL
  Filled 2023-08-21 (×4): qty 1

## 2023-08-21 MED ORDER — SODIUM CHLORIDE 0.9 % IV SOLN: 2.0000 g | Freq: Once | INTRAVENOUS | Status: DC

## 2023-08-21 MED ORDER — ACETAMINOPHEN 325 MG PO TABS: 650.0000 mg | ORAL_TABLET | Freq: Four times a day (QID) | ORAL | Status: DC | PRN

## 2023-08-21 MED ORDER — VANCOMYCIN HCL 1250 MG/250ML IV SOLN
1250.0000 mg | INTRAVENOUS | Status: DC
  Administered 2023-08-22 – 2023-08-23 (×2): 1250 mg via INTRAVENOUS
  Filled 2023-08-21 (×3): qty 250

## 2023-08-21 MED ORDER — CHLORHEXIDINE GLUCONATE CLOTH 2 % EX PADS
6.0000 | MEDICATED_PAD | Freq: Once | CUTANEOUS | Status: AC
  Administered 2023-08-21: 6 via TOPICAL

## 2023-08-21 MED ORDER — METRONIDAZOLE 500 MG/100ML IV SOLN
500.0000 mg | Freq: Once | INTRAVENOUS | Status: AC
  Administered 2023-08-21: 500 mg via INTRAVENOUS
  Filled 2023-08-21: qty 100

## 2023-08-21 MED ORDER — IOHEXOL 350 MG/ML SOLN
75.0000 mL | Freq: Once | INTRAVENOUS | Status: AC | PRN
  Administered 2023-08-21: 75 mL via INTRAVENOUS

## 2023-08-21 MED ORDER — TRIAMTERENE-HCTZ 37.5-25 MG PO CAPS: 1.0000 | ORAL_CAPSULE | Freq: Every day | ORAL | Status: DC

## 2023-08-21 MED ORDER — INSULIN ASPART 100 UNIT/ML IJ SOLN
0.0000 [IU] | Freq: Three times a day (TID) | INTRAMUSCULAR | Status: DC
  Administered 2023-08-21: 5 [IU] via SUBCUTANEOUS
  Administered 2023-08-22: 3 [IU] via SUBCUTANEOUS
  Administered 2023-08-23: 8 [IU] via SUBCUTANEOUS
  Administered 2023-08-23 (×2): 5 [IU] via SUBCUTANEOUS
  Administered 2023-08-24 – 2023-08-25 (×4): 8 [IU] via SUBCUTANEOUS
  Filled 2023-08-21: qty 1

## 2023-08-21 MED ORDER — VANCOMYCIN HCL IN DEXTROSE 1-5 GM/200ML-% IV SOLN: 1000.0000 mg | Freq: Once | INTRAVENOUS | Status: DC

## 2023-08-21 MED ORDER — INSULIN ASPART 100 UNIT/ML IJ SOLN
0.0000 [IU] | Freq: Every day | INTRAMUSCULAR | Status: DC
  Administered 2023-08-21: 2 [IU] via SUBCUTANEOUS
  Administered 2023-08-22: 3 [IU] via SUBCUTANEOUS
  Administered 2023-08-23: 5 [IU] via SUBCUTANEOUS
  Administered 2023-08-24: 4 [IU] via SUBCUTANEOUS

## 2023-08-21 MED ORDER — VANCOMYCIN HCL 1500 MG/300ML IV SOLN
1500.0000 mg | Freq: Once | INTRAVENOUS | Status: AC
  Administered 2023-08-21: 1500 mg via INTRAVENOUS
  Filled 2023-08-21: qty 300

## 2023-08-21 MED ORDER — LACTATED RINGERS IV SOLN: Freq: Once | INTRAVENOUS | Status: DC

## 2023-08-21 MED ORDER — CHLORHEXIDINE GLUCONATE CLOTH 2 % EX PADS
6.0000 | MEDICATED_PAD | Freq: Once | CUTANEOUS | Status: AC
  Administered 2023-08-22: 6 via TOPICAL

## 2023-08-21 MED ORDER — LOSARTAN POTASSIUM 50 MG PO TABS
25.0000 mg | ORAL_TABLET | Freq: Every day | ORAL | Status: DC
  Administered 2023-08-22 – 2023-08-25 (×4): 25 mg via ORAL
  Filled 2023-08-21 (×4): qty 1

## 2023-08-21 NOTE — Progress Notes (Signed)
 Pharmacy Antibiotic Note  Amy Garner is a 68 y.o. female admitted on 08/21/2023 presenting with diabetic foot ulcer infection, concern for osteo on XR.  Pharmacy has been consulted for vancomycin, flagyl, ceftriaxone dosing.  Vancomycin 1500 mg IV x 1 given in ED  Plan: Vancomycin 1250 mg IV q 24h (eAUC 440) Flagyl 500 mg IV q 12h Ceftriaxone 2g IV q 24h Monitor renal function, Cx, clinical progression and podiatry recs Vancomycin levels as indicated  Height: 5\' 6"  (167.6 cm) Weight: 111.6 kg (246 lb) IBW/kg (Calculated) : 59.3  Temp (24hrs), Avg:98.9 F (37.2 C), Min:98.5 F (36.9 C), Max:99.3 F (37.4 C)  Recent Labs  Lab 08/21/23 1247 08/21/23 1253 08/21/23 1516  WBC 10.7*  --   --   CREATININE 0.89  --   --   LATICACIDVEN  --  2.2* 2.5*    Estimated Creatinine Clearance: 76.6 mL/min (by C-G formula based on SCr of 0.89 mg/dL).    Allergies  Allergen Reactions   Atenolol Anaphylaxis   Codeine Other (See Comments)    Pt says she gets "crazy"    Daylene Posey, PharmD, [facility] Clinical Pharmacist ED Pharmacist Phone # [phone] 08/21/2023 4:22 PM

## 2023-08-21 NOTE — ED Provider Notes (Signed)
 [place] EMERGENCY DEPARTMENT AT [facility] Provider Note   CSN: [id number] Arrival date & time: 08/21/23  1232     History  Chief Complaint  Patient presents with   Diabetic Ulcer    Amy Garner is a 68 y.o. female.  HPI   Patient has a history of diabetes.  She states she had surgery by her podiatrist on March 17.  Patient had a contracted toe.  Patient states she had a follow-up surgery on April 1.  She started having some increasing soreness.  She also noticed some swelling and redness.  Her doctor sent in antibiotics.  Daughter noted today after removing the dressing that she had a large wound that had opened up.  Patient has felt chilled she has had some nausea vomiting but no definite fevers.  Home Medications Prior to Admission medications   Medication Sig Start Date End Date Taking? Authorizing Provider  albuterol (VENTOLIN HFA) 108 (90 Base) MCG/ACT inhaler Inhale 2 puffs into the lungs every 6 (six) hours as needed for wheezing or shortness of breath. 09/30/22   Georganna Skeans, MD  Alcohol Swabs PADS USE TO TAKE BLOOD SUGAR 2 TIMES A DAY 07/27/21   Georganna Skeans, MD  cholecalciferol (VITAMIN D3) 25 MCG (1000 UNIT) tablet Take 1,000 Units by mouth daily.    [provider]  doxycycline (VIBRA-TABS) 100 MG tablet Take 1 tablet (100 mg total) by mouth 2 (two) times daily. 02/22/23   Candelaria Stagers, DPM  doxycycline (VIBRA-TABS) 100 MG tablet Take 1 tablet (100 mg total) by mouth 2 (two) times daily. 08/16/23   Candelaria Stagers, DPM  esomeprazole (NEXIUM) 40 MG capsule TAKE 1 CAPSULE (40 MG TOTAL) BY MOUTH IN THE MORNING. 01/18/23   Georganna Skeans, MD  FLUoxetine (PROZAC) 20 MG capsule Take 1 capsule (20 mg total) by mouth daily. 07/21/23   Georganna Skeans, MD  glucose blood (TRUE METRIX BLOOD GLUCOSE TEST) test strip Use as instructed 07/27/21   Georganna Skeans, MD  glucose blood test strip Use as instructed 09/30/22   Georganna Skeans, MD  ibuprofen (ADVIL) 800  MG tablet Take 1 tablet (800 mg total) by mouth every 6 (six) hours as needed. 02/07/23   Candelaria Stagers, DPM  ibuprofen (ADVIL) 800 MG tablet Take 1 tablet (800 mg total) by mouth every 6 (six) hours as needed. 08/01/23   Candelaria Stagers, DPM  insulin glargine, 1 Unit Dial, (TOUJEO SOLOSTAR) 300 UNIT/ML Solostar Pen INJECT 62 UNITS INTO THE SKIN DAILY. 02/22/23   Hoy Register, MD  Insulin Pen Needle (PEN NEEDLES) 32G X 4 MM MISC Use to inject insulin once daily. 06/18/22   Hoy Register, MD  losartan (COZAAR) 25 MG tablet TAKE 1 TABLET EVERY DAY 06/11/22   Georganna Skeans, MD  lovastatin (MEVACOR) 10 MG tablet TAKE 1 TABLET AT BEDTIME 04/27/23   Georganna Skeans, MD  meclizine (ANTIVERT) 25 MG tablet TAKE 1 TABLET (25 MG TOTAL) BY MOUTH 3 (THREE) TIMES DAILY AS NEEDED FOR DIZZINESS. 07/01/23   Georganna Skeans, MD  metFORMIN (GLUCOPHAGE-XR) 500 MG 24 hr tablet TAKE 2 TABLETS twice daily with meals. 09/24/22   Hoy Register, MD  Misc. Devices MISC TRUE MATRIX METER 09/30/22   Georganna Skeans, MD  nitrofurantoin, macrocrystal-monohydrate, (MACROBID) 100 MG capsule Take 1 capsule (100 mg total) by mouth 2 (two) times daily. 03/29/23   Georganna Skeans, MD  oxyCODONE-acetaminophen (PERCOCET) 5-325 MG tablet Take 1 tablet by mouth every 4 (four) hours as needed  for severe pain (pain score 7-10). 08/01/23   Candelaria Stagers, DPM  Semaglutide, 2 MG/DOSE, (OZEMPIC, 2 MG/DOSE,) 8 MG/3ML SOPN INJECT 2MG  UNDER THE SKIN ONE TIME WEEKLY AS DIRECTED Patient not taking: Reported on 07/21/2023 11/08/22   Georganna Skeans, MD  triamterene-hydrochlorothiazide (DYAZIDE) 37.5-25 MG capsule TAKE 1 CAPSULE EVERY DAY 06/11/22   Georganna Skeans, MD  TRUEplus Lancets 33G MISC USE TO TAKE BLOOD SUGAR 2 TIMES A DAY 07/27/21   Georganna Skeans, MD      Allergies    Atenolol and Codeine    Review of Systems   Review of Systems  Physical Exam Updated Vital Signs BP (!) 156/69 (BP Location: Right Arm)   Pulse 89   Temp 99.3 F (37.4 C)    Resp 18   Ht 1.676 m (5\' 6" )   Wt 111.6 kg   SpO2 97%   BMI 39.71 kg/m  Physical Exam Vitals and nursing note reviewed.  Constitutional:      General: She is not in acute distress.    Appearance: She is well-developed.  HENT:     Head: Normocephalic and atraumatic.     Right Ear: External ear normal.     Left Ear: External ear normal.  Eyes:     General: No scleral icterus.       Right eye: No discharge.        Left eye: No discharge.     Conjunctiva/sclera: Conjunctivae normal.  Neck:     Trachea: No tracheal deviation.  Cardiovascular:     Rate and Rhythm: Normal rate and regular rhythm.  Pulmonary:     Effort: Pulmonary effort is normal. No respiratory distress.     Breath sounds: Normal breath sounds. No stridor. No wheezing or rales.  Abdominal:     General: Bowel sounds are normal. There is no distension.     Palpations: Abdomen is soft.     Tenderness: There is no abdominal tenderness. There is no guarding or rebound.  Musculoskeletal:        General: Tenderness present.     Cervical back: Neck supple.     Comments: Patient has an large ulcerative wound dorsal aspect of her big toe, on the right foot, mucopurulent drainage noted, erythema and edema  Skin:    General: Skin is warm and dry.     Findings: No rash.  Neurological:     General: No focal deficit present.     Mental Status: She is alert.     Cranial Nerves: No cranial nerve deficit, dysarthria or facial asymmetry.     Sensory: No sensory deficit.     Motor: No abnormal muscle tone or seizure activity.     Coordination: Coordination normal.  Psychiatric:        Mood and Affect: Mood normal.     ED Results / Procedures / Treatments   Labs (all labs ordered are listed, but only abnormal results are displayed) Labs Reviewed  CBC WITH DIFFERENTIAL/PLATELET - Abnormal; Notable for the following components:      Result Value   WBC 10.7 (*)    Abs Immature Granulocytes 0.18 (*)    All other components  within normal limits  BASIC METABOLIC PANEL WITH GFR - Abnormal; Notable for the following components:   Sodium 133 (*)    Chloride 96 (*)    Glucose, Bld 404 (*)    All other components within normal limits  CBG MONITORING, ED - Abnormal; Notable for the following components:  Glucose-Capillary 406 (*)    All other components within normal limits  I-STAT CG4 LACTIC ACID, ED - Abnormal; Notable for the following components:   Lactic Acid, Venous 2.2 (*)    All other components within normal limits  CULTURE, BLOOD (ROUTINE X 2)  CULTURE, BLOOD (ROUTINE X 2)  CBG MONITORING, ED  I-STAT CG4 LACTIC ACID, ED  I-STAT CG4 LACTIC ACID, ED    EKG None  Radiology DG Foot Complete Right Result Date: 08/21/2023 CLINICAL DATA:  Wound. EXAM: RIGHT FOOT COMPLETE - 3+ VIEW COMPARISON:  Right foot radiographs 08/10/2023. FINDINGS: Arthrodesis the first MTP joint is again noted. Progressive lucency is noted about the distal screw across the IP joint. Osteolysis is noted within the distal phalanx in the distal aspect of the proximal phalanx. Extensive edema and gas is present within the great toe. Diffuse soft tissue swelling is present over the dorsum of the foot. IMPRESSION: 1. Progressive lucency about the distal screw across the IP joint of the great toe with osteolysis within the distal phalanx and distal aspect of the proximal phalanx. Findings are concerning for osteomyelitis. 2. Extensive edema and gas within the great toe. 3. Diffuse soft tissue swelling over the dorsum of the foot. Electronically Signed   By: Marin Roberts M.D.   On: 08/21/2023 13:58    Procedures Procedures    Medications Ordered in ED Medications  cefTRIAXone (ROCEPHIN) 2 g in sodium chloride 0.9 % 100 mL IVPB (has no administration in time range)    And  metroNIDAZOLE (FLAGYL) IVPB 500 mg (has no administration in time range)    And  vancomycin (VANCOREADY) IVPB 1500 mg/300 mL (has no administration in time  range)    ED Course/ Medical Decision Making/ A&P Clinical Course as of 08/21/23 1444  Sun Aug 21, 2023  1431 Case discussed with Dr Loreta Ave podiatry.  WIll see pt in the ED [JK]  1439 CAse discussed with Dr Allena Katz regarding admission [JK]    Clinical Course User Index [JK] Linwood Dibbles, MD                                 Medical Decision Making Problems Addressed: Osteomyelitis, unspecified site, unspecified type [facility]): acute illness or injury that poses a threat to life or bodily functions Postoperative infection, unspecified type, initial encounter: acute illness or injury that poses a threat to life or bodily functions  Amount and/or Complexity of Data Reviewed Labs: ordered. Decision-making details documented in ED Course. Radiology: ordered and independent interpretation performed.  Risk Prescription drug management.   Patient presented to the ED for evaluation of an infection after recent surgery.  Patient has obvious ulcerative wound at her surgical site of her toe.  Her x-rays show findings concerning for osteomyelitis and possible gas in the tissue.  Patient has slightly elevated lactic acid level but at this time no signs of hypotension or tachycardia to suggest evolving sepsis.  I am concerned about the extensive nature of her foot wound.  Will start her on broad-spectrum antibiotics.  I have consulted with Dr. Loreta Ave podiatry who will come see the patient in the ED.  I spoken with Dr. Allena Katz hospitalist service and we will admit the patient to hospital for further treatment.       Final Clinical Impression(s) / ED Diagnoses Final diagnoses:  Osteomyelitis, unspecified site, unspecified type (HCC)  Postoperative infection, unspecified type, initial encounter  Rx / DC Orders ED Discharge Orders     None         Linwood Dibbles, MD 08/21/23 1444

## 2023-08-21 NOTE — Assessment & Plan Note (Signed)
 IV PPI and aspiration precaution.

## 2023-08-21 NOTE — Assessment & Plan Note (Signed)
 Glycemic protocol with premeal and has coverage.

## 2023-08-21 NOTE — Assessment & Plan Note (Signed)
 Vitals:   08/21/23 1235 08/21/23 1500 08/21/23 1530 08/21/23 1635  BP: (!) 156/69 139/86 131/64 (!) 125/97   08/21/23 1921  BP: (!) 120/38  Vancomycin, rocephin and flagyl .

## 2023-08-21 NOTE — ED Triage Notes (Signed)
 Recent surgery on right foot and on 4/1 started having redness swelling to top of foot, started antibiotics on 4/2 and then an ulcer appeared and ruptured.  Foot is painful and reports its drainage and an ulcer the size of silver dollar.  Patient is a diabetic.  Reports glucose was high before starting the antibiotics.

## 2023-08-21 NOTE — Consult Note (Signed)
 Reason for Consult:Osteomyelitis right foot Referring Physician: Dr. Linwood Dibbles, MD  Amy Garner is an 68 y.o. female.  HPI: 68 year old female presented to hospital today with concerns of right foot infection.  She states that she recently underwent surgery with Dr. Nicholes Rough for a big toe fusion. She was seen in the clinic on July 13, 2023.  At that time she states that she was doing well.  A couple days later she started noticing increased swelling.  She then contacted the office and she was started on doxycycline and she started this on August 17, 2023.  She was previously having chills but she currently denies any fevers or chills.  Last night her daughter states that change the bandage and noticed an open wound and they came into the hospital today.  Currently denies any fevers or chills but she is nauseated.   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    Peptic ulcer    Pneumonia    Type 2 diabetes mellitus (HCC)     Past Surgical History:  Procedure Laterality Date   APPENDECTOMY  1990   BREAST BIOPSY     BUNIONECTOMY Right    CAPSULOTOMY METATARSOPHALANGEAL Right 02/07/2023   Procedure: CAPSULOTOMY METATARSOPHALANGEAL SECOND AND THIRD;  Surgeon: Candelaria Stagers, DPM;  Location: [facility] ORS;  Service: Podiatry;  Laterality: Right;   CERVICAL DISC SURGERY     CHOLECYSTECTOMY     COLONOSCOPY     DIAGNOSTIC LAPAROSCOPY  1980   endometriosis   HALLUX FUSION Right 02/07/2023   Procedure: HALLUX FUSION METATARSAL PHALANGEAL JOINT;  Surgeon: Candelaria Stagers, DPM;  Location: [facility] ORS;  Service: Podiatry;  Laterality: Right;  POPLITEAL BLOCK   HAMMER TOE SURGERY Right  02/07/2023   Procedure: HAMMER TOE CORRECTION SECOND AND THIRD;  Surgeon: Candelaria Stagers, DPM;  Location: [facility] ORS;  Service: Podiatry;  Laterality: Right;   HERNIA REPAIR  Inguinal right & left repair.   INGUINAL HERNIA REPAIR Left 05/02/2020   Procedure: OPEN LEFT INGUINAL HERNIA REPAIR WITH MESH, TAP BLOCK;  Surgeon: Berna Bue, MD;  Location: [facility] ORS;  Service: General;  Laterality: Left;   TUBAL LIGATION  1990s    Family History  Problem Relation Age of Onset   Heart attack Mother    Asthma Mother    Heart disease Father    Hypertension Father    Stomach cancer Paternal Aunt    Colon cancer Paternal Aunt    Varicose Veins Maternal Grandmother    Diabetes Paternal Grandmother    ADD / ADHD Daughter    ADD / ADHD Son     Social History:  reports that she quit smoking about 8 years ago. Her smoking use included cigarettes. She started smoking about 23 years ago. She has a 22.5 pack-year smoking history. She has never used smokeless tobacco. She reports that she does not drink alcohol and does not use drugs.  Allergies:  Allergies  Allergen Reactions   Atenolol Anaphylaxis   Codeine Other (See Comments)    Pt says she gets "crazy"    Medications: I have reviewed the  patient's current medications.  Results for orders placed or performed during the hospital encounter of 08/21/23 (from the past 48 hours)  CBG monitoring, ED     Status: Abnormal   Collection Time: 08/21/23 12:37 PM  Result Value Ref Range   Glucose-Capillary 406 (H) 70 - 99 mg/dL    Comment: Glucose reference range applies only to samples taken after fasting for at least 8 hours.  CBC with Differential     Status: Abnormal   Collection Time: 08/21/23 12:47 PM  Result Value Ref Range   WBC 10.7 (H) 4.0 - 10.5 K/uL   RBC 4.80 3.87 - 5.11 MIL/uL   Hemoglobin 13.6 12.0 - 15.0 g/dL   HCT 19.1 47.8 - 29.5 %   MCV 86.0 80.0 - 100.0 fL   MCH 28.3 26.0 - 34.0 pg   MCHC 32.9 30.0 - 36.0 g/dL   RDW 62.1 30.8  - 65.7 %   Platelets 303 150 - 400 K/uL   nRBC 0.0 0.0 - 0.2 %   Neutrophils Relative % 59 %   Neutro Abs 6.5 1.7 - 7.7 K/uL   Lymphocytes Relative 29 %   Lymphs Abs 3.1 0.7 - 4.0 K/uL   Monocytes Relative 6 %   Monocytes Absolute 0.7 0.1 - 1.0 K/uL   Eosinophils Relative 3 %   Eosinophils Absolute 0.3 0.0 - 0.5 K/uL   Basophils Relative 1 %   Basophils Absolute 0.1 0.0 - 0.1 K/uL   Immature Granulocytes 2 %   Abs Immature Granulocytes 0.18 (H) 0.00 - 0.07 K/uL    Comment: Performed at [facility], [address], [place], [place] [address]  Basic metabolic panel     Status: Abnormal   Collection Time: 08/21/23 12:47 PM  Result Value Ref Range   Sodium 133 (L) 135 - 145 mmol/L   Potassium 4.0 3.5 - 5.1 mmol/L   Chloride 96 (L) 98 - 111 mmol/L   CO2 24 22 - 32 mmol/L   Glucose, Bld 404 (H) 70 - 99 mg/dL    Comment: Glucose reference range applies only to samples taken after fasting for at least 8 hours.   BUN 18 8 - 23 mg/dL   Creatinine, Ser 2.95 0.44 - 1.00 mg/dL   Calcium 9.8 8.9 - 28.4 mg/dL   GFR, Estimated >13 >24 mL/min    Comment: (NOTE) Calculated using the CKD-EPI Creatinine Equation (2021)    Anion gap 13 5 - 15    Comment: Performed at [facility], [address], [place], [place] [address]  I-Stat CG4 Lactic Acid     Status: Abnormal   Collection Time: 08/21/23 12:53 PM  Result Value Ref Range   Lactic Acid, Venous 2.2 (HH) 0.5 - 1.9 mmol/L   Comment NOTIFIED PHYSICIAN   I-Stat CG4 Lactic Acid     Status: Abnormal   Collection Time: 08/21/23  3:16 PM  Result Value Ref Range   Lactic Acid, Venous 2.5 (HH) 0.5 - 1.9 mmol/L   Comment NOTIFIED PHYSICIAN     DG Foot Complete Right Result Date: 08/21/2023 CLINICAL DATA:  Wound. EXAM: RIGHT FOOT COMPLETE - 3+ VIEW COMPARISON:  Right foot radiographs 08/10/2023. FINDINGS: Arthrodesis the first MTP joint is again noted. Progressive lucency is noted about the distal screw across the IP joint. Osteolysis  is noted within the distal phalanx in the distal aspect of the proximal phalanx. Extensive edema and gas is present within the great toe. Diffuse soft tissue swelling is  present over the dorsum of the foot. IMPRESSION: 1. Progressive lucency about the distal screw across the IP joint of the great toe with osteolysis within the distal phalanx and distal aspect of the proximal phalanx. Findings are concerning for osteomyelitis. 2. Extensive edema and gas within the great toe. 3. Diffuse soft tissue swelling over the dorsum of the foot. Electronically Signed   By: Marin Roberts M.D.   On: 08/21/2023 13:58    Review of Systems Blood pressure 131/64, pulse 67, temperature 98.5 F (36.9 C), temperature source Oral, resp. rate 19, height 5\' 6"  (1.676 m), weight 111.6 kg, SpO2 95%. Physical Exam General: AAO x3, NAD  Dermatological: Full-thickness ulceration along the first MTPJ with serosanguineous, purulent drainage.  The wound does probe directly down the hardware.  There is edema and erythema present to the hallux and around the wound as pictured below.  There is no pain with ankle or proximal leg pain.  There is no crepitation.       Vascular: Foot appears to be warm and perfused  Neruologic: Decreased  Musculoskeletal: No significant pain on exam.   Assessment/Plan: Osteomyelitis, infection right foot  -I reviewed the x-rays.  There is concern for osteomyelitis.  Also edema noted with air in the toe.  There is an open draining wound present.  There is no proximal infection. - She has been started on broad-spectrum antibiotics including metronidazole, vancomycin, ceftriaxone.  Continue broad-spectrum antibiotics. - Will order CT scan to further evaluate extent of the infection - ABI ordered - I discussed surgery with the patient and her daughter who is present today.  I recommended partial first ray amputation given the infection and there is visible hardware. She is also an  uncontrolled diabetic. The patient's daughter asked about alternatives to this.  Discussed that debridement, removal of hardware as well as biopsy.  Discussed that she will need extended antibiotics and we discussed pros and cons of this.  Patient seems to be open to amputation but there can further discuss close overnight and will make a final decision tomorrow.  Either way plan for surgery tomorrow.  She is currently stable (currently afebrile, blood pressure and pulse stable). N.p.o. after midnight.  We discussed risks of the procedure as well as alternatives, risks, complications.  We discussed risks of surgery including spread of infection, amputation or further amputation aside from just the hallux bleeding, scarring, delayed/nonhealing as well as general risks of surgery. -NPO after midnight.  -Podiatry will continue to follow. I will discuss with Dr. Annamary Rummage who is on-call tomorrow for [place] and he will be doing the surgery. Patient/daughter aware.   Vivi Barrack 08/21/2023, 4:18 PM

## 2023-08-21 NOTE — H&P (Signed)
 History and Physical    Patient: Amy Garner ZOX:[id number] DOB: 25-Oct-1955 DOA: 08/21/2023 DOS: the patient was seen and examined on 08/21/2023 PCP: Amy Skeans, MD  Patient coming from: Home Chief complaint: Chief Complaint  Patient presents with   Diabetic Ulcer   HPI:  Amy Garner is a 68 y.o. female with past medical history  of allergies to atenolol and codeine, diabetes mellitus type 2, essential hypertension, peptic ulcer disease, GERD, depression, hyperlipidemia, anxiety, asthma, osteoarthritis of the first MTP presenting to [place] with pain and erythema swelling initially on the dorsum of her foot that eventually ruptured leaving an ulcer and drainage where she had surgery.  No reports of chest pain shortness of breath headaches dizziness falls.   ED Course: Pt in ed at bedside  is alert awake oriented afebrile Vital signs in the ED were notable for the following:  Vitals:   08/21/23 1535 08/21/23 1635 08/21/23 1709 08/21/23 1921  BP:  (!) 125/97  (!) 120/38  Pulse:  67  71  Temp: 98.5 F (36.9 C) 98.7 F (37.1 C)  97.9 F (36.6 C)  Resp:  18  17  Height:   5\' 6"  (1.676 m)   Weight:   107.3 kg   SpO2:  97%  97%  TempSrc: Oral Oral  Oral  BMI (Calculated):   38.2    >>ED evaluation thus far shows: Initial BMP shows sodium 133 glucose 404 normal kidney function added on LFTs are within normal limits. CBC shows a white count of 10.7 normal hemoglobin and platelets. Elevated lactic acid of 2.2 repeat of 2.5.  >>While in the ED patient received the following: Medications  heparin injection 5,000 Units (5,000 Units Subcutaneous Given 08/21/23 2056)  sodium chloride flush (NS) 0.9 % injection 3 mL (has no administration in time range)  0.9 %  sodium chloride infusion ( Intravenous Infusion Verify 08/21/23 1730)  acetaminophen (TYLENOL) tablet 650 mg (has no administration in time range)    Or  acetaminophen (TYLENOL) suppository 650 mg (has no administration in  time range)  HYDROcodone-acetaminophen (NORCO/VICODIN) 5-325 MG per tablet 1 tablet (1 tablet Oral Given 08/21/23 2055)  hydrALAZINE (APRESOLINE) injection 5 mg (has no administration in time range)  losartan (COZAAR) tablet 25 mg (has no administration in time range)  insulin aspart (novoLOG) injection 0-15 Units (5 Units Subcutaneous Given 08/21/23 1644)  insulin aspart (novoLOG) injection 0-5 Units (has no administration in time range)  triamterene-hydrochlorothiazide (MAXZIDE-25) 37.5-25 MG per tablet 1 tablet (has no administration in time range)  cefTRIAXone (ROCEPHIN) 2 g in sodium chloride 0.9 % 100 mL IVPB (has no administration in time range)  metroNIDAZOLE (FLAGYL) IVPB 500 mg (has no administration in time range)  vancomycin (VANCOREADY) IVPB 1250 mg/250 mL (has no administration in time range)  Chlorhexidine Gluconate Cloth 2 % PADS 6 each (has no administration in time range)    And  Chlorhexidine Gluconate Cloth 2 % PADS 6 each (has no administration in time range)  Oral care mouth rinse (has no administration in time range)  cefTRIAXone (ROCEPHIN) 2 g in sodium chloride 0.9 % 100 mL IVPB (0 g Intravenous Stopped 08/21/23 1616)    And  metroNIDAZOLE (FLAGYL) IVPB 500 mg (0 mg Intravenous Stopped 08/21/23 1622)    And  vancomycin (VANCOREADY) IVPB 1500 mg/300 mL (0 mg Intravenous Stopped 08/21/23 1718)  iohexol (OMNIPAQUE) 350 MG/ML injection 75 mL (75 mLs Intravenous Contrast Given 08/21/23 1754)     Review of Systems  Constitutional:  Positive for chills. Negative for fever.  Gastrointestinal:  Positive for nausea.  Musculoskeletal:  Positive for joint pain.   Past Medical History:  Diagnosis Date   Allergy Codiene & Atenolal   Anxiety    Asthma    When ill   Bunion, right foot    Cervical radiculopathy    Chronic pain syndrome    Depression    GERD (gastroesophageal reflux disease)    Hyperlipidemia    Hypertension    Osteoarthritis of first metatarsophalangeal (MTP)  joint due to inflammatory arthritis    Peptic ulcer    Pneumonia    Type 2 diabetes mellitus (HCC)    Past Surgical History:  Procedure Laterality Date   APPENDECTOMY  1990   BREAST BIOPSY     BUNIONECTOMY Right    CAPSULOTOMY METATARSOPHALANGEAL Right 02/07/2023   Procedure: CAPSULOTOMY METATARSOPHALANGEAL SECOND AND THIRD;  Surgeon: Candelaria Stagers, DPM;  Location: [facility];  Service: Podiatry;  Laterality: Right;   CERVICAL DISC SURGERY     CHOLECYSTECTOMY     COLONOSCOPY     DIAGNOSTIC LAPAROSCOPY  1980   endometriosis   HALLUX FUSION Right 02/07/2023   Procedure: HALLUX FUSION METATARSAL PHALANGEAL JOINT;  Surgeon: Candelaria Stagers, DPM;  Location: [facility];  Service: Podiatry;  Laterality: Right;  POPLITEAL BLOCK   HAMMER TOE SURGERY Right 02/07/2023   Procedure: HAMMER TOE CORRECTION SECOND AND THIRD;  Surgeon: Candelaria Stagers, DPM;  Location: [facility];  Service: Podiatry;  Laterality: Right;   HERNIA REPAIR  Inguinal right & left repair.   INGUINAL HERNIA REPAIR Left 05/02/2020   Procedure: OPEN LEFT INGUINAL HERNIA REPAIR WITH MESH, TAP BLOCK;  Surgeon: Berna Bue, MD;  Location: [facility];  Service: General;  Laterality: Left;   TUBAL LIGATION  1990s    reports that she quit smoking about 8 years ago. Her smoking use included cigarettes. She started smoking about 23 years ago. She has a 22.5 pack-year smoking history. She has never used smokeless tobacco. She reports that she does not drink alcohol and does not use drugs. Allergies  Allergen Reactions   Atenolol Anaphylaxis   Codeine Other (See Comments)    Pt says she gets "crazy"   Family History  Problem Relation Age of Onset   Heart attack Mother    Asthma Mother    Heart disease Father    Hypertension Father    Stomach cancer Paternal Aunt    Colon cancer Paternal Aunt    Varicose Veins Maternal Grandmother    Diabetes Paternal Grandmother    ADD / ADHD Daughter    ADD / ADHD Son    Prior to Admission  medications   Medication Sig Start Date End Date Taking? Authorizing Provider  albuterol (VENTOLIN HFA) 108 (90 Base) MCG/ACT inhaler Inhale 2 puffs into the lungs every 6 (six) hours as needed for wheezing or shortness of breath. 09/30/22   Amy Skeans, MD  Alcohol Swabs PADS USE TO TAKE BLOOD SUGAR 2 TIMES A DAY 07/27/21   Amy Skeans, MD  cholecalciferol (VITAMIN D3) 25 MCG (1000 UNIT) tablet Take 1,000 Units by mouth daily.    [provider]  doxycycline (VIBRA-TABS) 100 MG tablet Take 1 tablet (100 mg total) by mouth 2 (two) times daily. 02/22/23   Candelaria Stagers, DPM  doxycycline (VIBRA-TABS) 100 MG tablet Take 1 tablet (100 mg total) by mouth 2 (two) times daily. 08/16/23   Candelaria Stagers, DPM  esomeprazole (NEXIUM) 40 MG  capsule TAKE 1 CAPSULE (40 MG TOTAL) BY MOUTH IN THE MORNING. 01/18/23   Amy Skeans, MD  FLUoxetine (PROZAC) 20 MG capsule Take 1 capsule (20 mg total) by mouth daily. 07/21/23   Amy Skeans, MD  glucose blood (TRUE METRIX BLOOD GLUCOSE TEST) test strip Use as instructed 07/27/21   Amy Skeans, MD  glucose blood test strip Use as instructed 09/30/22   Amy Skeans, MD  ibuprofen (ADVIL) 800 MG tablet Take 1 tablet (800 mg total) by mouth every 6 (six) hours as needed. 02/07/23   Candelaria Stagers, DPM  ibuprofen (ADVIL) 800 MG tablet Take 1 tablet (800 mg total) by mouth every 6 (six) hours as needed. 08/01/23   Candelaria Stagers, DPM  insulin glargine, 1 Unit Dial, (TOUJEO SOLOSTAR) 300 UNIT/ML Solostar Pen INJECT 62 UNITS INTO THE SKIN DAILY. 02/22/23   Hoy Register, MD  Insulin Pen Needle (PEN NEEDLES) 32G X 4 MM MISC Use to inject insulin once daily. 06/18/22   Hoy Register, MD  losartan (COZAAR) 25 MG tablet TAKE 1 TABLET EVERY DAY 06/11/22   Amy Skeans, MD  lovastatin (MEVACOR) 10 MG tablet TAKE 1 TABLET AT BEDTIME 04/27/23   Amy Skeans, MD  meclizine (ANTIVERT) 25 MG tablet TAKE 1 TABLET (25 MG TOTAL) BY MOUTH 3 (THREE) TIMES DAILY AS NEEDED FOR  DIZZINESS. 07/01/23   Amy Skeans, MD  metFORMIN (GLUCOPHAGE-XR) 500 MG 24 hr tablet TAKE 2 TABLETS twice daily with meals. 09/24/22   Hoy Register, MD  Misc. Devices MISC TRUE MATRIX METER 09/30/22   Amy Skeans, MD  nitrofurantoin, macrocrystal-monohydrate, (MACROBID) 100 MG capsule Take 1 capsule (100 mg total) by mouth 2 (two) times daily. 03/29/23   Amy Skeans, MD  oxyCODONE-acetaminophen (PERCOCET) 5-325 MG tablet Take 1 tablet by mouth every 4 (four) hours as needed for severe pain (pain score 7-10). 08/01/23   Candelaria Stagers, DPM  Semaglutide, 2 MG/DOSE, (OZEMPIC, 2 MG/DOSE,) 8 MG/3ML SOPN INJECT 2MG  UNDER THE SKIN ONE TIME WEEKLY AS DIRECTED Patient not taking: Reported on 07/21/2023 11/08/22   Amy Skeans, MD  triamterene-hydrochlorothiazide (DYAZIDE) 37.5-25 MG capsule TAKE 1 CAPSULE EVERY DAY 06/11/22   Amy Skeans, MD  TRUEplus Lancets 33G MISC USE TO TAKE BLOOD SUGAR 2 TIMES A DAY 07/27/21   Amy Skeans, MD                                                                                 Vitals:   08/21/23 1535 08/21/23 1635 08/21/23 1709 08/21/23 1921  BP:  (!) 125/97  (!) 120/38  Pulse:  67  71  Resp:  18  17  Temp: 98.5 F (36.9 C) 98.7 F (37.1 C)  97.9 F (36.6 C)  TempSrc: Oral Oral  Oral  SpO2:  97%  97%  Weight:   107.3 kg   Height:   5\' 6"  (1.676 m)    Physical Exam Vitals and nursing note reviewed.  Constitutional:      General: She is not in acute distress. HENT:     Head: Normocephalic and atraumatic.     Right Ear: Hearing normal.     Left Ear: Hearing normal.  Nose: No nasal deformity.     Mouth/Throat:     Lips: Pink.     Tongue: No lesions.  Eyes:     General: Lids are normal.     Extraocular Movements: Extraocular movements intact.  Cardiovascular:     Rate and Rhythm: Normal rate and regular rhythm.     Pulses:          Dorsalis pedis pulses are 2+ on the right side and 2+ on the left side.     Heart sounds: Normal heart  sounds.  Pulmonary:     Effort: Pulmonary effort is normal.     Breath sounds: Normal breath sounds.  Abdominal:     General: Bowel sounds are normal. There is no distension.     Palpations: Abdomen is soft. There is no mass.     Tenderness: There is no abdominal tenderness.  Musculoskeletal:     Right lower leg: No edema.     Left lower leg: No edema.  Skin:    General: Skin is warm.     Findings: Erythema and wound present.     Comments: Ulcer and purulent drainage.   Neurological:     General: No focal deficit present.     Mental Status: She is alert and oriented to person, place, and time.     Cranial Nerves: Cranial nerves 2-12 are intact.  Psychiatric:        Attention and Perception: Attention normal.        Mood and Affect: Mood normal.        Speech: Speech normal.        Behavior: Behavior normal. Behavior is cooperative.     Labs on Admission: I have personally reviewed following labs and imaging studies CBC: Recent Labs  Lab 08/21/23 1247  WBC 10.7*  NEUTROABS 6.5  HGB 13.6  HCT 41.3  MCV 86.0  PLT 303   Basic Metabolic Panel: Recent Labs  Lab 08/21/23 1247  NA 133*  K 4.0  CL 96*  CO2 24  GLUCOSE 404*  BUN 18  CREATININE 0.89  CALCIUM 9.8   GFR: Estimated Creatinine Clearance: 75 mL/min (by C-G formula based on SCr of 0.89 mg/dL). Liver Function Tests: Recent Labs  Lab 08/21/23 1837  AST 31  ALT 31  ALKPHOS 87  BILITOT 0.5  PROT 6.8  ALBUMIN 3.0*   No results for input(s): "LIPASE", "AMYLASE" in the last 168 hours. No results for input(s): "AMMONIA" in the last 168 hours. Coagulation Profile: No results for input(s): "INR", "PROTIME" in the last 168 hours. Cardiac Enzymes: No results for input(s): "CKTOTAL", "CKMB", "CKMBINDEX", "TROPONINI" in the last 168 hours. BNP (last 3 results) No results for input(s): "PROBNP" in the last 8760 hours. HbA1C: No results for input(s): "HGBA1C" in the last 72 hours. CBG: Recent Labs  Lab  08/21/23 1237 08/21/23 1634  GLUCAP 406* 231*   Lipid Profile: No results for input(s): "CHOL", "HDL", "LDLCALC", "TRIG", "CHOLHDL", "LDLDIRECT" in the last 72 hours. Thyroid Function Tests: No results for input(s): "TSH", "T4TOTAL", "FREET4", "T3FREE", "THYROIDAB" in the last 72 hours. Anemia Panel: No results for input(s): "VITAMINB12", "FOLATE", "FERRITIN", "TIBC", "IRON", "RETICCTPCT" in the last 72 hours. Urine analysis: No results found for: "COLORURINE", "APPEARANCEUR", "LABSPEC", "PHURINE", "GLUCOSEU", "HGBUR", "BILIRUBINUR", "KETONESUR", "PROTEINUR", "UROBILINOGEN", "NITRITE", "LEUKOCYTESUR" Radiological Exams on Admission: CT FOOT RIGHT W CONTRAST Result Date: 08/21/2023 CLINICAL DATA:  History of surgery involving the right great toe with persistent pain, swelling and open wound. Abnormal radiographs. EXAM:  CT OF THE LOWER RIGHT EXTREMITY WITH CONTRAST TECHNIQUE: Multidetector CT imaging of the lower right extremity was performed according to the standard protocol following intravenous contrast administration. RADIATION DOSE REDUCTION: This exam was performed according to the departmental dose-optimization program which includes automated exposure control, adjustment of the mA and/or kV according to patient size and/or use of iterative reconstruction technique. CONTRAST:  75mL OMNIPAQUE IOHEXOL 350 MG/ML SOLN COMPARISON:  Multiple prior radiographs. The most recent of 08/21/2023. FINDINGS: Definite significant change when when comparing the radiographs from 08/10/2023 and 08/21/2023. The CT scan confirms extensive osteolysis involving the distal phalanx of the great toe along with lucency surrounding the headless screw and marked joint space widening at the interphalangeal joint. Findings consistent with septic arthritis and osteomyelitis. I do not see any definite findings for osteomyelitis involving the first metatarsal head and the hardware in this area is intact. Associated significant  changes of cellulitis with a large open wound noted on the dorsum of the foot overlying the great toe, centered at the fused MTP joint. Diffuse fatty atrophy of the foot musculature without definite findings for pyomyositis. The other bony structures are intact. There are advanced degenerative changes involving the interphalangeal joints of the foot with areas of partial fusion. The tibiotalar and subtalar joints are maintained. No midfoot or hindfoot bony abnormalities. IMPRESSION: 1. CT scan confirms extensive osteomyelitis involving the distal phalanx of the great toe. Significant interval widening at the interphalangeal joint consistent with septic arthritis. 2. Associated significant changes of cellulitis with a large open wound noted on the dorsum of the foot overlying the great toe, centered at the fused MTP joint. 3. Diffuse fatty atrophy of the foot musculature without definite findings for pyomyositis. 4. Advanced degenerative changes involving the interphalangeal joints of the foot with areas of partial fusion. Electronically Signed   By: Rudie Meyer M.D.   On: 08/21/2023 18:39   DG Foot Complete Right Result Date: 08/21/2023 CLINICAL DATA:  Wound. EXAM: RIGHT FOOT COMPLETE - 3+ VIEW COMPARISON:  Right foot radiographs 08/10/2023. FINDINGS: Arthrodesis the first MTP joint is again noted. Progressive lucency is noted about the distal screw across the IP joint. Osteolysis is noted within the distal phalanx in the distal aspect of the proximal phalanx. Extensive edema and gas is present within the great toe. Diffuse soft tissue swelling is present over the dorsum of the foot. IMPRESSION: 1. Progressive lucency about the distal screw across the IP joint of the great toe with osteolysis within the distal phalanx and distal aspect of the proximal phalanx. Findings are concerning for osteomyelitis. 2. Extensive edema and gas within the great toe. 3. Diffuse soft tissue swelling over the dorsum of the foot.  Electronically Signed   By: Marin Roberts M.D.   On: 08/21/2023 13:58   Data Reviewed: Relevant notes from primary care and specialist visits, past discharge summaries as available in EHR, including Care Everywhere. Prior diagnostic testing as pertinent to current admission diagnoses, Updated medications and problem lists for reconciliation ED course, including vitals, labs, imaging, treatment and response to treatment,Triage notes, nursing and pharmacy notes and ED provider's notes Notable results as noted in HPI.Discussed case with EDMD/ ED APP/ or Specialty MD on call and as needed. Assessment & Plan Osteomyelitis (HCC) Pt presenting with right foot swelling, with ulcer and erythema  and Xray C/W Osteomyelitis  We will cont with iv abx. Podiatry consulted.  Optimize blood sugar control.  Hypertension Vitals:   08/21/23 1235 08/21/23 1500 08/21/23 1530  08/21/23 1635  BP: (!) 156/69 139/86 131/64 (!) 125/97   08/21/23 1921  BP: (!) 120/38  Vancomycin, rocephin and flagyl .  Type 2 diabetes mellitus (HCC) Glycemic protocol with premeal and has coverage.   GERD (gastroesophageal reflux disease) IV PPI and aspiration precaution.   DVT prophylaxis:  Heparin.  Consults:  Podiatry. Advance Care Planning:    Code Status: Full Code   Family Communication:  Daughter. Disposition Plan:  Home.  Severity of Illness: The appropriate patient status for this patient is INPATIENT. Inpatient status is judged to be reasonable and necessary in order to provide the required intensity of service to ensure the patient's safety. The patient's presenting symptoms, physical exam findings, and initial radiographic and laboratory data in the context of their chronic comorbidities is felt to place them at high risk for further clinical deterioration. Furthermore, it is not anticipated that the patient will be medically stable for discharge from the hospital within 2 midnights of admission.   * I  certify that at the point of admission it is my clinical judgment that the patient will require inpatient hospital care spanning beyond 2 midnights from the point of admission due to high intensity of service, high risk for further deterioration and high frequency of surveillance required.*  Unresulted Labs (From admission, onward)     Start     Ordered   08/22/23 0500  Comprehensive metabolic panel  Tomorrow morning,   R        08/21/23 1600   08/22/23 0500  CBC  Tomorrow morning,   R        08/21/23 1600   08/21/23 1426  Blood Cultures x 2 sites  BLOOD CULTURE X 2,   STAT      08/21/23 1426            Orders Placed This Encounter  Procedures   Procedural/ Surgical Case Request: AMPUTATION, FOOT, RAY   Blood Cultures x 2 sites   DG Foot Complete Right   CT FOOT RIGHT W CONTRAST   CBC with Differential   Basic metabolic panel   Hepatic function panel   HIV Antibody (routine testing w rflx)   Comprehensive metabolic panel   CBC   Glucose, capillary   Diet Carb Modified Fluid consistency: Thin; Room service appropriate? Yes   Diet NPO time specified   Initiate Carrier Fluid Protocol   Maintain IV access   Vital signs   Notify physician (specify)   Mobility Protocol: No Restrictions RN to initiate protocols based on patient's level of care   Refer to Sidebar Report Refer to ICU, Med-Surg, Progressive, and Step-Down Mobility Protocol Sidebars   Initiate Adult Central Line Maintenance and Catheter Protocol for patients with central line (CVC, PICC, Port, Hemodialysis, Trialysis)   Daily weights   Intake and Output   Do not place and if present remove PureWick   Initiate Oral Care Protocol   Initiate Carrier Fluid Protocol   RN may order General Admission PRN Orders utilizing "General Admission PRN medications" (through manage orders) for the following patient needs: allergy symptoms (Claritin), cold sores (Carmex), cough (Robitussin DM), eye irritation (Liquifilm Tears),  hemorrhoids (Tucks), indigestion (Maalox), minor skin irritation (Hydrocortisone Cream), muscle pain Romeo Apple Gay), nose irritation (saline nasal spray) and sore throat (Chloraseptic spray).   Cardiac Monitoring Continuous x 48 hours Indications for use: Other; Other indications for use: ? sepsis.   Apply Diabetes Mellitus Care Plan   STAT CBG when hypoglycemia is suspected. If treated,  recheck every 15 minutes after each treatment until CBG >/= 70 mg/dl   Refer to Hypoglycemia Protocol Sidebar Report for treatment of CBG < 70 mg/dl   SCD's   Initiate Pre-op Protocol   6 CHG cloth bath night before surgery   6 CHG cloth bath AM of surgery   Pre-admission testing diagnosis   Complete oral care assessment tool on admission, transfer, and q shift   Refer to Sidebar Report Adult Oral Care Protocol   Brush teeth with toothbrush and toothpaste 3 times daily   Apply moisturizer in mouth and lips prn   Full code   Consult to podiatry Consult Timeframe: STAT - requires a response within one hour; STAT timeframe requires provider to provider communication, has the provider to provider communication been completed: Yes; Reason for Consult? Dr Allena Katz patient tri...   Consult to hospitalist   Consult to Peripheral Vascular Navigator   pharmacy consult   Consult to anesthesiology Reason for Consult? Surgery   Pulse oximetry check with vital signs   Oxygen therapy Mode or (Route): Nasal cannula; Liters Per Minute: 2; Keep O2 saturation between: greater than 92 %   CBG monitoring, ED   POC CBG, ED   I-Stat CG4 Lactic Acid   I-Stat Lactic Acid, ED   Admit to Inpatient (patient's expected length of stay will be greater than 2 midnights or inpatient only procedure)   Aspiration precautions   Fall precautions   VAS [place] ABI WITH/WO TBI    Author: Gertha Calkin, MD 12 pm -8 pm. 08/21/2023 9:31 PM >>Please note for any questions,concerns,abnormal labs, or critical results past 8pm please contact the after hours  [facility] floor coverage provider from 7 PM- 7 AM. For on call review [email or web address], username [email or web address] and PW: your phone number.

## 2023-08-21 NOTE — Plan of Care (Signed)

## 2023-08-21 NOTE — Assessment & Plan Note (Addendum)
 Pt presenting with right foot swelling, with ulcer and erythema  and Xray C/W Osteomyelitis  We will cont with iv abx. Podiatry consulted.  Optimize blood sugar control.

## 2023-08-22 ENCOUNTER — Encounter (HOSPITAL_COMMUNITY)

## 2023-08-22 ENCOUNTER — Encounter (HOSPITAL_COMMUNITY)
Admission: EM | Disposition: A | Discharge status: Home Health | Payer: Self-pay | Source: Home / Self Care | Attending: Internal Medicine

## 2023-08-22 ENCOUNTER — Inpatient Hospital Stay (HOSPITAL_COMMUNITY)

## 2023-08-22 ENCOUNTER — Inpatient Hospital Stay (HOSPITAL_COMMUNITY): Admitting: Certified Registered Nurse Anesthetist

## 2023-08-22 ENCOUNTER — Encounter (HOSPITAL_COMMUNITY): Payer: Self-pay | Admitting: Internal Medicine

## 2023-08-22 ENCOUNTER — Other Ambulatory Visit: Payer: Self-pay

## 2023-08-22 DIAGNOSIS — M86171 Other acute osteomyelitis, right ankle and foot: Secondary | ICD-10-CM

## 2023-08-22 DIAGNOSIS — J45909 Unspecified asthma, uncomplicated: Secondary | ICD-10-CM | POA: Diagnosis not present

## 2023-08-22 DIAGNOSIS — M869 Osteomyelitis, unspecified: Secondary | ICD-10-CM

## 2023-08-22 DIAGNOSIS — E1169 Type 2 diabetes mellitus with other specified complication: Secondary | ICD-10-CM | POA: Diagnosis not present

## 2023-08-22 DIAGNOSIS — I10 Essential (primary) hypertension: Secondary | ICD-10-CM

## 2023-08-22 DIAGNOSIS — K219 Gastro-esophageal reflux disease without esophagitis: Secondary | ICD-10-CM | POA: Diagnosis not present

## 2023-08-22 HISTORY — PX: AMPUTATION: SHX166

## 2023-08-22 HISTORY — PX: APPLICATION OF WOUND VAC: SHX5189

## 2023-08-22 LAB — COMPREHENSIVE METABOLIC PANEL WITH GFR
ALT: 28 U/L (ref 0–44)
AST: 25 U/L (ref 15–41)
Albumin: 2.6 g/dL — ABNORMAL LOW (ref 3.5–5.0)
Alkaline Phosphatase: 75 U/L (ref 38–126)
Anion gap: 12 (ref 5–15)
BUN: 19 mg/dL (ref 8–23)
CO2: 23 mmol/L (ref 22–32)
Calcium: 9 mg/dL (ref 8.9–10.3)
Chloride: 102 mmol/L (ref 98–111)
Creatinine, Ser: 0.87 mg/dL (ref 0.44–1.00)
GFR, Estimated: >60 mL/min (ref >60)
Glucose, Bld: 141 mg/dL — ABNORMAL HIGH (ref 70–99)
Potassium: 3.7 mmol/L (ref 3.5–5.1)
Sodium: 137 mmol/L (ref 135–145)
Total Bilirubin: 0.4 mg/dL (ref 0.0–1.2)
Total Protein: 5.9 g/dL — ABNORMAL LOW (ref 6.5–8.1)

## 2023-08-22 LAB — CBC
HCT: 35.7 % — ABNORMAL LOW (ref 36.0–46.0)
Hemoglobin: 11.6 g/dL — ABNORMAL LOW (ref 12.0–15.0)
MCH: 27.8 pg (ref 26.0–34.0)
MCHC: 32.5 g/dL (ref 30.0–36.0)
MCV: 85.6 fL (ref 80.0–100.0)
Platelets: 237 10*3/uL (ref 150–400)
RBC: 4.17 MIL/uL (ref 3.87–5.11)
RDW: 13.1 % (ref 11.5–15.5)
WBC: 8.8 10*3/uL (ref 4.0–10.5)
nRBC: 0 % (ref 0.0–0.2)

## 2023-08-22 LAB — GLUCOSE, CAPILLARY
Glucose-Capillary: 111 mg/dL — ABNORMAL HIGH (ref 70–99)
Glucose-Capillary: 113 mg/dL — ABNORMAL HIGH (ref 70–99)
Glucose-Capillary: 120 mg/dL — ABNORMAL HIGH (ref 70–99)
Glucose-Capillary: 161 mg/dL — ABNORMAL HIGH (ref 70–99)
Glucose-Capillary: 222 mg/dL — ABNORMAL HIGH (ref 70–99)
Glucose-Capillary: 281 mg/dL — ABNORMAL HIGH (ref 70–99)

## 2023-08-22 SURGERY — AMPUTATION, FOOT, RAY
Anesthesia: Monitor Anesthesia Care | Site: Foot | Laterality: Right

## 2023-08-22 MED ORDER — LACTATED RINGERS IV SOLN: INTRAVENOUS | Status: DC

## 2023-08-22 MED ORDER — LIDOCAINE 2% (20 MG/ML) 5 ML SYRINGE
INTRAMUSCULAR | Status: AC
  Filled 2023-08-22: qty 5

## 2023-08-22 MED ORDER — CHLORHEXIDINE GLUCONATE 0.12 % MT SOLN: 15.0000 mL | Freq: Once | OROMUCOSAL | Status: AC

## 2023-08-22 MED ORDER — ALBUTEROL SULFATE (2.5 MG/3ML) 0.083% IN NEBU: 3.0000 mL | INHALATION_SOLUTION | Freq: Four times a day (QID) | RESPIRATORY_TRACT | Status: DC | PRN

## 2023-08-22 MED ORDER — DIPHENHYDRAMINE HCL 50 MG/ML IJ SOLN
INTRAMUSCULAR | Status: DC | PRN
  Administered 2023-08-22: 12.5 mg via INTRAVENOUS

## 2023-08-22 MED ORDER — SODIUM CHLORIDE 0.9 % IR SOLN
Status: DC | PRN
  Administered 2023-08-22: 1

## 2023-08-22 MED ORDER — CHLORHEXIDINE GLUCONATE 0.12 % MT SOLN
OROMUCOSAL | Status: AC
  Administered 2023-08-22: 15 mL via OROMUCOSAL
  Filled 2023-08-22: qty 15

## 2023-08-22 MED ORDER — TOBRAMYCIN SULFATE 80 MG/2ML IJ SOLN
INTRAMUSCULAR | Status: AC
  Filled 2023-08-22: qty 4

## 2023-08-22 MED ORDER — ONDANSETRON HCL 4 MG/2ML IJ SOLN
INTRAMUSCULAR | Status: AC
  Filled 2023-08-22: qty 2

## 2023-08-22 MED ORDER — PROPOFOL 500 MG/50ML IV EMUL
INTRAVENOUS | Status: DC | PRN
  Administered 2023-08-22: 100 ug/kg/min via INTRAVENOUS

## 2023-08-22 MED ORDER — PANTOPRAZOLE SODIUM 40 MG PO TBEC
40.0000 mg | DELAYED_RELEASE_TABLET | Freq: Every day | ORAL | Status: DC
  Administered 2023-08-22 – 2023-08-25 (×4): 40 mg via ORAL
  Filled 2023-08-22 (×4): qty 1

## 2023-08-22 MED ORDER — EPHEDRINE SULFATE (PRESSORS) 50 MG/ML IJ SOLN
INTRAMUSCULAR | Status: DC | PRN
  Administered 2023-08-22 (×2): 10 mg via INTRAVENOUS

## 2023-08-22 MED ORDER — DIPHENHYDRAMINE HCL 50 MG/ML IJ SOLN
INTRAMUSCULAR | Status: AC
  Filled 2023-08-22: qty 1

## 2023-08-22 MED ORDER — BUPIVACAINE HCL (PF) 0.5 % IJ SOLN
INTRAMUSCULAR | Status: DC | PRN
  Administered 2023-08-22: 30 mL

## 2023-08-22 MED ORDER — FENTANYL CITRATE (PF) 250 MCG/5ML IJ SOLN
INTRAMUSCULAR | Status: AC
  Filled 2023-08-22: qty 5

## 2023-08-22 MED ORDER — PROPOFOL 10 MG/ML IV BOLUS
INTRAVENOUS | Status: DC | PRN
  Administered 2023-08-22: 40 mg via INTRAVENOUS

## 2023-08-22 MED ORDER — FENTANYL CITRATE (PF) 250 MCG/5ML IJ SOLN
INTRAMUSCULAR | Status: DC | PRN
  Administered 2023-08-22: 50 ug via INTRAVENOUS
  Administered 2023-08-22: 100 ug via INTRAVENOUS

## 2023-08-22 MED ORDER — PRAVASTATIN SODIUM 10 MG PO TABS
10.0000 mg | ORAL_TABLET | Freq: Every day | ORAL | Status: DC
  Administered 2023-08-22 – 2023-08-24 (×3): 10 mg via ORAL
  Filled 2023-08-22 (×4): qty 1

## 2023-08-22 MED ORDER — TOBRAMYCIN SULFATE 80 MG/2ML IJ SOLN
INTRAMUSCULAR | Status: DC | PRN
  Administered 2023-08-22: 160 mg via INTRAMUSCULAR

## 2023-08-22 MED ORDER — FENTANYL CITRATE (PF) 100 MCG/2ML IJ SOLN: 25.0000 ug | INTRAMUSCULAR | Status: DC | PRN

## 2023-08-22 MED ORDER — HYDROCODONE-ACETAMINOPHEN 5-325 MG PO TABS
ORAL_TABLET | ORAL | Status: AC
  Filled 2023-08-22: qty 1

## 2023-08-22 MED ORDER — FLUOXETINE HCL 20 MG PO CAPS
20.0000 mg | ORAL_CAPSULE | Freq: Every day | ORAL | Status: DC
  Administered 2023-08-22 – 2023-08-25 (×4): 20 mg via ORAL
  Filled 2023-08-22 (×4): qty 1

## 2023-08-22 MED ORDER — ORAL CARE MOUTH RINSE: 15.0000 mL | Freq: Once | OROMUCOSAL | Status: AC

## 2023-08-22 MED ORDER — VANCOMYCIN HCL 500 MG IV SOLR
INTRAVENOUS | Status: AC
  Filled 2023-08-22: qty 10

## 2023-08-22 MED ORDER — VANCOMYCIN HCL 500 MG IV SOLR
INTRAVENOUS | Status: DC | PRN
  Administered 2023-08-22: 500 mg

## 2023-08-22 MED ORDER — BUPIVACAINE HCL (PF) 0.5 % IJ SOLN
INTRAMUSCULAR | Status: AC
  Filled 2023-08-22: qty 30

## 2023-08-22 SURGICAL SUPPLY — 43 items
BEADS BIO ARTH CALC SULFAT 5CC (Bone Implant) IMPLANT
BIOBEADS ARTH CALC SULFATE 5CC (Bone Implant) ×2 IMPLANT
BLADE AVERAGE 25X9 (BLADE) IMPLANT
BLADE SURG 10 STRL SS (BLADE) ×3 IMPLANT
BLADE SURG 15 STRL LF DISP TIS (BLADE) ×3 IMPLANT
BNDG COHESIVE 3X5 TAN ST LF (GAUZE/BANDAGES/DRESSINGS) ×3 IMPLANT
BNDG ELASTIC 3INX 5YD STR LF (GAUZE/BANDAGES/DRESSINGS) ×3 IMPLANT
BNDG ELASTIC 4INX 5YD STR LF (GAUZE/BANDAGES/DRESSINGS) IMPLANT
BNDG ESMARK 4X9 LF (GAUZE/BANDAGES/DRESSINGS) ×3 IMPLANT
BNDG GAUZE DERMACEA FLUFF 4 (GAUZE/BANDAGES/DRESSINGS) IMPLANT
CANISTER WOUNDNEG PRESSURE 500 (CANNISTER) IMPLANT
CHLORAPREP W/TINT 26 (MISCELLANEOUS) IMPLANT
DRSG ADAPTIC 3X8 NADH LF (GAUZE/BANDAGES/DRESSINGS) IMPLANT
DRSG VERAFLO VAC MED (GAUZE/BANDAGES/DRESSINGS) IMPLANT
DRSG XEROFORM 1X8 (GAUZE/BANDAGES/DRESSINGS) IMPLANT
ELECT REM PT RETURN 9FT ADLT (ELECTROSURGICAL) ×2 IMPLANT
ELECTRODE REM PT RTRN 9FT ADLT (ELECTROSURGICAL) ×3 IMPLANT
GAUZE PAD ABD 8X10 STRL (GAUZE/BANDAGES/DRESSINGS) IMPLANT
GAUZE SPONGE 2X2 STRL 8-PLY (GAUZE/BANDAGES/DRESSINGS) IMPLANT
GAUZE SPONGE 4X4 12PLY STRL (GAUZE/BANDAGES/DRESSINGS) ×3 IMPLANT
GAUZE STRETCH 2X75IN STRL (MISCELLANEOUS) ×3 IMPLANT
GAUZE XEROFORM 1X8 LF (GAUZE/BANDAGES/DRESSINGS) ×3 IMPLANT
GLOVE BIO SURGEON STRL SZ7.5 (GLOVE) ×3 IMPLANT
GLOVE BIOGEL PI IND STRL 7.5 (GLOVE) ×3 IMPLANT
GOWN STRL REUS W/ TWL LRG LVL3 (GOWN DISPOSABLE) ×6 IMPLANT
KIT BASIN OR (CUSTOM PROCEDURE TRAY) ×3 IMPLANT
NDL BIOPSY JAMSHIDI 8X6 (NEEDLE) IMPLANT
NDL HYPO 25X1 1.5 SAFETY (NEEDLE) ×3 IMPLANT
NEEDLE BIOPSY JAMSHIDI 8X6 (NEEDLE) IMPLANT
NEEDLE HYPO 25X1 1.5 SAFETY (NEEDLE) ×2 IMPLANT
PACK ORTHO EXTREMITY (CUSTOM PROCEDURE TRAY) ×3 IMPLANT
PADDING CAST ABS COTTON 4X4 ST (CAST SUPPLIES) ×6 IMPLANT
SET HNDPC FAN SPRY TIP SCT (DISPOSABLE) IMPLANT
SPIKE FLUID TRANSFER (MISCELLANEOUS) IMPLANT
STOCKINETTE 4X48 STRL (DRAPES) IMPLANT
SUT ETHILON 3 0 FSLX (SUTURE) IMPLANT
SUT PROLENE 3 0 PS 2 (SUTURE) IMPLANT
SUT PROLENE 4 0 PS 2 18 (SUTURE) IMPLANT
SYR CONTROL 10ML LL (SYRINGE) ×3 IMPLANT
TUBE CONNECTING 12X1/4 (SUCTIONS) IMPLANT
UNDERPAD 30X36 HEAVY ABSORB (UNDERPADS AND DIAPERS) ×3 IMPLANT
WATER STERILE IRR 1000ML POUR (IV SOLUTION) ×3 IMPLANT
YANKAUER SUCT BULB TIP NO VENT (SUCTIONS) IMPLANT

## 2023-08-22 NOTE — Op Note (Signed)
 Full Operative Report  Date of Operation: 2:53 PM, 08/22/2023   Patient: Amy Garner - 68 y.o. female  Surgeon: Pilar Plate, DPM   Assistant: None  Diagnosis: osteomyelitis 1st ray right foot  Procedure:  1. Partial first ray amputation, right foot 2. Application dissolvable antibiotic beads 3. Wound vac application 7 x 3 cm right foot    Anesthesia: Monitor Anesthesia Care  No responsible provider has been recorded for the case.  Anesthesiologist: Val Eagle, MD CRNA: Cy Blamer, CRNA; Darryl Nestle, CRNA   Estimated Blood Loss: Minimal ***  Hemostasis: 1) Anatomical dissection, mechanical compression, electrocautery 2) ***  Implants: Implant Name Type Inv. Item Serial No. Manufacturer Lot No. LRB No. Used Action  BIOBEADS ARTH CALC SULFATE 5CC - [id number] Bone Implant BIOBEADS ARTH CALC SULFATE 5CC  [employer]  Right 1 Implanted    Materials: ***  Injectables: 1) Pre-operatively: *** cc of 50:50 mixture 1%lidocaine plain and 0.5% marcaine plain 2) Post-operatively: None ***  Specimens: Pathology: ***  Microbiology: ***   Antibiotics: IV antibiotics given per schedule on the floor  Drains: None  Complications: Patient tolerated the procedure well without complication.   Operative findings: As below in detailed report  Indications for Procedure: Amy Garner presents to Pilar Plate, DPM with a chief complaint of *** The patient has failed conservative treatments of various modalities. At this time the patient has elected to proceed with surgical correction. All alternatives, risks, and complications of the procedures were thoroughly explained to the patient. Patient exhibits appropriate understanding of all discussion points and informed consent was signed and obtained in the chart with no guarantees to surgical outcome given or implied.  Description of Procedure: Patient was brought to the operating room. Patient  remained on their hospital bed in the supine position. A surgical timeout was performed and all members of the operating room, the procedure, and the surgical site were identified. anesthesia occurred as per anesthesia record. Local anesthetic as previously described was then injected about the operative field in a local infiltrative block.  The operative lower extremity as noted above was then prepped and draped in the usual sterile manner. The following procedure then began.  ***  The surgical site was then dressed with ***. The patient tolerated both the procedure and anesthesia well with vital signs stable throughout. The patient was transferred in good condition and all vital signs stable  from the OR to recovery under the discretion of anesthesia.  Condition: Vital signs stable, neurovascular status unchanged from preoperative   Surgical plan:  ***  The patient will be *** in a *** to the operative limb until further instructed. The dressing is to remain clean, dry, and intact. Will continue to follow unless noted elsewhere.   Carlena Hurl, DPM [facility]

## 2023-08-22 NOTE — Brief Op Note (Signed)
 08/22/2023  3:15 PM  PATIENT:  [name]  68 y.o. female  PRE-OPERATIVE DIAGNOSIS:  osteomyelitis  POST-OPERATIVE DIAGNOSIS:  osteomyelitis  PROCEDURE:  Procedure(s): AMPUTATION, FOOT, RAY (Right) APPLICATION, WOUND VAC (Right)  SURGEON:  Surgeons and Role:    * Kaiyana Bedore, Jenelle Mages, DPM - Primary  PHYSICIAN ASSISTANT:   ASSISTANTS: none   ANESTHESIA:   local and MAC  EBL:  10 mL   BLOOD ADMINISTERED:none  DRAINS: none   LOCAL MEDICATIONS USED:  MARCAINE     SPECIMEN:  Source of Specimen:  Right great toe/first met head and right 1st met proximal margin;  tissue culture ulceration 1st met and bone culture proximal phalanx  DISPOSITION OF SPECIMEN:   Path and micro  COUNTS:  YES  TOURNIQUET:  * No tourniquets in log *  DICTATION: .Note written in EPIC  PLAN OF CARE: Admit to inpatient   PATIENT DISPOSITION:  PACU - hemodynamically stable.   Delay start of Pharmacological VTE agent (>24hrs) due to surgical blood loss or risk of bleeding: yes

## 2023-08-22 NOTE — Anesthesia Preprocedure Evaluation (Addendum)
 Anesthesia Evaluation  Patient identified by MRN, date of birth, ID band Patient awake    Reviewed: Allergy & Precautions, NPO status , Patient's Chart, lab work & pertinent test results  Airway Mallampati: II  TM Distance: >3 FB Neck ROM: Full    Dental  (+) Edentulous Upper, Edentulous Lower   Pulmonary asthma , former smoker   breath sounds clear to auscultation       Cardiovascular hypertension,  Rhythm:Regular     Neuro/Psych   Anxiety Depression    negative neurological ROS     GI/Hepatic Neg liver ROS, PUD,GERD  ,,  Endo/Other  diabetes, Type 2, Insulin Dependent, Oral Hypoglycemic Agents    Renal/GU negative Renal ROS  negative genitourinary   Musculoskeletal  (+) Arthritis , Osteoarthritis,  Osteomyelitis foot   Abdominal   Peds  Hematology Lab Results      Component                Value               Date                      WBC                      8.8                 08/22/2023                HGB                      11.6 (L)            08/22/2023                HCT                      35.7 (L)            08/22/2023                MCV                      85.6                08/22/2023                PLT                      237                 08/22/2023              Anesthesia Other Findings   Reproductive/Obstetrics                              Anesthesia Physical Anesthesia Plan  ASA: 3  Anesthesia Plan: MAC   Post-op Pain Management:    Induction: Intravenous  PONV Risk Score and Plan: 2 and Dexamethasone, Ondansetron, Propofol infusion and Treatment may vary due to age or medical condition  Airway Management Planned: Simple Face Mask and Nasal Cannula  Additional Equipment: None  Intra-op Plan:   Post-operative Plan:   Informed Consent: I have reviewed the patients History and Physical, chart, labs and discussed the procedure including the risks, benefits  and alternatives for the proposed anesthesia with the patient or  authorized representative who has indicated his/her understanding and acceptance.     Dental advisory given  Plan Discussed with: CRNA  Anesthesia Plan Comments:        Anesthesia Quick Evaluation

## 2023-08-22 NOTE — Transfer of Care (Signed)
 Immediate Anesthesia Transfer of Care Note  Patient: Amy Garner  Procedure(s) Performed: AMPUTATION, FOOT, RAY (Right) APPLICATION, WOUND VAC (Right: Foot)  Patient Location: PACU  Anesthesia Type:MAC  Level of Consciousness: awake, alert , and oriented  Airway & Oxygen Therapy: Patient Spontanous Breathing  Post-op Assessment: Report given to RN, Post -op Vital signs reviewed and stable, Patient moving all extremities X 4, and Patient able to stick tongue midline  Post vital signs: Reviewed and stable  Last Vitals:  Vitals Value Taken Time  BP 111/61 08/22/23 1439  Temp 98.6   Pulse 71 08/22/23 1441  Resp 17 08/22/23 1441  SpO2 95 % 08/22/23 1441  Vitals shown include unfiled device data.  Last Pain:  Vitals:   08/22/23 1256  TempSrc:   PainSc: 7       Patients Stated Pain Goal: 3 (08/22/23 1256)  Complications: No notable events documented.

## 2023-08-22 NOTE — Progress Notes (Signed)
 PROGRESS NOTE    Amy Garner  NUU:[id number] DOB: Sep 24, 1955 DOA: 08/21/2023 PCP: Georganna Skeans, MD   Brief Narrative:  Amy Garner is a 68 y.o. female with past medical history diabetes mellitus type 2, essential hypertension, peptic ulcer disease, GERD, depression, hyperlipidemia, anxiety, asthma, osteoarthritis of the first MTP presented with pain and erythema swelling initially on the dorsum of her foot that eventually ruptured leaving an ulcer and drainage where she had surgery.  No reports of chest pain shortness of breath headaches dizziness falls.  Podiatry on board.  Assessment & Plan:   Active Problems:   Hypertension   Peptic ulcer   Type 2 diabetes mellitus (HCC)   GERD (gastroesophageal reflux disease)   Osteomyelitis (HCC)  Diabetic wound and osteomyelitis involving distal phalanx of the right great toe/cellulitis, POA: Started on broad-spectrum antibiotics including Rocephin, Flagyl and vancomycin, seen by podiatry, surgery offered, patient wanted to think overnight, patient now wants to know final CT results and she has been placed n.p.o. for anticipated surgery today, defer to podiatry for definitive therapy.  Type 2 diabetes mellitus: PTA meds includes Lantus 62 units daily and metformin.  Currently she is only on SSI.  Was hyperglycemic yesterday but blood sugar much better today so I will continue to monitor and manage with SSI only and add long-acting insulin if she becomes hyperglycemic.  Hyperlipidemia: Will resume lovastatin once returns from the surgery.  Essential hypertension: PTA losartan and Dyazide which is on hold and blood pressure controlled.  GERD: Continue PPI.  Anxiety/depression: Resume Prozac.  DVT prophylaxis: heparin injection 5,000 Units Start: 08/21/23 2200 SCD's Start: 08/21/23 1711   Code Status: Full Code  Family Communication:  None present at bedside.  Plan of care discussed with patient in length and he/she verbalized  understanding and agreed with it.  Status is: Inpatient Remains inpatient appropriate because: Osteomyelitis, needs amputation.   Estimated body mass index is 38.18 kg/m as calculated from the following:   Height as of this encounter: 5\' 6"  (1.676 m).   Weight as of this encounter: 107.3 kg.    Nutritional Assessment: Body mass index is 38.18 kg/m.Marland Kitchen Seen by dietician.  I agree with the assessment and plan as outlined below: Nutrition Status:        . Skin Assessment: I have examined the patient's skin and I agree with the wound assessment as performed by the wound care RN as outlined below:    Consultants:  Podiatry  Procedures:  As above  Antimicrobials:  Anti-infectives (From admission, onward)    Start     Dose/Rate Route Frequency Ordered Stop   08/22/23 1800  vancomycin (VANCOREADY) IVPB 1250 mg/250 mL        1,250 mg 166.7 mL/hr over 90 Minutes Intravenous Every 24 hours 08/21/23 1625     08/22/23 1400  cefTRIAXone (ROCEPHIN) 2 g in sodium chloride 0.9 % 100 mL IVPB        2 g 200 mL/hr over 30 Minutes Intravenous Every 24 hours 08/21/23 1623     08/22/23 0200  metroNIDAZOLE (FLAGYL) IVPB 500 mg        500 mg 100 mL/hr over 60 Minutes Intravenous Every 12 hours 08/21/23 1623     08/21/23 1430  cefTRIAXone (ROCEPHIN) 2 g in sodium chloride 0.9 % 100 mL IVPB  Status:  Discontinued       Placed in "And" Linked Group   2 g 200 mL/hr over 30 Minutes Intravenous Once 08/21/23 1426 08/21/23 1429  08/21/23 1430  metroNIDAZOLE (FLAGYL) IVPB 500 mg  Status:  Discontinued       Placed in "And" Linked Group   500 mg 100 mL/hr over 60 Minutes Intravenous  Once 08/21/23 1426 08/21/23 1429   08/21/23 1430  vancomycin (VANCOCIN) IVPB 1000 mg/200 mL premix  Status:  Discontinued       Placed in "And" Linked Group   1,000 mg 200 mL/hr over 60 Minutes Intravenous  Once 08/21/23 1426 08/21/23 1429   08/21/23 1430  vancomycin (VANCOREADY) IVPB 1500 mg/300 mL       Placed  in "And" Linked Group   1,500 mg 150 mL/hr over 120 Minutes Intravenous  Once 08/21/23 1429 08/21/23 1718   08/21/23 1430  cefTRIAXone (ROCEPHIN) 2 g in sodium chloride 0.9 % 100 mL IVPB       Placed in "And" Linked Group   2 g 200 mL/hr over 30 Minutes Intravenous Once 08/21/23 1429 08/21/23 1616   08/21/23 1430  metroNIDAZOLE (FLAGYL) IVPB 500 mg       Placed in "And" Linked Group   500 mg 100 mL/hr over 60 Minutes Intravenous  Once 08/21/23 1429 08/21/23 1622         Subjective: Patient seen and examined.  Other than right foot pain, no complaints.  Objective: Vitals:   08/21/23 1709 08/21/23 1921 08/22/23 0424 08/22/23 0729  BP:  (!) 120/38 111/72 126/66  Pulse:  71 (!) 57 (!) 55  Resp:  17 18 18   Temp:  97.9 F (36.6 C) 98.6 F (37 C) 98.3 F (36.8 C)  TempSrc:  Oral Oral Oral  SpO2:  97% 97% 97%  Weight: 107.3 kg     Height: 5\' 6"  (1.676 m)       Intake/Output Summary (Last 24 hours) at 08/22/2023 1058 Last data filed at 08/22/2023 0424 Gross per 24 hour  Intake 1274 ml  Output --  Net 1274 ml   Filed Weights   08/21/23 1242 08/21/23 1709  Weight: 111.6 kg 107.3 kg    Examination:  General exam: Appears calm and comfortable  Respiratory system: Clear to auscultation. Respiratory effort normal. Cardiovascular system: S1 & S2 heard, RRR. No JVD, murmurs, rubs, gallops or clicks. No pedal edema. Gastrointestinal system: Abdomen is nondistended, soft and nontender. No organomegaly or masses felt. Normal bowel sounds heard. Central nervous system: Alert and oriented. No focal neurological deficits. Extremities: Symmetric 5 x 5 power. Skin: Has dressing in the right foot. Psychiatry: Judgement and insight appear normal. Mood & affect appropriate.    Data Reviewed: I have personally reviewed following labs and imaging studies  CBC: Recent Labs  Lab 08/21/23 1247 08/22/23 0625  WBC 10.7* 8.8  NEUTROABS 6.5  --   HGB 13.6 11.6*  HCT 41.3 35.7*  MCV 86.0  85.6  PLT 303 237   Basic Metabolic Panel: Recent Labs  Lab 08/21/23 1247 08/22/23 0625  NA 133* 137  K 4.0 3.7  CL 96* 102  CO2 24 23  GLUCOSE 404* 141*  BUN 18 19  CREATININE 0.89 0.87  CALCIUM 9.8 9.0   GFR: Estimated Creatinine Clearance: 76.7 mL/min (by C-G formula based on SCr of 0.87 mg/dL). Liver Function Tests: Recent Labs  Lab 08/21/23 1837 08/22/23 0625  AST 31 25  ALT 31 28  ALKPHOS 87 75  BILITOT 0.5 0.4  PROT 6.8 5.9*  ALBUMIN 3.0* 2.6*   No results for input(s): "LIPASE", "AMYLASE" in the last 168 hours. No results for input(s): "AMMONIA"  in the last 168 hours. Coagulation Profile: No results for input(s): "INR", "PROTIME" in the last 168 hours. Cardiac Enzymes: No results for input(s): "CKTOTAL", "CKMB", "CKMBINDEX", "TROPONINI" in the last 168 hours. BNP (last 3 results) No results for input(s): "PROBNP" in the last 8760 hours. HbA1C: No results for input(s): "HGBA1C" in the last 72 hours. CBG: Recent Labs  Lab 08/21/23 1237 08/21/23 1634 08/21/23 2237 08/22/23 0727  GLUCAP 406* 231* 202* 111*   Lipid Profile: No results for input(s): "CHOL", "HDL", "LDLCALC", "TRIG", "CHOLHDL", "LDLDIRECT" in the last 72 hours. Thyroid Function Tests: No results for input(s): "TSH", "T4TOTAL", "FREET4", "T3FREE", "THYROIDAB" in the last 72 hours. Anemia Panel: No results for input(s): "VITAMINB12", "FOLATE", "FERRITIN", "TIBC", "IRON", "RETICCTPCT" in the last 72 hours. Sepsis Labs: Recent Labs  Lab 08/21/23 1253 08/21/23 1516  LATICACIDVEN 2.2* 2.5*    Recent Results (from the past 240 hours)  Blood Cultures x 2 sites     Status: None (Preliminary result)   Collection Time: 08/21/23  2:26 PM   Specimen: BLOOD RIGHT ARM  Result Value Ref Range Status   Specimen Description BLOOD RIGHT ARM  Final   Special Requests   Final    BOTTLES DRAWN AEROBIC AND ANAEROBIC Blood Culture adequate volume   Culture   Final    NO GROWTH < 24 HOURS Performed  at [facility], [address], [place], [place] [address]    Report Status PENDING  Incomplete  Blood Cultures x 2 sites     Status: None (Preliminary result)   Collection Time: 08/21/23  2:31 PM   Specimen: BLOOD RIGHT FOREARM  Result Value Ref Range Status   Specimen Description BLOOD RIGHT FOREARM  Final   Special Requests   Final    BOTTLES DRAWN AEROBIC AND ANAEROBIC Blood Culture adequate volume   Culture   Final    NO GROWTH < 24 HOURS Performed at [facility], [address], [place], [place] [address]    Report Status PENDING  Incomplete     Radiology Studies: CT FOOT RIGHT W CONTRAST Result Date: 08/21/2023 CLINICAL DATA:  History of surgery involving the right great toe with persistent pain, swelling and open wound. Abnormal radiographs. EXAM: CT OF THE LOWER RIGHT EXTREMITY WITH CONTRAST TECHNIQUE: Multidetector CT imaging of the lower right extremity was performed according to the standard protocol following intravenous contrast administration. RADIATION DOSE REDUCTION: This exam was performed according to the departmental dose-optimization program which includes automated exposure control, adjustment of the mA and/or kV according to patient size and/or use of iterative reconstruction technique. CONTRAST:  75mL OMNIPAQUE IOHEXOL 350 MG/ML SOLN COMPARISON:  Multiple prior radiographs. The most recent of 08/21/2023. FINDINGS: Definite significant change when when comparing the radiographs from 08/10/2023 and 08/21/2023. The CT scan confirms extensive osteolysis involving the distal phalanx of the great toe along with lucency surrounding the headless screw and marked joint space widening at the interphalangeal joint. Findings consistent with septic arthritis and osteomyelitis. I do not see any definite findings for osteomyelitis involving the first metatarsal head and the hardware in this area is intact. Associated significant changes of cellulitis with a large open wound  noted on the dorsum of the foot overlying the great toe, centered at the fused MTP joint. Diffuse fatty atrophy of the foot musculature without definite findings for pyomyositis. The other bony structures are intact. There are advanced degenerative changes involving the interphalangeal joints of the foot with areas of partial fusion. The  tibiotalar and subtalar joints are maintained. No midfoot or hindfoot bony abnormalities. IMPRESSION: 1. CT scan confirms extensive osteomyelitis involving the distal phalanx of the great toe. Significant interval widening at the interphalangeal joint consistent with septic arthritis. 2. Associated significant changes of cellulitis with a large open wound noted on the dorsum of the foot overlying the great toe, centered at the fused MTP joint. 3. Diffuse fatty atrophy of the foot musculature without definite findings for pyomyositis. 4. Advanced degenerative changes involving the interphalangeal joints of the foot with areas of partial fusion. Electronically Signed   By: Rudie Meyer M.D.   On: 08/21/2023 18:39   DG Foot Complete Right Result Date: 08/21/2023 CLINICAL DATA:  Wound. EXAM: RIGHT FOOT COMPLETE - 3+ VIEW COMPARISON:  Right foot radiographs 08/10/2023. FINDINGS: Arthrodesis the first MTP joint is again noted. Progressive lucency is noted about the distal screw across the IP joint. Osteolysis is noted within the distal phalanx in the distal aspect of the proximal phalanx. Extensive edema and gas is present within the great toe. Diffuse soft tissue swelling is present over the dorsum of the foot. IMPRESSION: 1. Progressive lucency about the distal screw across the IP joint of the great toe with osteolysis within the distal phalanx and distal aspect of the proximal phalanx. Findings are concerning for osteomyelitis. 2. Extensive edema and gas within the great toe. 3. Diffuse soft tissue swelling over the dorsum of the foot. Electronically Signed   By: Marin Roberts M.D.   On: 08/21/2023 13:58    Scheduled Meds:  heparin  5,000 Units Subcutaneous Q12H   insulin aspart  0-15 Units Subcutaneous TID WC   insulin aspart  0-5 Units Subcutaneous QHS   losartan  25 mg Oral Daily   sodium chloride flush  3 mL Intravenous Q12H   triamterene-hydrochlorothiazide  1 tablet Oral Daily   Continuous Infusions:  sodium chloride 75 mL/hr at 08/22/23 0934   cefTRIAXone (ROCEPHIN)  IV     lactated ringers     metronidazole 500 mg (08/22/23 0157)   vancomycin       LOS: 1 day   Hughie Closs, MD [facility]  08/22/2023, 10:58 AM   *Please note that this is a verbal dictation therefore any spelling or grammatical errors are due to the "[employer]agon [employer] One" system interpretation.  Please page via Amion and do not message via secure chat for urgent patient care matters. Secure chat can be used for non urgent patient care matters.  How to contact the [facility] Attending or Consulting provider 7A - 7P or covering provider during after hours 7P -7A, for this patient?  Check the care team in [facility] and look for a) attending/consulting [facility] provider listed and b) the [facility] team listed. Page or secure chat 7A-7P. Log into [email or web address] and use [place]'s universal password to access. If you do not have the password, please contact the hospital operator. Locate the [facility] provider you are looking for under [facility] and page to a number that you can be directly reached. If you still have difficulty reaching the provider, please page the [facility] (Director on Call) for the Hospitalists listed on amion for assistance.

## 2023-08-22 NOTE — Progress Notes (Signed)
 History and Physical Interval Note:  08/22/2023 1:29 PM  Amy Garner  has presented today for surgery, with the diagnosis of osteomyelitis of right great toe and septic first MPJ.  The various methods of treatment have been discussed with the patient and family. After consideration of risks, benefits and other options for treatment, the patient has consented to   Procedure(s): AMPUTATION, FOOT, RAY (Right) with antibiotic bead and wound VAC application as a surgical intervention.  The patient's history has been reviewed, patient examined, no change in status, stable for surgery.  I have reviewed the patient's chart and labs.  Questions were answered to the patient's satisfaction.     Jenelle Mages Cora Stetson

## 2023-08-22 NOTE — Progress Notes (Signed)
 Orthopedic Tech Progress Note Patient Details:  Amy Garner 21-Sep-1955 [id number]  Ortho Devices Type of Ortho Device: Postop shoe/boot Ortho Device/Splint Location: RLE Ortho Device/Splint Interventions: Ordered, Other (comment)   Post Interventions Patient Tolerated: Well Instructions Provided: Care of device  Donald Pore 08/22/2023, 3:25 PM

## 2023-08-23 ENCOUNTER — Inpatient Hospital Stay (HOSPITAL_COMMUNITY)

## 2023-08-23 DIAGNOSIS — M869 Osteomyelitis, unspecified: Secondary | ICD-10-CM | POA: Diagnosis not present

## 2023-08-23 DIAGNOSIS — L97909 Non-pressure chronic ulcer of unspecified part of unspecified lower leg with unspecified severity: Secondary | ICD-10-CM

## 2023-08-23 DIAGNOSIS — I10 Essential (primary) hypertension: Secondary | ICD-10-CM | POA: Diagnosis not present

## 2023-08-23 DIAGNOSIS — K279 Peptic ulcer, site unspecified, unspecified as acute or chronic, without hemorrhage or perforation: Secondary | ICD-10-CM | POA: Diagnosis not present

## 2023-08-23 LAB — GLUCOSE, CAPILLARY
Glucose-Capillary: 174 mg/dL — ABNORMAL HIGH (ref 70–99)
Glucose-Capillary: 232 mg/dL — ABNORMAL HIGH (ref 70–99)
Glucose-Capillary: 202 mg/dL — ABNORMAL HIGH (ref 70–99)
Glucose-Capillary: 287 mg/dL — ABNORMAL HIGH (ref 70–99)
Glucose-Capillary: 362 mg/dL — ABNORMAL HIGH (ref 70–99)

## 2023-08-23 LAB — VAS US ABI WITH/WO TBI
Left ABI: 0.86
Right ABI: 0.97

## 2023-08-23 MED ORDER — INSULIN GLARGINE-YFGN 100 UNIT/ML ~~LOC~~ SOLN
20.0000 [IU] | Freq: Every day | SUBCUTANEOUS | Status: DC
  Administered 2023-08-23: 20 [IU] via SUBCUTANEOUS
  Filled 2023-08-23 (×2): qty 0.2

## 2023-08-23 MED ORDER — INSULIN GLARGINE 100 UNITS/ML SOLOSTAR PEN: 20.0000 [IU] | PEN_INJECTOR | SUBCUTANEOUS | Status: DC

## 2023-08-23 MED ORDER — DOCUSATE SODIUM 100 MG PO CAPS
100.0000 mg | ORAL_CAPSULE | Freq: Two times a day (BID) | ORAL | Status: DC
  Administered 2023-08-23 – 2023-08-25 (×4): 100 mg via ORAL
  Filled 2023-08-23 (×4): qty 1

## 2023-08-23 NOTE — Progress Notes (Signed)
 PROGRESS NOTE    Amy Garner  ZOX:[id number] DOB: [birth year] DOA: 08/21/2023 PCP: Georganna Skeans, MD   Brief Narrative:  Amy Garner is a 68 y.o. female with past medical history diabetes mellitus type 2, essential hypertension, peptic ulcer disease, GERD, depression, hyperlipidemia, anxiety, asthma, osteoarthritis of the first MTP presented with pain and erythema swelling initially on the dorsum of her foot that eventually ruptured leaving an ulcer and drainage where she had surgery.  No reports of chest pain shortness of breath headaches dizziness falls.  Podiatry on board.  Assessment & Plan:   Active Problems:   Hypertension   Peptic ulcer   Type 2 diabetes mellitus (HCC)   GERD (gastroesophageal reflux disease)   Osteomyelitis (HCC)   Osteomyelitis of toe of right foot (HCC)  Diabetic wound and osteomyelitis involving distal phalanx of the right great toe/cellulitis, POA: Started on broad-spectrum antibiotics including Rocephin, Flagyl and vancomycin, seen by podiatry, underwent right foot reimplantation with application of the wound VAC on 08/22/2023.  Discussed with podiatry, plan to take her to the OR tomorrow for another washout and antibiotic beads placement.  Continue current management.  Type 2 diabetes mellitus: PTA meds includes Lantus 62 units daily and metformin.  Currently she is only on SSI, blood sugar only slightly elevated, will start on Lantus 20 units for now.  Hyperlipidemia: Continue statin.  Essential hypertension: PTA losartan and Dyazide which is on hold and blood pressure controlled.  GERD: Continue PPI.  Anxiety/depression: Resume Prozac.  DVT prophylaxis: heparin injection 5,000 Units Start: 08/21/23 2200   Code Status: Full Code  Family Communication:  None present at bedside.  Plan of care discussed with patient in length and he/she verbalized understanding and agreed with it.  Status is: Inpatient Remains inpatient appropriate because:  Osteomyelitis, needs another washout tomorrow morning.   Estimated body mass index is 39.22 kg/m as calculated from the following:   Height as of this encounter: 5\' 6"  (1.676 m).   Weight as of this encounter: 110.2 kg.    Nutritional Assessment: Body mass index is 39.22 kg/m.Marland Kitchen Seen by dietician.  I agree with the assessment and plan as outlined below: Nutrition Status:        . Skin Assessment: I have examined the patient's skin and I agree with the wound assessment as performed by the wound care RN as outlined below:    Consultants:  Podiatry  Procedures:  As above  Antimicrobials:  Anti-infectives (From admission, onward)    Start     Dose/Rate Route Frequency Ordered Stop   08/22/23 1800  vancomycin (VANCOREADY) IVPB 1250 mg/250 mL        1,250 mg 166.7 mL/hr over 90 Minutes Intravenous Every 24 hours 08/21/23 1625     08/22/23 1400  cefTRIAXone (ROCEPHIN) 2 g in sodium chloride 0.9 % 100 mL IVPB        2 g 200 mL/hr over 30 Minutes Intravenous Every 24 hours 08/21/23 1623     08/22/23 1346  tobramycin (NEBCIN) injection  Status:  Discontinued          As needed 08/22/23 1348 08/22/23 1438   08/22/23 1345  vancomycin (VANCOCIN) powder  Status:  Discontinued          As needed 08/22/23 1346 08/22/23 1438   08/22/23 0200  metroNIDAZOLE (FLAGYL) IVPB 500 mg        500 mg 100 mL/hr over 60 Minutes Intravenous Every 12 hours 08/21/23 1623     08/21/23  1430  cefTRIAXone (ROCEPHIN) 2 g in sodium chloride 0.9 % 100 mL IVPB  Status:  Discontinued       Placed in "And" Linked Group   2 g 200 mL/hr over 30 Minutes Intravenous Once 08/21/23 1426 08/21/23 1429   08/21/23 1430  metroNIDAZOLE (FLAGYL) IVPB 500 mg  Status:  Discontinued       Placed in "And" Linked Group   500 mg 100 mL/hr over 60 Minutes Intravenous  Once 08/21/23 1426 08/21/23 1429   08/21/23 1430  vancomycin (VANCOCIN) IVPB 1000 mg/200 mL premix  Status:  Discontinued       Placed in "And" Linked Group    1,000 mg 200 mL/hr over 60 Minutes Intravenous  Once 08/21/23 1426 08/21/23 1429   08/21/23 1430  vancomycin (VANCOREADY) IVPB 1500 mg/300 mL       Placed in "And" Linked Group   1,500 mg 150 mL/hr over 120 Minutes Intravenous  Once 08/21/23 1429 08/21/23 1718   08/21/23 1430  cefTRIAXone (ROCEPHIN) 2 g in sodium chloride 0.9 % 100 mL IVPB       Placed in "And" Linked Group   2 g 200 mL/hr over 30 Minutes Intravenous Once 08/21/23 1429 08/21/23 1616   08/21/23 1430  metroNIDAZOLE (FLAGYL) IVPB 500 mg       Placed in "And" Linked Group   500 mg 100 mL/hr over 60 Minutes Intravenous  Once 08/21/23 1429 08/21/23 1622         Subjective: Patient seen and examined.  Other than right foot pain, no other complaint.  She is just curious about her culture reports, I told her that they are not finalized yet.  Objective: Vitals:   08/22/23 1535 08/22/23 1656 08/22/23 1944 08/23/23 0700  BP: (!) 151/67 (!) 119/54 (!) 105/30 (!) (P) 109/90  Pulse: 68 61 75 (P) 96  Resp:  18 18 (P) 17  Temp: 97.7 F (36.5 C) 99 F (37.2 C) 98.3 F (36.8 C) (P) 98.9 F (37.2 C)  TempSrc: Oral Oral  (P) Tympanic  SpO2: 95% 95% 94%   Weight:      Height:        Intake/Output Summary (Last 24 hours) at 08/23/2023 0830 Last data filed at 08/23/2023 0400 Gross per 24 hour  Intake 860 ml  Output 10 ml  Net 850 ml   Filed Weights   08/21/23 1242 08/21/23 1709 08/22/23 1245  Weight: 111.6 kg 107.3 kg 110.2 kg    Examination:  General exam: Appears calm and comfortable  Respiratory system: Clear to auscultation. Respiratory effort normal. Cardiovascular system: S1 & S2 heard, RRR. No JVD, murmurs, rubs, gallops or clicks. No pedal edema. Gastrointestinal system: Abdomen is nondistended, soft and nontender. No organomegaly or masses felt. Normal bowel sounds heard. Central nervous system: Alert and oriented. No focal neurological deficits. Extremities: Symmetric 5 x 5 power. Skin: Has dressing and  wound VAC attached to the right foot. Psychiatry: Judgement and insight appear normal. Mood & affect appropriate.    Data Reviewed: I have personally reviewed following labs and imaging studies  CBC: Recent Labs  Lab 08/21/23 1247 08/22/23 0625  WBC 10.7* 8.8  NEUTROABS 6.5  --   HGB 13.6 11.6*  HCT 41.3 35.7*  MCV 86.0 85.6  PLT 303 237   Basic Metabolic Panel: Recent Labs  Lab 08/21/23 1247 08/22/23 0625  NA 133* 137  K 4.0 3.7  CL 96* 102  CO2 24 23  GLUCOSE 404* 141*  BUN 18  19  CREATININE 0.89 0.87  CALCIUM 9.8 9.0   GFR: Estimated Creatinine Clearance: 77.9 mL/min (by C-G formula based on SCr of 0.87 mg/dL). Liver Function Tests: Recent Labs  Lab 08/21/23 1837 08/22/23 0625  AST 31 25  ALT 31 28  ALKPHOS 87 75  BILITOT 0.5 0.4  PROT 6.8 5.9*  ALBUMIN 3.0* 2.6*   No results for input(s): "LIPASE", "AMYLASE" in the last 168 hours. No results for input(s): "AMMONIA" in the last 168 hours. Coagulation Profile: No results for input(s): "INR", "PROTIME" in the last 168 hours. Cardiac Enzymes: No results for input(s): "CKTOTAL", "CKMB", "CKMBINDEX", "TROPONINI" in the last 168 hours. BNP (last 3 results) No results for input(s): "PROBNP" in the last 8760 hours. HbA1C: No results for input(s): "HGBA1C" in the last 72 hours. CBG: Recent Labs  Lab 08/22/23 1440 08/22/23 1655 08/22/23 1943 08/22/23 2342 08/23/23 0434  GLUCAP 120* 161* 281* 222* 174*   Lipid Profile: No results for input(s): "CHOL", "HDL", "LDLCALC", "TRIG", "CHOLHDL", "LDLDIRECT" in the last 72 hours. Thyroid Function Tests: No results for input(s): "TSH", "T4TOTAL", "FREET4", "T3FREE", "THYROIDAB" in the last 72 hours. Anemia Panel: No results for input(s): "VITAMINB12", "FOLATE", "FERRITIN", "TIBC", "IRON", "RETICCTPCT" in the last 72 hours. Sepsis Labs: Recent Labs  Lab 08/21/23 1253 08/21/23 1516  LATICACIDVEN 2.2* 2.5*    Recent Results (from the past 240 hours)  Blood  Cultures x 2 sites     Status: None (Preliminary result)   Collection Time: 08/21/23  2:26 PM   Specimen: BLOOD RIGHT ARM  Result Value Ref Range Status   Specimen Description BLOOD RIGHT ARM  Final   Special Requests   Final    BOTTLES DRAWN AEROBIC AND ANAEROBIC Blood Culture adequate volume   Culture   Final    NO GROWTH 2 DAYS Performed at [facility], [address], [place], [place] [address]    Report Status PENDING  Incomplete  Blood Cultures x 2 sites     Status: None (Preliminary result)   Collection Time: 08/21/23  2:31 PM   Specimen: BLOOD RIGHT FOREARM  Result Value Ref Range Status   Specimen Description BLOOD RIGHT FOREARM  Final   Special Requests   Final    BOTTLES DRAWN AEROBIC AND ANAEROBIC Blood Culture adequate volume   Culture   Final    NO GROWTH 2 DAYS Performed at [facility], [address] [place], [place] [address]    Report Status PENDING  Incomplete  Aerobic/Anaerobic Culture w Gram Stain (surgical/deep wound)     Status: None (Preliminary result)   Collection Time: 08/22/23  2:12 PM   Specimen: Soft Tissue, Other  Result Value Ref Range Status   Specimen Description TISSUE  Final   Special Requests right foot  Final   Gram Stain   Final    ABUNDANT WBC PRESENT, PREDOMINANTLY PMN FEW GRAM POSITIVE COCCI IN PAIRS Performed at [facility], [address] [place], [place] [address]    Culture PENDING  Incomplete   Report Status PENDING  Incomplete  Aerobic/Anaerobic Culture w Gram Stain (surgical/deep wound)     Status: None (Preliminary result)   Collection Time: 08/22/23  2:18 PM   Specimen: Soft Tissue, Other  Result Value Ref Range Status   Specimen Description TISSUE  Final   Special Requests bone proximal phalange  Final   Gram Stain   Final    ABUNDANT WBC PRESENT, PREDOMINANTLY PMN RARE GRAM POSITIVE COCCI IN  PAIRS Performed at [facility], [address] [place], [place] [address]    Culture PENDING   Incomplete   Report Status PENDING  Incomplete     Radiology Studies: DG Foot 2 Views Right Result Date: 08/22/2023 CLINICAL DATA:  Postop. 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 Antibiotic beads at the operative bed. Overlying wound VAC in place. Hammertoe deformity of the toes. No evidence of acute fracture. 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 Electronically Signed   By: Narda Rutherford M.D.   On: 08/22/2023 16:03   CT FOOT RIGHT W CONTRAST Result Date: 08/21/2023 CLINICAL DATA:  History of surgery involving the right great toe with persistent pain, swelling and open wound. Abnormal radiographs. EXAM: CT OF THE LOWER RIGHT EXTREMITY WITH CONTRAST TECHNIQUE: Multidetector CT imaging of the lower right extremity was performed according to the standard protocol following intravenous contrast administration. RADIATION DOSE REDUCTION: This exam was performed according to the departmental dose-optimization program which includes automated exposure control, adjustment of the mA and/or kV according to patient size and/or use of iterative reconstruction technique. CONTRAST:  75mL OMNIPAQUE IOHEXOL 350 MG/ML SOLN COMPARISON:  Multiple prior radiographs. The most recent of 08/21/2023. FINDINGS: Definite significant change when when comparing the radiographs from 08/10/2023 and 08/21/2023. The CT scan confirms extensive osteolysis involving the distal phalanx of the great toe along with lucency surrounding the headless screw and marked joint space widening at the interphalangeal joint. Findings consistent with septic arthritis and osteomyelitis. I do not see any definite findings for osteomyelitis involving the first metatarsal head and the hardware in this area is intact. Associated significant changes of cellulitis with a large open wound noted on the dorsum of the foot overlying the great toe, centered  at the fused MTP joint. Diffuse fatty atrophy of the foot musculature without definite findings for pyomyositis. The other bony structures are intact. There are advanced degenerative changes involving the interphalangeal joints of the foot with areas of partial fusion. The tibiotalar and subtalar joints are maintained. No midfoot or hindfoot bony abnormalities. IMPRESSION: 1. CT scan confirms extensive osteomyelitis involving the distal phalanx of the great toe. Significant interval widening at the interphalangeal joint consistent with septic arthritis. 2. Associated significant changes of cellulitis with a large open wound noted on the dorsum of the foot overlying the great toe, centered at the fused MTP joint. 3. Diffuse fatty atrophy of the foot musculature without definite findings for pyomyositis. 4. Advanced degenerative changes involving the interphalangeal joints of the foot with areas of partial fusion. Electronically Signed   By: Rudie Meyer M.D.   On: 08/21/2023 18:39   DG Foot Complete Right Result Date: 08/21/2023 CLINICAL DATA:  Wound. EXAM: RIGHT FOOT COMPLETE - 3+ VIEW COMPARISON:  Right foot radiographs 08/10/2023. FINDINGS: Arthrodesis the first MTP joint is again noted. Progressive lucency is noted about the distal screw across the IP joint. Osteolysis is noted within the distal phalanx in the distal aspect of the proximal phalanx. Extensive edema and gas is present within the great toe. Diffuse soft tissue swelling is present over the dorsum of the foot. IMPRESSION: 1. Progressive lucency about the distal screw across the IP joint of the great toe with osteolysis within the distal phalanx and distal aspect of the proximal phalanx. Findings are concerning for osteomyelitis. 2. Extensive edema and gas within the great toe. 3. Diffuse soft tissue swelling over the dorsum of  the foot. Electronically Signed   By: Marin Roberts M.D.   On: 08/21/2023 13:58    Scheduled Meds:  FLUoxetine   20 mg Oral Daily   heparin  5,000 Units Subcutaneous Q12H   insulin aspart  0-15 Units Subcutaneous TID WC   insulin aspart  0-5 Units Subcutaneous QHS   losartan  25 mg Oral Daily   pantoprazole  40 mg Oral Daily   pravastatin  10 mg Oral q1800   sodium chloride flush  3 mL Intravenous Q12H   triamterene-hydrochlorothiazide  1 tablet Oral Daily   Continuous Infusions:  cefTRIAXone (ROCEPHIN)  IV     lactated ringers     metronidazole 500 mg (08/23/23 0243)   vancomycin 1,250 mg (08/22/23 1730)     LOS: 2 days   Hughie Closs, MD [facility]  08/23/2023, 8:30 AM   *Please note that this is a verbal dictation therefore any spelling or grammatical errors are due to the "[employer]agon [employer] One" system interpretation.  Please page via Amion and do not message via secure chat for urgent patient care matters. Secure chat can be used for non urgent patient care matters.  How to contact the [facility] Attending or Consulting provider 7A - 7P or covering provider during after hours 7P -7A, for this patient?  Check the care team in [facility] and look for a) attending/consulting [facility] provider listed and b) the [facility] team listed. Page or secure chat 7A-7P. Log into [email or web address] and use [facility] universal password to access. If you do not have the password, please contact the hospital operator. Locate the [facility] provider you are looking for under [facility] and page to a number that you can be directly reached. If you still have difficulty reaching the provider, please page the [facility] (Director on Call) for the Hospitalists listed on amion for assistance.

## 2023-08-23 NOTE — Plan of Care (Signed)
 Attempted to see patient, but she is in vascular lab for ABI study.  Plan for repeat OR tomorrow for repeat washout, debridement, abx beads, graft and delayed closure of surgical site.   NPO past MN. Please hold anticoagulation pre op. Will attempt to call patient later to discuss.         Corinna Gab, DPM [facility] / [facility]                   08/23/2023

## 2023-08-23 NOTE — Plan of Care (Signed)

## 2023-08-23 NOTE — Anesthesia Preprocedure Evaluation (Signed)
 Anesthesia Evaluation  Patient identified by MRN, date of birth, ID band Patient awake    Reviewed: Allergy & Precautions, NPO status , Patient's Chart, lab work & pertinent test results  Airway Mallampati: II  TM Distance: >3 FB Neck ROM: Full    Dental  (+) Edentulous Upper, Edentulous Lower   Pulmonary asthma , pneumonia, former smoker   breath sounds clear to auscultation       Cardiovascular hypertension, Pt. on medications  Rhythm:Regular     Neuro/Psych  PSYCHIATRIC DISORDERS Anxiety Depression     Neuromuscular disease    GI/Hepatic Neg liver ROS, PUD,GERD  ,,  Endo/Other  diabetes, Type 2, Insulin Dependent, Oral Hypoglycemic Agents    Renal/GU negative Renal ROS  negative genitourinary   Musculoskeletal  (+) Arthritis , Osteoarthritis,  Osteomyelitis foot   Abdominal   Peds  Hematology Lab Results      Component                Value               Date                      WBC                      8.8                 08/22/2023                HGB                      11.6 (L)            08/22/2023                HCT                      35.7 (L)            08/22/2023                MCV                      85.6                08/22/2023                PLT                      237                 08/22/2023              Anesthesia Other Findings   Reproductive/Obstetrics                             Anesthesia Physical Anesthesia Plan  ASA: 3  Anesthesia Plan: MAC   Post-op Pain Management: Tylenol PO (pre-op)*   Induction: Intravenous  PONV Risk Score and Plan: 2 and Dexamethasone, Ondansetron and Treatment may vary due to age or medical condition  Airway Management Planned: Natural Airway and Simple Face Mask  Additional Equipment: None  Intra-op Plan:   Post-operative Plan: Extubation in OR  Informed Consent: I have reviewed the patients History and Physical, chart,  labs and discussed the procedure including the risks, benefits and alternatives for the proposed  anesthesia with the patient or authorized representative who has indicated his/her understanding and acceptance.     Dental advisory given  Plan Discussed with: CRNA and Anesthesiologist  Anesthesia Plan Comments:        Anesthesia Quick Evaluation

## 2023-08-24 ENCOUNTER — Encounter: Payer: Medicare HMO | Admitting: Podiatry

## 2023-08-24 ENCOUNTER — Encounter: Admitting: Podiatry

## 2023-08-24 ENCOUNTER — Encounter (HOSPITAL_COMMUNITY): Payer: Self-pay | Admitting: Internal Medicine

## 2023-08-24 ENCOUNTER — Inpatient Hospital Stay (HOSPITAL_COMMUNITY): Payer: Self-pay

## 2023-08-24 ENCOUNTER — Other Ambulatory Visit: Payer: Self-pay

## 2023-08-24 ENCOUNTER — Encounter (HOSPITAL_COMMUNITY)

## 2023-08-24 ENCOUNTER — Encounter (HOSPITAL_COMMUNITY)
Admission: EM | Disposition: A | Discharge status: Home Health | Payer: Self-pay | Source: Home / Self Care | Attending: Internal Medicine

## 2023-08-24 DIAGNOSIS — Z7984 Long term (current) use of oral hypoglycemic drugs: Secondary | ICD-10-CM | POA: Diagnosis not present

## 2023-08-24 DIAGNOSIS — I10 Essential (primary) hypertension: Secondary | ICD-10-CM | POA: Diagnosis not present

## 2023-08-24 DIAGNOSIS — M869 Osteomyelitis, unspecified: Secondary | ICD-10-CM

## 2023-08-24 DIAGNOSIS — M86171 Other acute osteomyelitis, right ankle and foot: Secondary | ICD-10-CM | POA: Diagnosis not present

## 2023-08-24 DIAGNOSIS — K279 Peptic ulcer, site unspecified, unspecified as acute or chronic, without hemorrhage or perforation: Secondary | ICD-10-CM | POA: Diagnosis not present

## 2023-08-24 DIAGNOSIS — E1169 Type 2 diabetes mellitus with other specified complication: Secondary | ICD-10-CM

## 2023-08-24 HISTORY — PX: IRRIGATION AND DEBRIDEMENT FOOT: SHX6602

## 2023-08-24 LAB — GLUCOSE, CAPILLARY
Glucose-Capillary: 235 mg/dL — ABNORMAL HIGH (ref 70–99)
Glucose-Capillary: 233 mg/dL — ABNORMAL HIGH (ref 70–99)
Glucose-Capillary: 251 mg/dL — ABNORMAL HIGH (ref 70–99)
Glucose-Capillary: 255 mg/dL — ABNORMAL HIGH (ref 70–99)
Glucose-Capillary: 299 mg/dL — ABNORMAL HIGH (ref 70–99)
Glucose-Capillary: 325 mg/dL — ABNORMAL HIGH (ref 70–99)

## 2023-08-24 LAB — SURGICAL PATHOLOGY

## 2023-08-24 SURGERY — IRRIGATION AND DEBRIDEMENT FOOT
Anesthesia: General | Site: Foot | Laterality: Right

## 2023-08-24 MED ORDER — INSULIN ASPART 100 UNIT/ML IJ SOLN
0.0000 [IU] | INTRAMUSCULAR | Status: DC | PRN
  Administered 2023-08-24: 6 [IU] via SUBCUTANEOUS

## 2023-08-24 MED ORDER — CHLORHEXIDINE GLUCONATE 0.12 % MT SOLN
15.0000 mL | Freq: Once | OROMUCOSAL | Status: AC
  Administered 2023-08-24: 15 mL via OROMUCOSAL
  Filled 2023-08-24: qty 15

## 2023-08-24 MED ORDER — PROPOFOL 1000 MG/100ML IV EMUL
INTRAVENOUS | Status: AC
  Filled 2023-08-24: qty 100

## 2023-08-24 MED ORDER — ORAL CARE MOUTH RINSE: 15.0000 mL | Freq: Once | OROMUCOSAL | Status: AC

## 2023-08-24 MED ORDER — 0.9 % SODIUM CHLORIDE (POUR BTL) OPTIME
TOPICAL | Status: DC | PRN
  Administered 2023-08-24: 1000 mL

## 2023-08-24 MED ORDER — TOBRAMYCIN SULFATE 80 MG/2ML IJ SOLN
INTRAMUSCULAR | Status: AC
  Filled 2023-08-24: qty 4

## 2023-08-24 MED ORDER — TOBRAMYCIN SULFATE 80 MG/2ML IJ SOLN
INTRAMUSCULAR | Status: DC | PRN
  Administered 2023-08-24: 120 mg

## 2023-08-24 MED ORDER — SODIUM CHLORIDE 0.9 % IR SOLN
Status: DC | PRN
  Administered 2023-08-24: 3000 mL

## 2023-08-24 MED ORDER — LACTATED RINGERS IV SOLN: INTRAVENOUS | Status: DC

## 2023-08-24 MED ORDER — INSULIN GLARGINE-YFGN 100 UNIT/ML ~~LOC~~ SOLN
30.0000 [IU] | Freq: Every day | SUBCUTANEOUS | Status: DC
  Administered 2023-08-24 – 2023-08-25 (×2): 30 [IU] via SUBCUTANEOUS
  Filled 2023-08-24 (×2): qty 0.3

## 2023-08-24 MED ORDER — VANCOMYCIN HCL 500 MG IV SOLR
INTRAVENOUS | Status: DC | PRN
  Administered 2023-08-24: 500 mg

## 2023-08-24 MED ORDER — PHENYLEPHRINE 80 MCG/ML (10ML) SYRINGE FOR IV PUSH (FOR BLOOD PRESSURE SUPPORT)
PREFILLED_SYRINGE | INTRAVENOUS | Status: DC | PRN
  Administered 2023-08-24: 80 ug via INTRAVENOUS

## 2023-08-24 MED ORDER — FENTANYL CITRATE (PF) 100 MCG/2ML IJ SOLN: 25.0000 ug | INTRAMUSCULAR | Status: DC | PRN

## 2023-08-24 MED ORDER — INSULIN ASPART 100 UNIT/ML IJ SOLN
6.0000 [IU] | Freq: Once | INTRAMUSCULAR | Status: AC
  Administered 2023-08-24: 6 [IU] via SUBCUTANEOUS

## 2023-08-24 MED ORDER — SODIUM CHLORIDE 0.9 % IV SOLN
3.0000 g | Freq: Four times a day (QID) | INTRAVENOUS | Status: DC
  Administered 2023-08-24 – 2023-08-25 (×4): 3 g via INTRAVENOUS
  Filled 2023-08-24 (×4): qty 8

## 2023-08-24 MED ORDER — MEPERIDINE HCL 25 MG/ML IJ SOLN: 6.2500 mg | INTRAMUSCULAR | Status: DC | PRN

## 2023-08-24 MED ORDER — OXYCODONE HCL 5 MG PO TABS: 5.0000 mg | ORAL_TABLET | Freq: Once | ORAL | Status: DC | PRN

## 2023-08-24 MED ORDER — ACETAMINOPHEN 500 MG PO TABS
1000.0000 mg | ORAL_TABLET | Freq: Once | ORAL | Status: AC
  Administered 2023-08-24: 1000 mg via ORAL
  Filled 2023-08-24: qty 2

## 2023-08-24 MED ORDER — ONDANSETRON HCL 4 MG/2ML IJ SOLN: 4.0000 mg | Freq: Once | INTRAMUSCULAR | Status: DC | PRN

## 2023-08-24 MED ORDER — FENTANYL CITRATE (PF) 250 MCG/5ML IJ SOLN
INTRAMUSCULAR | Status: AC
  Filled 2023-08-24: qty 5

## 2023-08-24 MED ORDER — BUPIVACAINE HCL (PF) 0.5 % IJ SOLN
INTRAMUSCULAR | Status: DC | PRN
  Administered 2023-08-24: 20 mL

## 2023-08-24 MED ORDER — BISACODYL 10 MG RE SUPP
10.0000 mg | Freq: Once | RECTAL | Status: AC
  Administered 2023-08-24: 10 mg via RECTAL
  Filled 2023-08-24: qty 1

## 2023-08-24 MED ORDER — VASOPRESSIN 20 UNIT/ML IV SOLN
INTRAVENOUS | Status: AC
  Filled 2023-08-24: qty 1

## 2023-08-24 MED ORDER — PROPOFOL 10 MG/ML IV BOLUS
INTRAVENOUS | Status: DC | PRN
  Administered 2023-08-24: 20 mg via INTRAVENOUS
  Administered 2023-08-24: 70 mg via INTRAVENOUS
  Administered 2023-08-24: 30 mg via INTRAVENOUS

## 2023-08-24 MED ORDER — EPHEDRINE SULFATE-NACL 50-0.9 MG/10ML-% IV SOSY
PREFILLED_SYRINGE | INTRAVENOUS | Status: DC | PRN
Start: 2023-08-24 — End: 2023-08-24
  Administered 2023-08-24: 2.5 mg via INTRAVENOUS
  Administered 2023-08-24 (×2): 5 mg via INTRAVENOUS
  Administered 2023-08-24: 2.5 mg via INTRAVENOUS

## 2023-08-24 MED ORDER — PROPOFOL 10 MG/ML IV BOLUS
INTRAVENOUS | Status: AC
  Filled 2023-08-24: qty 20

## 2023-08-24 MED ORDER — PROPOFOL 500 MG/50ML IV EMUL
INTRAVENOUS | Status: DC | PRN
  Administered 2023-08-24: 100 ug/kg/min via INTRAVENOUS

## 2023-08-24 MED ORDER — BUPIVACAINE HCL (PF) 0.5 % IJ SOLN
INTRAMUSCULAR | Status: AC
  Filled 2023-08-24: qty 30

## 2023-08-24 MED ORDER — OXYCODONE HCL 5 MG/5ML PO SOLN: 5.0000 mg | Freq: Once | ORAL | Status: DC | PRN

## 2023-08-24 MED ORDER — VANCOMYCIN HCL 500 MG IV SOLR
INTRAVENOUS | Status: AC
  Filled 2023-08-24: qty 10

## 2023-08-24 MED ORDER — ONDANSETRON HCL 4 MG/2ML IJ SOLN
INTRAMUSCULAR | Status: DC | PRN
  Administered 2023-08-24: 4 mg via INTRAVENOUS

## 2023-08-24 SURGICAL SUPPLY — 50 items
BEADS BIO ARTH CALC SULFAT 5CC (Bone Implant) IMPLANT
BIOBEADS ARTH CALC SULFATE 5CC (Bone Implant) ×1 IMPLANT
BLADE AVERAGE 25X9 (BLADE) IMPLANT
BLADE SURG 15 STRL LF DISP TIS (BLADE) ×2 IMPLANT
BNDG ELASTIC 3INX 5YD STR LF (GAUZE/BANDAGES/DRESSINGS) ×2 IMPLANT
BNDG ELASTIC 4INX 5YD STR LF (GAUZE/BANDAGES/DRESSINGS) ×2 IMPLANT
BNDG ESMARK 4X9 LF (GAUZE/BANDAGES/DRESSINGS) ×2 IMPLANT
BNDG GAUZE DERMACEA FLUFF 4 (GAUZE/BANDAGES/DRESSINGS) ×2 IMPLANT
CHLORAPREP W/TINT 26 (MISCELLANEOUS) ×2 IMPLANT
CNTNR URN SCR LID CUP LEK RST (MISCELLANEOUS) ×2 IMPLANT
COVER BACK TABLE 60X90IN (DRAPES) ×2 IMPLANT
CUFF TOURN SGL QUICK 18X4 (TOURNIQUET CUFF) ×2 IMPLANT
CUFF TRNQT CYL 24X4X16.5-23 (TOURNIQUET CUFF) ×2 IMPLANT
DRAPE 3/4 80X56 (DRAPES) ×2 IMPLANT
DRAPE EXTREMITY T 121X128X90 (DISPOSABLE) ×2 IMPLANT
DRAPE SHEET LG 3/4 BI-LAMINATE (DRAPES) ×2 IMPLANT
DRAPE U-SHAPE 47X51 STRL (DRAPES) ×2 IMPLANT
ELECT REM PT RETURN 15FT ADLT (MISCELLANEOUS) ×2 IMPLANT
GAUZE PAD ABD 8X10 STRL (GAUZE/BANDAGES/DRESSINGS) IMPLANT
GAUZE SPONGE 4X4 12PLY STRL (GAUZE/BANDAGES/DRESSINGS) ×2 IMPLANT
GAUZE XEROFORM 1X8 LF (GAUZE/BANDAGES/DRESSINGS) ×2 IMPLANT
GLOVE BIO SURGEON STRL SZ7.5 (GLOVE) ×2 IMPLANT
GLOVE BIOGEL PI IND STRL 7.5 (GLOVE) ×2 IMPLANT
GOWN STRL REUS W/ TWL XL LVL3 (GOWN DISPOSABLE) ×2 IMPLANT
GRAFT SKIN WND MICRO 38 (Tissue) IMPLANT
KIT BASIN OR (CUSTOM PROCEDURE TRAY) ×2 IMPLANT
KIT TURNOVER KIT B (KITS) IMPLANT
MANIFOLD NEPTUNE II (INSTRUMENTS) ×2 IMPLANT
NDL HYPO 25X1 1.5 SAFETY (NEEDLE) ×2 IMPLANT
NEEDLE HYPO 25X1 1.5 SAFETY (NEEDLE) ×1 IMPLANT
NS IRRIG 1000ML POUR BTL (IV SOLUTION) ×2 IMPLANT
PADDING CAST ABS COTTON 4X4 ST (CAST SUPPLIES) ×2 IMPLANT
PENCIL SMOKE EVACUATOR (MISCELLANEOUS) IMPLANT
SET IRRIG Y TYPE TUR BLADDER L (SET/KITS/TRAYS/PACK) ×2 IMPLANT
SPONGE T-LAP 4X18 ~~LOC~~+RFID (SPONGE) ×2 IMPLANT
STAPLER SKIN PROX WIDE 3.9 (STAPLE) ×2 IMPLANT
STOCKINETTE 6 STRL (DRAPES) ×2 IMPLANT
SUCTION TUBE FRAZIER 10FR DISP (SUCTIONS) ×2 IMPLANT
SUT ETHILON 3 0 PS 1 (SUTURE) ×2 IMPLANT
SUT ETHILON 4 0 PS 2 18 (SUTURE) ×2 IMPLANT
SUT MNCRL AB 3-0 PS2 18 (SUTURE) ×2 IMPLANT
SUT MNCRL AB 4-0 PS2 18 (SUTURE) ×2 IMPLANT
SUT PROLENE 3 0 PS 2 (SUTURE) IMPLANT
SUT VIC AB 2-0 SH 27XBRD (SUTURE) ×2 IMPLANT
SYR BULB EAR ULCER 3OZ GRN STR (SYRINGE) ×2 IMPLANT
SYR CONTROL 10ML LL (SYRINGE) ×2 IMPLANT
TUBE CONNECTING 12X1/4 (SUCTIONS) ×2 IMPLANT
TUBE IRRIGATION SET MISONIX (TUBING) ×2 IMPLANT
UNDERPAD 30X36 HEAVY ABSORB (UNDERPADS AND DIAPERS) ×2 IMPLANT
YANKAUER SUCT BULB TIP NO VENT (SUCTIONS) ×2 IMPLANT

## 2023-08-24 NOTE — Progress Notes (Signed)
 PROGRESS NOTE    Amy Garner  ZOX:[id number] DOB: 08-18-1955 DOA: 08/21/2023 PCP: Georganna Skeans, MD   Brief Narrative:  Amy Garner is a 68 y.o. female with past medical history diabetes mellitus type 2, essential hypertension, peptic ulcer disease, GERD, depression, hyperlipidemia, anxiety, asthma, osteoarthritis of the first MTP presented with pain and erythema swelling initially on the dorsum of her foot that eventually ruptured leaving an ulcer and drainage where she had surgery.  No reports of chest pain shortness of breath headaches dizziness falls.  Podiatry on board.  Assessment & Plan:   Active Problems:   Hypertension   Peptic ulcer   Type 2 diabetes mellitus (HCC)   GERD (gastroesophageal reflux disease)   Osteomyelitis (HCC)   Osteomyelitis of toe of right foot (HCC)  Diabetic wound and osteomyelitis involving distal phalanx of the right great toe/cellulitis, POA: Started on broad-spectrum antibiotics including Rocephin, Flagyl and vancomycin, seen by podiatry, underwent right foot reimplantation with application of the wound VAC on 08/22/2023.  ABI completed 08/23/2023 shows normal ABI on the right lower extremity but mild arterial disease on the left lower extremity.  Patient is scheduled to go to the OR for repeat washout, debridement and antibiotic beads as well as graft and delayed closure of surgical site today with podiatry.  Type 2 diabetes mellitus: PTA meds includes Lantus 62 units daily and metformin.  Currently she on Lantus 20 units but still hyperglycemic.  Since she is n.p.o. and pending surgery, will increase gradually to 30 units today.  Hyperlipidemia: Continue statin.  Essential hypertension: PTA losartan and Dyazide which is on hold and blood pressure controlled.  GERD: Continue PPI.  Anxiety/depression: Resume Prozac.  DVT prophylaxis:    Code Status: Full Code  Family Communication:  None present at bedside.  Plan of care discussed with  patient in length and he/she verbalized understanding and agreed with it.  Status is: Inpatient Remains inpatient appropriate because: Osteomyelitis, needs another washout tomorrow morning.   Estimated body mass index is 39.93 kg/m as calculated from the following:   Height as of this encounter: 5\' 6"  (1.676 m).   Weight as of this encounter: 112.2 kg.    Nutritional Assessment: Body mass index is 39.93 kg/m.Marland Kitchen Seen by dietician.  I agree with the assessment and plan as outlined below: Nutrition Status:        . Skin Assessment: I have examined the patient's skin and I agree with the wound assessment as performed by the wound care RN as outlined below:    Consultants:  Podiatry  Procedures:  As above  Antimicrobials:  Anti-infectives (From admission, onward)    Start     Dose/Rate Route Frequency Ordered Stop   08/22/23 1800  vancomycin (VANCOREADY) IVPB 1250 mg/250 mL        1,250 mg 166.7 mL/hr over 90 Minutes Intravenous Every 24 hours 08/21/23 1625     08/22/23 1400  cefTRIAXone (ROCEPHIN) 2 g in sodium chloride 0.9 % 100 mL IVPB        2 g 200 mL/hr over 30 Minutes Intravenous Every 24 hours 08/21/23 1623     08/22/23 1346  tobramycin (NEBCIN) injection  Status:  Discontinued          As needed 08/22/23 1348 08/22/23 1438   08/22/23 1345  vancomycin (VANCOCIN) powder  Status:  Discontinued          As needed 08/22/23 1346 08/22/23 1438   08/22/23 0200  metroNIDAZOLE (FLAGYL) IVPB 500  mg        500 mg 100 mL/hr over 60 Minutes Intravenous Every 12 hours 08/21/23 1623     08/21/23 1430  cefTRIAXone (ROCEPHIN) 2 g in sodium chloride 0.9 % 100 mL IVPB  Status:  Discontinued       Placed in "And" Linked Group   2 g 200 mL/hr over 30 Minutes Intravenous Once 08/21/23 1426 08/21/23 1429   08/21/23 1430  metroNIDAZOLE (FLAGYL) IVPB 500 mg  Status:  Discontinued       Placed in "And" Linked Group   500 mg 100 mL/hr over 60 Minutes Intravenous  Once 08/21/23 1426  08/21/23 1429   08/21/23 1430  vancomycin (VANCOCIN) IVPB 1000 mg/200 mL premix  Status:  Discontinued       Placed in "And" Linked Group   1,000 mg 200 mL/hr over 60 Minutes Intravenous  Once 08/21/23 1426 08/21/23 1429   08/21/23 1430  vancomycin (VANCOREADY) IVPB 1500 mg/300 mL       Placed in "And" Linked Group   1,500 mg 150 mL/hr over 120 Minutes Intravenous  Once 08/21/23 1429 08/21/23 1718   08/21/23 1430  cefTRIAXone (ROCEPHIN) 2 g in sodium chloride 0.9 % 100 mL IVPB       Placed in "And" Linked Group   2 g 200 mL/hr over 30 Minutes Intravenous Once 08/21/23 1429 08/21/23 1616   08/21/23 1430  metroNIDAZOLE (FLAGYL) IVPB 500 mg       Placed in "And" Linked Group   500 mg 100 mL/hr over 60 Minutes Intravenous  Once 08/21/23 1429 08/21/23 1622         Subjective: Patient seen and examined.  No complaints other than right foot pain.  Objective: Vitals:   08/23/23 1441 08/23/23 1951 08/24/23 0415 08/24/23 0500  BP: (!) 121/55 107/65 (!) 107/58   Pulse: 68 72 (!) 59   Resp: 18 18 17    Temp: 99.1 F (37.3 C) 97.9 F (36.6 C) 98.1 F (36.7 C)   TempSrc: Oral Oral Oral   SpO2: 99% 97% 97%   Weight:    112.2 kg  Height:        Intake/Output Summary (Last 24 hours) at 08/24/2023 0728 Last data filed at 08/23/2023 1831 Gross per 24 hour  Intake 236 ml  Output 100 ml  Net 136 ml   Filed Weights   08/21/23 1709 08/22/23 1245 08/24/23 0500  Weight: 107.3 kg 110.2 kg 112.2 kg    Examination:  General exam: Appears calm and comfortable  Respiratory system: Clear to auscultation. Respiratory effort normal. Cardiovascular system: S1 & S2 heard, RRR. No JVD, murmurs, rubs, gallops or clicks. No pedal edema. Gastrointestinal system: Abdomen is nondistended, soft and nontender. No organomegaly or masses felt. Normal bowel sounds heard. Central nervous system: Alert and oriented. No focal neurological deficits. Extremities: Symmetric 5 x 5 power. Skin: Has dressing and  wound VAC attached to the right foot. Psychiatry: Judgement and insight appear normal. Mood & affect appropriate.    Data Reviewed: I have personally reviewed following labs and imaging studies  CBC: Recent Labs  Lab 08/21/23 1247 08/22/23 0625  WBC 10.7* 8.8  NEUTROABS 6.5  --   HGB 13.6 11.6*  HCT 41.3 35.7*  MCV 86.0 85.6  PLT 303 237   Basic Metabolic Panel: Recent Labs  Lab 08/21/23 1247 08/22/23 0625  NA 133* 137  K 4.0 3.7  CL 96* 102  CO2 24 23  GLUCOSE 404* 141*  BUN 18 19  CREATININE 0.89 0.87  CALCIUM 9.8 9.0   GFR: Estimated Creatinine Clearance: 78.6 mL/min (by C-G formula based on SCr of 0.87 mg/dL). Liver Function Tests: Recent Labs  Lab 08/21/23 1837 08/22/23 0625  AST 31 25  ALT 31 28  ALKPHOS 87 75  BILITOT 0.5 0.4  PROT 6.8 5.9*  ALBUMIN 3.0* 2.6*   No results for input(s): "LIPASE", "AMYLASE" in the last 168 hours. No results for input(s): "AMMONIA" in the last 168 hours. Coagulation Profile: No results for input(s): "INR", "PROTIME" in the last 168 hours. Cardiac Enzymes: No results for input(s): "CKTOTAL", "CKMB", "CKMBINDEX", "TROPONINI" in the last 168 hours. BNP (last 3 results) No results for input(s): "PROBNP" in the last 8760 hours. HbA1C: No results for input(s): "HGBA1C" in the last 72 hours. CBG: Recent Labs  Lab 08/23/23 0859 08/23/23 1242 08/23/23 1828 08/23/23 1952 08/24/23 0647  GLUCAP 232* 202* 287* 362* 235*   Lipid Profile: No results for input(s): "CHOL", "HDL", "LDLCALC", "TRIG", "CHOLHDL", "LDLDIRECT" in the last 72 hours. Thyroid Function Tests: No results for input(s): "TSH", "T4TOTAL", "FREET4", "T3FREE", "THYROIDAB" in the last 72 hours. Anemia Panel: No results for input(s): "VITAMINB12", "FOLATE", "FERRITIN", "TIBC", "IRON", "RETICCTPCT" in the last 72 hours. Sepsis Labs: Recent Labs  Lab 08/21/23 1253 08/21/23 1516  LATICACIDVEN 2.2* 2.5*    Recent Results (from the past 240 hours)  Blood  Cultures x 2 sites     Status: None (Preliminary result)   Collection Time: 08/21/23  2:26 PM   Specimen: BLOOD RIGHT ARM  Result Value Ref Range Status   Specimen Description BLOOD RIGHT ARM  Final   Special Requests   Final    BOTTLES DRAWN AEROBIC AND ANAEROBIC Blood Culture adequate volume   Culture   Final    NO GROWTH 2 DAYS Performed at [facility], [address] [place], [place] [address]    Report Status PENDING  Incomplete  Blood Cultures x 2 sites     Status: None (Preliminary result)   Collection Time: 08/21/23  2:31 PM   Specimen: BLOOD RIGHT FOREARM  Result Value Ref Range Status   Specimen Description BLOOD RIGHT FOREARM  Final   Special Requests   Final    BOTTLES DRAWN AEROBIC AND ANAEROBIC Blood Culture adequate volume   Culture   Final    NO GROWTH 2 DAYS Performed at [facility], [address], [place], [place] [address]    Report Status PENDING  Incomplete  Aerobic/Anaerobic Culture w Gram Stain (surgical/deep wound)     Status: None (Preliminary result)   Collection Time: 08/22/23  2:12 PM   Specimen: Soft Tissue, Other  Result Value Ref Range Status   Specimen Description TISSUE  Final   Special Requests right foot  Final   Gram Stain   Final    ABUNDANT WBC PRESENT, PREDOMINANTLY PMN FEW GRAM POSITIVE COCCI IN PAIRS    Culture   Final    ABUNDANT GROUP B STREP(S.AGALACTIAE)ISOLATED TESTING AGAINST S. AGALACTIAE NOT ROUTINELY PERFORMED DUE TO PREDICTABILITY OF AMP/PEN/VAN SUSCEPTIBILITY. Performed at [facility], [address] [place], [place] [address]    Report Status PENDING  Incomplete  Aerobic/Anaerobic Culture w Gram Stain (surgical/deep wound)     Status: None (Preliminary result)   Collection Time: 08/22/23  2:18 PM   Specimen: Soft Tissue, Other  Result Value Ref Range Status   Specimen Description TISSUE  Final   Special Requests bone proximal phalange  Final  Gram Stain   Final    ABUNDANT WBC PRESENT,  PREDOMINANTLY PMN RARE GRAM POSITIVE COCCI IN PAIRS    Culture   Final    MODERATE GROUP B STREP(S.AGALACTIAE)ISOLATED TESTING AGAINST S. AGALACTIAE NOT ROUTINELY PERFORMED DUE TO PREDICTABILITY OF AMP/PEN/VAN SUSCEPTIBILITY. Performed at [facility], [address] [place], [place] [address]    Report Status PENDING  Incomplete     Radiology Studies: VAS [place] ABI WITH/WO TBI Result Date: 08/23/2023  LOWER EXTREMITY DOPPLER STUDY Patient Name:  EMANUEL CAMPOS  Date of Exam:   08/23/2023 Medical Rec #: [id number]           Accession #:    [id number] Date of Birth: 1955/09/24           Patient Gender: F Patient Age:   59 years Exam Location:  [facility] Procedure:      VAS [place] ABI WITH/WO TBI Referring Phys: Ovid Curd --------------------------------------------------------------------------------  Indications: Ulceration. Post-op- right great toe amputation. High Risk Factors: Hypertension, hyperlipidemia, Diabetes.  Limitations: Today's exam was limited due to bandages. Performing Technologist: Fernande Bras  Examination Guidelines: A complete evaluation includes at minimum, Doppler waveform signals and systolic blood pressure reading at the level of bilateral brachial, anterior tibial, and posterior tibial arteries, when vessel segments are accessible. Bilateral testing is considered an integral part of a complete examination. Photoelectric Plethysmograph (PPG) waveforms and toe systolic pressure readings are included as required and additional duplex testing as needed. Limited examinations for reoccurring indications may be performed as noted.  ABI Findings: +---------+------------------+-----+---------+----------+ Right    Rt Pressure (mmHg)IndexWaveform Comment    +---------+------------------+-----+---------+----------+ Brachial 142                    triphasic           +---------+------------------+-----+---------+----------+ PTA      137               0.90  triphasic           +---------+------------------+-----+---------+----------+ DP       148               0.97 triphasic           +---------+------------------+-----+---------+----------+ Great Toe78                0.51 Abnormal 2nd digit. +---------+------------------+-----+---------+----------+ +---------+------------------+-----+---------+-------+ Left     Lt Pressure (mmHg)IndexWaveform Comment +---------+------------------+-----+---------+-------+ Brachial 152                    triphasic        +---------+------------------+-----+---------+-------+ PTA      126               0.83 triphasic        +---------+------------------+-----+---------+-------+ DP       130               0.86 triphasic        +---------+------------------+-----+---------+-------+ Great Toe97                0.64 Abnormal         +---------+------------------+-----+---------+-------+ +-------+-----------+-----------+------------+------------+ ABI/TBIToday's ABIToday's TBIPrevious ABIPrevious TBI +-------+-----------+-----------+------------+------------+ Right  0.97       0.51                                +-------+-----------+-----------+------------+------------+ Left   0.86       0.64                                +-------+-----------+-----------+------------+------------+  Summary: Right: Resting right ankle-brachial index is within normal range. The right toe-brachial index is abnormal. Left: Resting left ankle-brachial index indicates mild left lower extremity arterial disease. The left toe-brachial index is abnormal. *See table(s) above for measurements and observations.  Electronically signed by Lemar Livings MD on 08/23/2023 at 7:48:50 PM.    Final    DG Foot 2 Views Right Result Date: 08/22/2023 CLINICAL DATA:  Postop. 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 Antibiotic beads at the  operative bed. Overlying wound VAC in place. Hammertoe deformity of the toes. No evidence of acute fracture. 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 Electronically Signed   By: Narda Rutherford M.D.   On: 08/22/2023 16:03    Scheduled Meds:  acetaminophen  1,000 mg Oral Once   docusate sodium  100 mg Oral BID   FLUoxetine  20 mg Oral Daily   insulin aspart  0-15 Units Subcutaneous TID WC   insulin aspart  0-5 Units Subcutaneous QHS   insulin glargine-yfgn  20 Units Subcutaneous Daily   losartan  25 mg Oral Daily   pantoprazole  40 mg Oral Daily   pravastatin  10 mg Oral q1800   sodium chloride flush  3 mL Intravenous Q12H   triamterene-hydrochlorothiazide  1 tablet Oral Daily   Continuous Infusions:  cefTRIAXone (ROCEPHIN)  IV 2 g (08/23/23 1507)   lactated ringers     metronidazole 500 mg (08/24/23 0149)   vancomycin 1,250 mg (08/23/23 1831)     LOS: 3 days   Hughie Closs, MD [facility]  08/24/2023, 7:28 AM   *Please note that this is a verbal dictation therefore any spelling or grammatical errors are due to the "Dragon Medical One" system interpretation.  Please page via Amion and do not message via secure chat for urgent patient care matters. Secure chat can be used for non urgent patient care matters.  How to contact the [facility] Attending or Consulting provider 7A - 7P or covering provider during after hours 7P -7A, for this patient?  Check the care team in [facility] and look for a) attending/consulting TRH provider listed and b) the [facility] team listed. Page or secure chat 7A-7P. Log into [email or web address] and use [facility]'s universal password to access. If you do not have the password, please contact the hospital operator. Locate the [facility] provider you are looking for under [facility] and page to a number that you can be directly reached. If you still have difficulty reaching the provider, please page the [facility] (Director on Call) for the Hospitalists  listed on amion for assistance.

## 2023-08-24 NOTE — Transfer of Care (Signed)
 Immediate Anesthesia Transfer of Care Note  Patient: Amy Garner  Procedure(s) Performed: IRRIGATION AND DEBRIDEMENT FOOT (Right: Foot)  Patient Location: PACU  Anesthesia Type:MAC  Level of Consciousness: awake and alert   Airway & Oxygen Therapy: Patient Spontanous Breathing  Post-op Assessment: Report given to RN and Post -op Vital signs reviewed and stable  Post vital signs: Reviewed and stable  Last Vitals:  Vitals Value Taken Time  BP 109/95 08/24/23 0909  Temp    Pulse 70 08/24/23 0913  Resp 13 08/24/23 0913  SpO2 93 % 08/24/23 0913  Vitals shown include unfiled device data.  Last Pain:  Vitals:   08/24/23 0800  TempSrc: Oral  PainSc: 0-No pain      Patients Stated Pain Goal: 3 (08/24/23 0800)  Complications: No notable events documented.

## 2023-08-24 NOTE — Anesthesia Postprocedure Evaluation (Signed)
 Anesthesia Post Note  Patient: Amy Garner  Procedure(s) Performed: AMPUTATION, FOOT, RAY (Right) APPLICATION, WOUND VAC (Right: Foot)     Patient location during evaluation: PACU Anesthesia Type: MAC Level of consciousness: awake and alert Pain management: pain level controlled Vital Signs Assessment: post-procedure vital signs reviewed and stable Respiratory status: spontaneous breathing, nonlabored ventilation and respiratory function stable Cardiovascular status: stable and blood pressure returned to baseline Postop Assessment: no apparent nausea or vomiting Anesthetic complications: no   No notable events documented.                Dani Danis

## 2023-08-24 NOTE — Inpatient Diabetes Management (Addendum)
 Inpatient Diabetes Program Recommendations  AACE/ADA: New Consensus Statement on Inpatient Glycemic Control (2015)  Target Ranges:  Prepandial:   less than 140 mg/dL      Peak postprandial:   less than 180 mg/dL (1-2 hours)      Critically ill patients:  140 - 180 mg/dL    Latest Reference Range & Units 08/21/23 12:47  Glucose 70 - 99 mg/dL 161 (H)  (H): Data is abnormally high  Latest Reference Range & Units 01/21/23 11:30 07/21/23 11:20  Hemoglobin A1C 4.0 - 5.6 % 7.0 ! 12.5 !    Latest Reference Range & Units 08/22/23 23:42 08/23/23 04:34 08/23/23 08:59 08/23/23 12:42 08/23/23 18:28 08/23/23 19:52  Glucose-Capillary 70 - 99 mg/dL 096 (H) 045 (H) 409 (H)  5 units Novolog  202 (H)  5 units Novolog  20 units Semglee  287 (H)  8 units Novolog  362 (H)  5 units Novolog   (H): Data is abnormally high  Latest Reference Range & Units 08/24/23 06:47 08/24/23 07:34  Glucose-Capillary 70 - 99 mg/dL 811 (H) 914 (H)  6 units Novolog  (H): Data is abnormally high    Admit with: Diabetic wound and osteomyelitis involving distal phalanx of the right great toe/cellulitis   History: DM  Home DM Meds: Toujeo 62 units daily        Metformin 1000 mg BID        Ozempic (NOT taking)  Current Orders: Semglee 30 units daily     Novolog Moderate Correction Scale/ SSI (0-15 units) TID AC + HS    PCP: Dr. Andrey Campanile [facility] Last Seen 07/21/2023  Surgery today for washout and antibiotic beads placement    MD--Note Semglee increased to 30 units daily today  Once pt resumes PO diet today after Surgery, please consider starting Novolog Meal Coverage: Novolog 4 units TID with meals HOLD if pt NPO HOLD if pt eats <50% meals   Addendum 11am--Met w/ pt at bedside to review most recent A1c of 12.5% in March at PCP office.  Pt told me she was shocked to hear it was so high when she last saw her PCP in March.  Has CBG meter at home and usually checks BID but has  not seen a lot of CBGs >300 at home.  Verified with pt that she is indeed taking her meds at home--Taking Toujeo 62 units daily (sometimes splits the dose into 2 doses) and also taking the Metformin 1000 mg BID.  Stopped taking Ozempic about 1 mos ago due to stomach upset.  I discussed with pt that she needs much better control at home to help with healing her foot.  Reviewed hospital insulin regimen with pt and explained why we are giving Novolog and what Novolog is.  Pt told me she has seen "Dr. Franky Macho" who is a PharmD with [facility] and helps her with her blood sugar management.  I asked pt to please contact her PCP office to get help setting up follow up appt with both PCP and the PharmD to better manage CBGs when she goes home.  Discussed with pt that the discharging MD may adjust her insulin when she goes home, however, they may want her to follow up with her PCP instead.  Pt very appreciative of visit and did not have any further questions for me at this time.      --Will follow patient during hospitalization--  Ambrose Finland RN,  MSN, CDCES Diabetes Coordinator Inpatient Glycemic Control Team Team Pager: [phone] (8a-5p)

## 2023-08-24 NOTE — Progress Notes (Signed)
 Transition of Care [facility]) - Inpatient Brief Assessment   Patient Details  Name: Amy Garner MRN: [id number] Date of Birth: 13-Jan-1956  [facility] CM/SW Contact:    Janae Bridgeman, RN Phone Number: 08/24/2023, 4:46 PM   Clinical Narrative: CM met with the patient at the bedside to discuss TOC needs.  The patient is S/P right big toe amputation.   The patient plans to return to daughter's home for care after discharge - [address], [place], [place].  Patient was provided Medicare choice regarding home health services and patient did not have a preference.  I called Kandee Keen, RN with [facility] and he accepted for PT/OT.  HH orders placed.  I will hold off on ordering DME since PT plans to mobilize patient tomorrow and determine Rolator versus RW.  Patient will need 3:1.  Patient did not have preference for DME company.  CM will continue to follow the patient for DME needs before discharge to home.   Transition of Care Asessment: Insurance and Status: (P) Insurance coverage has been reviewed Patient has primary care physician: (P) Yes Home environment has been reviewed: (P) from home alone Prior level of function:: (P) Independent Prior/Current Home Services: (P) No current home services Social Drivers of Health Review: (P) SDOH reviewed needs interventions Readmission risk has been reviewed: (P) Yes Transition of care needs: (P) transition of care needs identified, TOC will continue to follow

## 2023-08-24 NOTE — Anesthesia Postprocedure Evaluation (Signed)
 Anesthesia Post Note  Patient: Amy Garner  Procedure(s) Performed: IRRIGATION AND DEBRIDEMENT FOOT (Right: Foot)     Anesthesia Type: General Anesthetic complications: no   No notable events documented.  Last Vitals:  Vitals:   08/24/23 0915 08/24/23 0930  BP: (!) 106/44 (!) 99/53  Pulse: 68 63  Resp: 16 17  Temp:    SpO2: 94% 94%    Last Pain:  Vitals:   08/24/23 0910  TempSrc:   PainSc: 0-No pain                 Jessicamarie Amiri

## 2023-08-24 NOTE — Progress Notes (Signed)
 History and Physical Interval Note:  08/24/2023 8:20 AM  Amy Garner  has presented today for surgery, with the diagnosis of open surgical wound s/p partial 1st ray amputation right foot.  The various methods of treatment have been discussed with the patient and family. After consideration of risks, benefits and other options for treatment, the patient has consented to   Procedure(s) with comments: IRRIGATION AND DEBRIDEMENT FOOT (Right) - Irrigation and Debridement Right Foot Wound with application of skin graft, Antibiotic Beads, and Delayed Closure of Wound as a surgical intervention.  The patient's history has been reviewed, patient examined, no change in status, stable for surgery.  I have reviewed the patient's chart and labs.  Questions were answered to the patient's satisfaction.     Jenelle Mages Kalis Friese

## 2023-08-24 NOTE — Op Note (Signed)
 Full Operative Report  Date of Operation: 9:05 AM, 08/24/2023   Patient: Amy Garner - 68 y.o. female  Surgeon: Pilar Plate, DPM   Assistant: None  Diagnosis: Osteomyelitis right foot  Procedure:  1.  Repeat irrigation and excisional debridement with prep for graft, right foot 2.  Application dermal allograft 76 cm2, right foot 3.  Application dissolvable antibiotic beads, right foot 4.  Delayed primary closure of surgical site 7 x 3 cm, right foot     Anesthesia: Monitor Anesthesia Care  Bethena Midget, MD  Anesthesiologist: Bethena Midget, MD CRNA: Sandie Ano, CRNA   Estimated Blood Loss: 10 mL  Hemostasis: 1) Anatomical dissection, mechanical compression, electrocautery 2) no tourniquet was used during procedure  Implants: Implant Name Type Inv. Item Serial No. Manufacturer Lot No. LRB No. Used Action  GRAFT SKIN WND MICRO 38 - [id number] Tissue GRAFT SKIN WND MICRO 38  [employer] [id number] Right 2 Implanted  BIOBEADS ARTH CALC SULFATE 5CC - [id number] Bone Implant BIOBEADS ARTH CALC SULFATE 5CC  [employer] [id number] Right 1 Implanted    Materials: Prolene 3-0 and skin staples  Injectables: 1) Pre-operatively: 20 cc of   0.5% marcaine plain 2) Post-operatively: None   Specimens: Pathology: Proximal margin first metatarsal with proximal margin inked microbiology: None   Antibiotics: IV antibiotics given per schedule on the floor  Drains: None  Complications: Patient tolerated the procedure well without complication.   Operative findings: As below in detailed report  Indications for Procedure: Amy Garner presents to Pilar Plate, DPM with a chief complaint of open surgical site status post prior partial first ray amputation for osteomyelitis this admission.  Presenting for planned return to the OR for staged  procedure. The patient has failed conservative treatments of various modalities. At this time the patient has  elected to proceed with surgical correction. All alternatives, risks, and complications of the procedures were thoroughly explained to the patient. Patient exhibits appropriate understanding of all discussion points and informed consent was signed and obtained in the chart with no guarantees to surgical outcome given or implied.  Description of Procedure: Patient was brought to the operating room. Patient remained on their hospital bed in the supine position. A surgical timeout was performed and all members of the operating room, the procedure, and the surgical site were identified. anesthesia occurred as per anesthesia record. Local anesthetic as previously described was then injected about the operative field in a local infiltrative block.  The operative lower extremity as noted above was then prepped and draped in the usual sterile manner. The following procedure then began.  Attention was directed to the surgical site present on the first ray amputation of the right foot.  Rongeur was used to excisionally debride the wound.  Previously placed antibiotic beads were removed from the amputation site.  The wound was excisionally debrided to the level of the first metatarsal bone with rongeur with removal of any necrotic or fibrotic tissues.  There was healthy granular tissue in the wound bed wound VAC use.  Tissues bled appropriately.  The wound was completely debrided excisionally of any necrotic or fibrotic tissue was no further purulence or infected tissue was seen at the completion of the debridement.  Next a sagittal saw was used to make a transverse osteotomy at the first metatarsal stump site at site of previous resection.  A cylindrical piece of bone was resected and passed the back table at the proximal margin was inked and was sent  for pathology.  Next the surgical site was irrigated thoroughly with 3 L of sterile saline via power pulse lavage.  The wound measured approximately 7 x 3 cm  postdebridement.  Next dissolvable calcium sulfate antibiotic beads were prepared on the back table.  The beads were mixed with vancomycin and tobramycin.  5 cc of small beads were prepared.  The beads were then mixed with 2 packets of Kerecis micronized dermal allograft total of 76 cm.  Sterile saline was added to the mixture to make a slurry.  The combination antibiotic bead and Kerecis micronized dermal allograft was then implanted deep within the surgical cavity.  This was done for wound healing as well as to fill dead space.  Next the surgical site was deemed appropriate for delayed primary closure.  The wound measured 7 x 3 cm.  Using combination of 3-0 Prolene and skin staples the surgical site was then closed in simple interrupted fashion with good apposition  of the tissue margins under no tension.  The surgical site was then dressed with Xeroform 4 x 4 ABD pad Kerlix Ace. The patient tolerated both the procedure and anesthesia well with vital signs stable throughout. The patient was transferred in good condition and all vital signs stable  from the OR to recovery under the discretion of anesthesia.  Condition: Vital signs stable, neurovascular status unchanged from preoperative   Surgical plan:  Bone and first metatarsal resection site was healthy and firm when cutting.  No evidence of residual infection.  Surgical site was closed with graft and antibiotic beads implanted.  Recommend nonweightbearing for initial stage of healing.  Follow cultures and pathology.  Antibiotic plan pending  proximal margin eval.  The patient will be nonweightbearing in a postop shoe to the operative limb until further instructed. The dressing is to remain clean, dry, and intact. Will continue to follow unless noted elsewhere.   Carlena Hurl, DPM [facility]

## 2023-08-24 NOTE — Evaluation (Signed)
 Physical Therapy Evaluation Patient Details Name: Amy Garner MRN: [id number] DOB: 18-Aug-1955 Today's Date: 08/24/2023  History of Present Illness  68 y.o. female presents to [facility] 08/21/23 with ulcer and drainage on right first MPT. 4/7 partial first ray amputation on R foot. 4/9 repeat I&D on R foot. PMHx: DMT2, HTN, GERD, depression, hyperlipidemia, anxiety, asthma, osteoarthritis of the first MTP   Clinical Impression  Pt in bed upon arrival and agreeable to PT eval. PTA, pt was independent for mobility with no AD using heel WB on R LE. In months prior, pt would use a R knee scooter for mobility. In today's session, pt required MinA to CGA to stand with use of RW. Pt needed cues to keep R LE in extension and assist to boost-up. Pt did well with adhering to WB precautions when ambulating and was able to move to/from the bathroom with CGA and RW. Pt currently with functional limitations due to the deficits listed below (see PT Problem List). Pt would benefit from acute skilled PT to address functional impairments. Pt will have 24/7 physical assist from daughter upon d/c from the hospital. Recommending post-acute HHPT to work towards independence with mobility. Acute PT to follow.         If plan is discharge home, recommend the following: A little help with walking and/or transfers;A little help with bathing/dressing/bathroom;Assist for transportation;Help with stairs or ramp for entrance;Assistance with cooking/housework   Can travel by private vehicle    Yes    Equipment Recommendations BSC/3in1;Rolling walker (2 wheels)  Recommendations for Other Services  OT consult    Functional Status Assessment Patient has had a recent decline in their functional status and demonstrates the ability to make significant improvements in function in a reasonable and predictable amount of time.     Precautions / Restrictions Precautions Precautions: Fall Recall of Precautions/Restrictions:  Impaired Precaution/Restrictions Comments: cues for NWB when standing Required Braces or Orthoses: Other Brace Other Brace: R post-op shoe Restrictions Weight Bearing Restrictions Per Provider Order: Yes RLE Weight Bearing Per Provider Order: Non weight bearing      Mobility  Bed Mobility Overal bed mobility: Modified Independent    General bed mobility comments: HOB elevated    Transfers Overall transfer level: Needs assistance Equipment used: Rolling walker (2 wheels) Transfers: Sit to/from Stand Sit to Stand: Contact guard assist, Min assist    General transfer comment: CGA for initial stand with pt using momentum to rock forwards. Cues to hold R LE in extension to adhere to WB precautions. MinA for boost-up from toilet and for second stand from EOB. Repeated cues to keep R LE in extension when standing    Ambulation/Gait Ambulation/Gait assistance: Contact guard assist Gait Distance (Feet): 15 Feet (x15, x15, x10) Assistive device: Rolling walker (2 wheels) Gait Pattern/deviations:  (hop-to) Gait velocity: decr     General Gait Details: hop to gait pattern with heavy usage of UE on RW, increased fatigue with increased distance. Required seated rest break prior to continuing to the recliner    Balance Overall balance assessment: Needs assistance, Mild deficits observed, not formally tested Sitting-balance support: No upper extremity supported, Feet supported Sitting balance-Leahy Scale: Good     Standing balance support: Bilateral upper extremity supported, During functional activity, Reliant on assistive device for balance Standing balance-Leahy Scale: Fair Standing balance comment: reliant on RW        Pertinent Vitals/Pain Pain Assessment Pain Assessment: Faces Faces Pain Scale: Hurts little more Pain Location: R foot  Pain Descriptors / Indicators: Aching, Discomfort Pain Intervention(s): Limited activity within patient's tolerance, Monitored during  session, Repositioned    Home Living Family/patient expects to be discharged to:: Private residence Living Arrangements: Children Available Help at Discharge: Family;Available 24 hours/day Type of Home: House Home Access: Level entry       Home Layout: One level Home Equipment: Other (comment);Shower seat - built in;Grab bars - tub/shower (knee scooter) Additional Comments: will be staying at daughters house, history above    Prior Function Prior Level of Function : Independent/Modified Independent    Mobility Comments: was heel weight bearing w/ no AD prior to admission, used knee scooter a few months prior ADLs Comments: independent     Extremity/Trunk Assessment   Upper Extremity Assessment Upper Extremity Assessment: Defer to OT evaluation    Lower Extremity Assessment Lower Extremity Assessment: RLE deficits/detail RLE Deficits / Details: bandaged from below toes to mid-calf, able to wiggle toes with limited ankle ROM 2/2 dressing. At least 3/5, deferred MMT 2/2 pain RLE Sensation: decreased light touch (decreased light touch on R toes)    Cervical / Trunk Assessment Cervical / Trunk Assessment: Normal  Communication   Communication Communication: No apparent difficulties    Cognition Arousal: Alert Behavior During Therapy: WFL for tasks assessed/performed   PT - Cognitive impairments: No apparent impairments    Following commands: Intact       Cueing Cueing Techniques: Verbal cues, Tactile cues     General Comments General comments (skin integrity, edema, etc.): VSS on RA, dressing dry and intact     PT Assessment Patient needs continued PT services  PT Problem List Decreased strength;Decreased activity tolerance;Decreased balance;Decreased mobility;Decreased knowledge of use of DME       PT Treatment Interventions DME instruction;Gait training;Functional mobility training;Therapeutic activities;Therapeutic exercise;Balance training;Neuromuscular  re-education;Patient/family education    PT Goals (Current goals can be found in the Care Plan section)  Acute Rehab PT Goals Patient Stated Goal: to be able to be independent with walking PT Goal Formulation: With patient Time For Goal Achievement: 09/07/23 Potential to Achieve Goals: Good    Frequency Min 2X/week        AM-PAC PT "6 Clicks" Mobility  Outcome Measure Help needed turning from your back to your side while in a flat bed without using bedrails?: None Help needed moving from lying on your back to sitting on the side of a flat bed without using bedrails?: None Help needed moving to and from a bed to a chair (including a wheelchair)?: A Little Help needed standing up from a chair using your arms (e.g., wheelchair or bedside chair)?: A Little Help needed to walk in hospital room?: A Little Help needed climbing 3-5 steps with a railing? : A Lot 6 Click Score: 19    End of Session Equipment Utilized During Treatment: Gait belt;Other (comment) (R post-op shoe) Activity Tolerance: Patient tolerated treatment well Patient left: in chair;with call bell/phone within reach Nurse Communication: Mobility status PT Visit Diagnosis: Unsteadiness on feet (R26.81);Other abnormalities of gait and mobility (R26.89)    Time: 4098-1191 PT Time Calculation (min) (ACUTE ONLY): 26 min   Charges:   PT Evaluation $PT Eval Low Complexity: 1 Low PT Treatments $Gait Training: 8-22 mins PT General Charges $$ ACUTE PT VISIT: 1 Visit        Hilton Cork, PT, DPT Secure Chat Preferred  Rehab Office [phone]   Arturo Morton Brion Aliment 08/24/2023, 3:03 PM

## 2023-08-24 NOTE — Progress Notes (Signed)
 PT going to surgery

## 2023-08-25 ENCOUNTER — Encounter (HOSPITAL_COMMUNITY): Payer: Self-pay | Admitting: Podiatry

## 2023-08-25 ENCOUNTER — Other Ambulatory Visit (HOSPITAL_COMMUNITY): Payer: Self-pay

## 2023-08-25 DIAGNOSIS — M869 Osteomyelitis, unspecified: Secondary | ICD-10-CM | POA: Diagnosis not present

## 2023-08-25 LAB — GLUCOSE, CAPILLARY
Glucose-Capillary: 259 mg/dL — ABNORMAL HIGH (ref 70–99)
Glucose-Capillary: 263 mg/dL — ABNORMAL HIGH (ref 70–99)

## 2023-08-25 LAB — SURGICAL PATHOLOGY

## 2023-08-25 MED ORDER — OXYCODONE-ACETAMINOPHEN 5-325 MG PO TABS
1.0000 | ORAL_TABLET | ORAL | 0 refills | Status: DC | PRN
  Filled 2023-08-25: qty 30, 5d supply, fill #0

## 2023-08-25 MED ORDER — INSULIN GLARGINE-YFGN 100 UNIT/ML ~~LOC~~ SOLN
20.0000 [IU] | Freq: Once | SUBCUTANEOUS | Status: AC
  Administered 2023-08-25: 20 [IU] via SUBCUTANEOUS
  Filled 2023-08-25: qty 0.2

## 2023-08-25 MED ORDER — AMOXICILLIN-POT CLAVULANATE 875-125 MG PO TABS
1.0000 | ORAL_TABLET | Freq: Two times a day (BID) | ORAL | 0 refills | Status: AC
  Filled 2023-08-25: qty 20, 10d supply, fill #0

## 2023-08-25 MED ORDER — INSULIN GLARGINE-YFGN 100 UNIT/ML ~~LOC~~ SOLN: 50.0000 [IU] | Freq: Every day | SUBCUTANEOUS | Status: DC

## 2023-08-25 NOTE — Progress Notes (Signed)
    Durable Medical Equipment  (From admission, onward)           Start     Ordered   08/25/23 0959  For home use only DME Bedside commode  Once       Comments: Needs 3:1 next to her bedside at home.  Question:  Patient needs a bedside commode to treat with the following condition  Answer:  Amputation of great toe [facility])   08/25/23 0959   08/25/23 0958  For home use only DME Walker rolling  Once       Question Answer Comment  Walker: With 5 Inch Wheels   Patient needs a walker to treat with the following condition Amputation of great toe (HCC)      08/25/23 1607

## 2023-08-25 NOTE — Care Management Important Message (Signed)
 Important Message  Patient Details  Name: Amy Garner MRN: [id number] Date of Birth: 05-25-1955   Important Message Given:  Yes - Medicare IM     Dorena Bodo 08/25/2023, 2:16 PM

## 2023-08-25 NOTE — TOC Progression Note (Signed)
 Transition of Care [facility]) - Progression Note    Patient Details  Name: Amy Garner MRN: [id number] Date of Birth: 05-May-1956  Transition of [facility]) CM/SW Contact  Janae Bridgeman, RN Phone Number: 08/25/2023, 10:24 AM  Clinical Narrative:    CM offered Medicare choice to patient yesterday regarding DME company and she did not have a preference.  I called [employer] and requested RW and 3:1 to be delivered to the hospital room today.  HH orders and DME note was placed to be co-signed by the MD.   Expected Discharge Plan: Home w Home Health Services Barriers to Discharge: Continued Medical Work up  Expected Discharge Plan and Services   Discharge Planning Services: CM Consult Post Acute Care Choice: Home Health Living arrangements for the past 2 months: Single Family Home Expected Discharge Date: 08/25/23                         HH Arranged: OT, PT           Social Determinants of Health (SDOH) Interventions SDOH Screenings   Food Insecurity: No Food Insecurity (08/21/2023)  Recent Concern: Food Insecurity - Food Insecurity Present (07/17/2023)  Housing: Low Risk  (08/21/2023)  Transportation Needs: No Transportation Needs (08/21/2023)  Utilities: Not At Risk (08/21/2023)  Alcohol Screen: Low Risk  (07/17/2023)  Depression (PHQ2-9): Low Risk  (07/21/2023)  Financial Resource Strain: Low Risk  (07/17/2023)  Physical Activity: Insufficiently Active (07/17/2023)  Social Connections: Moderately Isolated (08/21/2023)  Stress: No Stress Concern Present (07/17/2023)  Tobacco Use: Medium Risk (08/24/2023)  Health Literacy: Adequate Health Literacy (02/03/2023)    Readmission Risk Interventions    08/24/2023    4:45 PM  Readmission Risk Prevention Plan  Post Dischage Appt Complete  Medication Screening Complete  Transportation Screening Complete

## 2023-08-25 NOTE — Progress Notes (Signed)
 Physical Therapy Treatment Patient Details Name: Amy Garner MRN: [id number] DOB: 14-Feb-1956 Today's Date: 08/25/2023   History of Present Illness 68 y.o. female presents to [facility] 08/21/23 with ulcer and drainage on right first MPT. 4/7 partial first ray amputation on R foot. 4/9 repeat I&D on R foot. PMHx: DMT2, HTN, GERD, depression, hyperlipidemia, anxiety, asthma, osteoarthritis of the first MTP    PT Comments  Pt received in supine after working with OT, agreeable to therapy session with encouragement, reporting moderate pain after RN premedicated her. Pt performs transfers to/from knee scooter with CGA to minA from slightly elevated surface, needing slightly higher surface due to RLE NWB precs. Pt propels knee scooter in room short distance with good understanding of safety and PTA reviews use of brakes/locks and safe body mechanics, pt receptive to instruction. Discussed bed set-up options for home as pt will be in new twin bed at her daughter's house temporarily, discussed lifting bed if too low for safe transfers given current RLE NWB restrictions. Pt continues to benefit from PT services to progress toward functional mobility goals, continue to recommend HHPT.     If plan is discharge home, recommend the following: A little help with walking and/or transfers;A little help with bathing/dressing/bathroom;Assist for transportation;Help with stairs or ramp for entrance;Assistance with cooking/housework   Can travel by private vehicle        Equipment Recommendations  BSC/3in1;Rolling walker (2 wheels)    Recommendations for Other Services       Precautions / Restrictions Precautions Precautions: Fall Recall of Precautions/Restrictions: Impaired Precaution/Restrictions Comments: cues for NWB when standing Required Braces or Orthoses: Other Brace Other Brace: R post-op shoe Restrictions Weight Bearing Restrictions Per Provider Order: Yes RLE Weight Bearing Per Provider Order: Non  weight bearing     Mobility  Bed Mobility Overal bed mobility: Modified Independent             General bed mobility comments: HOB partially elevated, pt does not use rails    Transfers Overall transfer level: Needs assistance Equipment used:  (knee scooter) Transfers: Sit to/from Stand, Bed to chair/wheelchair/BSC Sit to Stand: Min assist, From elevated surface Stand pivot transfers: Contact guard assist         General transfer comment: From lowest bed height, pt attempting to stand with Supervision but not able to lift hips off bed. Bed height elevated ~2-3" and pt able to stand with CGA/light minA just to ensure RLE NWB precs. CGA once standing to guide her safely to pivot to knee scooter.    Ambulation/Gait Ambulation/Gait assistance: Contact guard assist, Supervision Gait Distance (Feet): 10 Feet Assistive device: Knee scooter Gait Pattern/deviations: WFL(Within Functional Limits)       General Gait Details: pt navigated a circle in room at bedside using knee scooter to practice propelling forward and turning in narrow spaces. No overt LOB or instability observed; CGA intermittently for safety but mostly Supervision.   Stairs Stairs:  (N/A)           Wheelchair Mobility     Tilt Bed    Modified Rankin (Stroke Patients Only)       Balance Overall balance assessment: Needs assistance, Mild deficits observed, not formally tested Sitting-balance support: No upper extremity supported, Feet supported Sitting balance-Leahy Scale: Good     Standing balance support: Bilateral upper extremity supported, During functional activity, Reliant on assistive device for balance, No upper extremity supported Standing balance-Leahy Scale: Poor Standing balance comment: reliant on AD due to RLE  NWB precs                            Communication Communication Communication: No apparent difficulties  Cognition Arousal: Alert Behavior During Therapy:  WFL for tasks assessed/performed   PT - Cognitive impairments: No apparent impairments                         Following commands: Intact      Cueing Cueing Techniques: Verbal cues, Tactile cues  Exercises      General Comments General comments (skin integrity, edema, etc.): No acute s/sx distress, VSS per chart review; RLE distal edema, surgical dressings appear c/d/i      Pertinent Vitals/Pain Pain Assessment Pain Assessment: 0-10 Pain Score: 5  Pain Location: R foot Pain Descriptors / Indicators: Aching, Discomfort, Guarding Pain Intervention(s): Limited activity within patient's tolerance, Monitored during session, Premedicated before session, Repositioned, Other (comment) (instruction on use/freq for ice PRN, pt defers today)    Home Living Family/patient expects to be discharged to:: Private residence Living Arrangements: Children (going to stay at daughter's home) Available Help at Discharge: Family;Available 24 hours/day Type of Home: House Home Access: Level entry       Home Layout: One level Home Equipment: Shower seat - built in;Grab bars - tub/shower (knee scooter)      Prior Function            PT Goals (current goals can now be found in the care plan section) Acute Rehab PT Goals Patient Stated Goal: to be able to be independent with walking PT Goal Formulation: With patient Time For Goal Achievement: 09/07/23 Progress towards PT goals: Progressing toward goals    Frequency    Min 2X/week      PT Plan      Co-evaluation              AM-PAC PT "6 Clicks" Mobility   Outcome Measure  Help needed turning from your back to your side while in a flat bed without using bedrails?: None Help needed moving from lying on your back to sitting on the side of a flat bed without using bedrails?: None Help needed moving to and from a bed to a chair (including a wheelchair)?: A Little Help needed standing up from a chair using your arms  (e.g., wheelchair or bedside chair)?: A Little Help needed to walk in hospital room?: A Little Help needed climbing 3-5 steps with a railing? : Total 6 Click Score: 18    End of Session Equipment Utilized During Treatment: Gait belt;Other (comment) (R post-op shoe) Activity Tolerance: Patient tolerated treatment well;Other (comment) (pt eager to DC and defers hallway distance mobility) Patient left: in bed;with call bell/phone within reach;with bed alarm set;Other (comment);with nursing/sitter in room (RN in room, RLE elevated with heel floated due to significant edema) Nurse Communication: Mobility status;Weight bearing status PT Visit Diagnosis: Unsteadiness on feet (R26.81);Other abnormalities of gait and mobility (R26.89)     Time: 4098-1191 PT Time Calculation (min) (ACUTE ONLY): 17 min  Charges:    $Therapeutic Activity: 8-22 mins PT General Charges $$ ACUTE PT VISIT: 1 Visit                     Icelyn Navarrete P., PTA Acute Rehabilitation Services Secure Chat Preferred 9a-5:30pm Office: [phone]    Dorathy Kinsman [facility] 08/25/2023, 12:10 PM

## 2023-08-25 NOTE — Progress Notes (Signed)
 Patient awaiting equipment to be delivered. Discharge paper work and instructions reviewed with patient. Patient voice no concerns. Patient medications picked up from pharmacy and given to patient. Patient voiced no questions or concerns. Awaiting equipment and ride.

## 2023-08-25 NOTE — Progress Notes (Addendum)
  Subjective:  Patient ID: Amy Garner, female    DOB: 07/15/1955,  MRN: [id number]  Chief Complaint  Patient presents with   Diabetic Ulcer    DOS: 08/24/2023  Procedure:  1.  Repeat irrigation and excisional debridement with prep for graft, right foot 2.  Application dermal allograft 76 cm2, right foot 3.  Application dissolvable antibiotic beads, right foot 4.  Delayed primary closure of surgical site 7 x 3 cm, right foot    68 y.o. female seen for post op check. She reports doing well, had some pain overnight but doing better after a pain pill this AM. Discussed findings from surgery and plan going forward.   Review of Systems: Negative except as noted in the HPI. Denies N/V/F/Ch.   Objective:   Vitals:   08/25/23 0446 08/25/23 0746  BP: 114/65 (!) 119/48  Pulse: 63 63  Resp: 18   Temp: 98.9 F (37.2 C)   SpO2: 97% 97%   Body mass index is 39.77 kg/m. Constitutional Well developed. Well nourished.  Vascular Foot warm and well perfused. Capillary refill normal to all digits.   No calf pain with palpation  Neurologic Normal speech. Oriented to person, place, and time. Epicritic sensation intact to Right foot  Dermatologic Amputation site healing well, no erythema or drainage well coapted no evidence residual infection   Orthopedic: S/p partial first ray amputation R   Radiographs:  transmetatarsal amputation of the first ray.  Pathology: A. BONE, RIGHT FOOT PROXIMAL MARGIN, BIOPSY:  Benign bone and connective tissue.  Negative for osteomyelitis.   B. TOE, RIGHT GREAT, AMPUTATION:  Ulcer with inflammation and granulation tissue.  Acute osteomyelitis.   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  POD # 1 s/p repeat I&D, with abx beads graft application and delayed primary closure of site. -Progressing as  expected post op, working on pain control - Appears to have clean margin at 1st met based on path results.  -XR: Expected post op changes -WB Status: NWB in soft dressing/ Post op shoe -Sutures: Remain intact 2-3 weeks. -Medications/ABX: Recommend Augmentin BID for 10 days on DC -Dressing changed this AM. Leave intact until follow up. - Follow up next week with Dr. Allena Katz Wednesday office to call.   Will sign off at this time, pt stable for Dc home later today from my standpoint        Corinna Gab, DPM [facility] / [facility]

## 2023-08-25 NOTE — Evaluation (Signed)
 Occupational Therapy Evaluation Patient Details Name: Amy Garner MRN: [id number] DOB: 1955/05/23 Today's Date: 08/25/2023   History of Present Illness   68 y.o. female [facility] 08/21/23 with ulcer and drainage on right first MPT. 4/7 partial first ray amputation on R foot. 4/9 repeat I&D on R foot. PMHx: DMT2, HTN, GERD, depression, hyperlipidemia, anxiety, asthma, osteoarthritis of the first MTP     Clinical Impressions Pt reports ind at baseline with ADLs was heel weightbearing and using a knee scooter PTA. Pt typically lives with grandson but plans to stay with her daughter at d/c to have more assist. Pt currently needing up to mod A for ADLs, mod I for bed mobility and CGA for transfers with RW. Pt adhering well to weightbearing precautions and aware of need to don postop shoe when OOB. Discussed use of 3in1 over toilet at home, and compensatory strategies for bathing. Pt presenting with impairments listed below, will follow acutely. Recommend HHOT at d/c.      If plan is discharge home, recommend the following:   A little help with walking and/or transfers;A little help with bathing/dressing/bathroom;Assistance with cooking/housework;Assist for transportation;Help with stairs or ramp for entrance     Functional Status Assessment         Equipment Recommendations   BSC/3in1;Other (comment) (RW)     Recommendations for Other Services   PT consult     Precautions/Restrictions   Precautions Precautions: Fall Recall of Precautions/Restrictions: Impaired Precaution/Restrictions Comments: cues for NWB when standing Required Braces or Orthoses: Other Brace Other Brace: R post-op shoe Restrictions Weight Bearing Restrictions Per Provider Order: Yes RLE Weight Bearing Per Provider Order: Non weight bearing     Mobility Bed Mobility Overal bed mobility: Modified Independent             General bed mobility comments: HOB elevated     Transfers Overall transfer level: Needs assistance Equipment used: Rolling walker (2 wheels) Transfers: Sit to/from Stand Sit to Stand: Contact guard assist, Min assist                  Balance Overall balance assessment: Needs assistance, Mild deficits observed, not formally tested Sitting-balance support: No upper extremity supported, Feet supported Sitting balance-Leahy Scale: Good     Standing balance support: Bilateral upper extremity supported, During functional activity, Reliant on assistive device for balance Standing balance-Leahy Scale: Fair Standing balance comment: reliant on RW                           ADL either performed or assessed with clinical judgement   ADL Overall ADL's : Needs assistance/impaired Eating/Feeding: Independent   Grooming: Supervision/safety;Standing;Sitting   Upper Body Bathing: Minimal assistance;Sitting   Lower Body Bathing: Minimal assistance;Sitting/lateral leans   Upper Body Dressing : Minimal assistance;Sitting   Lower Body Dressing: Minimal assistance;Sitting/lateral leans   Toilet Transfer: Contact guard assist;Ambulation;Rolling walker (2 wheels);Regular Toilet   Toileting- Clothing Manipulation and Hygiene: Moderate assistance       Functional mobility during ADLs: Contact guard assist;Rolling walker (2 wheels)       Vision   Vision Assessment?: No apparent visual deficits     Perception Perception: Not tested       Praxis Praxis: Not tested       Pertinent Vitals/Pain Pain Assessment Pain Assessment: Faces Pain Score: 5  Pain Location: R foot Pain Descriptors / Indicators: Aching, Discomfort Pain Intervention(s): Limited activity within patient's tolerance, Monitored during  session, Repositioned     Extremity/Trunk Assessment     Lower Extremity Assessment Lower Extremity Assessment: Defer to PT evaluation       Communication Communication Communication: No apparent  difficulties   Cognition Arousal: Alert Behavior During Therapy: WFL for tasks assessed/performed Cognition: No apparent impairments                               Following commands: Intact       Cueing  General Comments   Cueing Techniques: Verbal cues;Tactile cues  VSS on RA   Exercises     Shoulder Instructions      Home Living Family/patient expects to be discharged to:: Private residence Living Arrangements: Children (going to stay at daughter's home) Available Help at Discharge: Family;Available 24 hours/day Type of Home: House Home Access: Level entry     Home Layout: One level     Bathroom Shower/Tub: Producer, television/film/video: Standard Bathroom Accessibility: Yes   Home Equipment: Shower seat - built in;Grab bars - tub/shower (knee scooter)          Prior Functioning/Environment Prior Level of Function : Independent/Modified Independent             Mobility Comments: was heel weight bearing w/ no AD prior to admission, used knee scooter a few months prior ADLs Comments: independent    OT Problem List:     OT Treatment/Interventions:        OT Goals(Current goals can be found in the care plan section)       OT Frequency:  Min 2X/week    Co-evaluation              AM-PAC OT "6 Clicks" Daily Activity     Outcome Measure Help from another person eating meals?: A Little Help from another person taking care of personal grooming?: A Little Help from another person toileting, which includes using toliet, bedpan, or urinal?: A Little Help from another person bathing (including washing, rinsing, drying)?: A Little Help from another person to put on and taking off regular upper body clothing?: A Little Help from another person to put on and taking off regular lower body clothing?: A Little 6 Click Score: 18   End of Session Equipment Utilized During Treatment: Gait belt;Rolling walker (2 wheels) Nurse  Communication: Mobility status  Activity Tolerance: Patient tolerated treatment well Patient left: in bed;with call bell/phone within reach;with bed alarm set  OT Visit Diagnosis: Unsteadiness on feet (R26.81);Other abnormalities of gait and mobility (R26.89);Muscle weakness (generalized) (M62.81)                Time: 7106-2694 OT Time Calculation (min): 22 min Charges:  OT General Charges $OT Visit: 1 Visit OT Evaluation $OT Eval Moderate Complexity: 1 Mod  Bettylou Frew K, OTD, OTR/L [employer] [facility] [phone]  Remedy Corporan K Koonce 08/25/2023, 10:17 AM

## 2023-08-25 NOTE — Plan of Care (Signed)

## 2023-08-25 NOTE — Discharge Summary (Addendum)
 Physician Discharge Summary  Amy Garner GNF:[id number] DOB: 06-26-1955 DOA: 08/21/2023  PCP: Georganna Skeans, MD  Admit date: 08/21/2023 Discharge date: 08/25/2023 30 Day Unplanned Readmission Risk Score    Flowsheet Row ED to Hosp-Admission (Current) from 08/21/2023 in [facility]  30 Day Unplanned Readmission Risk Score (%) 7.86 Filed at 08/25/2023 0801       This score is the patient's risk of an unplanned readmission within 30 days of being discharged (0 -100%). The score is based on dignosis, age, lab data, medications, orders, and past utilization.   Low:  0-14.9   Medium: 15-21.9   High: 22-29.9   Extreme: 30 and above          Admitted From: Home Disposition: Home  Recommendations for Outpatient Follow-up:  Follow up with PCP in 1-2 weeks Please obtain BMP/CBC in one week Follow-up with podiatry in 7 to 10 days Please follow up with your PCP on the following pending results: Unresulted Labs (From admission, onward)    None         Home Health: Yes Equipment/Devices: Bedside commode and walker  Discharge Condition: Stable CODE STATUS: Full code Diet recommendation: Diabetic  Subjective: Seen and examined.  No complaints.  She is excited to go home.  Brief/Interim Summary: Amy Garner is a 68 y.o. female with past medical history diabetes mellitus type 2, essential hypertension, peptic ulcer disease, GERD, depression, hyperlipidemia, anxiety, asthma, osteoarthritis of the first MTP presented with pain and erythema swelling initially on the dorsum of her foot that eventually ruptured leaving an ulcer and drainage where she had surgery.  No reports of chest pain shortness of breath headaches dizziness falls.  Podiatry consulted.  Details below.   Diabetic wound and osteomyelitis involving distal phalanx of the right great toe/cellulitis, POA: Started on broad-spectrum antibiotics including Rocephin, Flagyl and vancomycin, seen by podiatry,  underwent right foot reimplantation with application of the wound VAC on 08/22/2023.  ABI completed 08/23/2023 shows normal ABI on the right lower extremity but mild arterial disease on the left lower extremity.  Patient underwent repeat irrigation and excisional debridement and application of dissolvable antibiotic beads right foot with delayed primary closure of her surgical site on 08/24/2023.  Wound cultures growing group B streptococci, final sensitivities pending.  Seen by podiatry, I personally discussed with Dr. Jacqulyn Liner who has cleared the patient for discharge and he will follow-up with her in the office in 7 to 10 days and recommended discharging on Augmentin for 10 days.  Prescribed her opioids for pain management as well.   Type 2 diabetes mellitus: PTA meds includes Lantus 62 units daily and metformin.  Resume home medications.   Hyperlipidemia: Continue statin.   Essential hypertension: PTA losartan and Dyazide which is on hold and blood pressure controlled.   GERD: Continue PPI.   Anxiety/depression: Resume Prozac.  Discharge plan was discussed with patient and/or family member and they verbalized understanding and agreed with it.  Discharge Diagnoses:  Active Problems:   Hypertension   Peptic ulcer   Type 2 diabetes mellitus (HCC)   GERD (gastroesophageal reflux disease)   Osteomyelitis (HCC)   Osteomyelitis of toe of right foot (HCC)    Discharge Instructions   Allergies as of 08/25/2023       Reactions   Atenolol Anaphylaxis   Codeine Other (See Comments)   Pt says she gets "crazy"        Medication List     STOP taking  these medications    doxycycline 100 MG tablet Commonly known as: VIBRA-TABS   nitrofurantoin (macrocrystal-monohydrate) 100 MG capsule Commonly known as: Macrobid       TAKE these medications    albuterol 108 (90 Base) MCG/ACT inhaler Commonly known as: VENTOLIN HFA Inhale 2 puffs into the lungs every 6 (six) hours as needed for  wheezing or shortness of breath.   Alcohol Swabs Pads USE TO TAKE BLOOD SUGAR 2 TIMES A DAY   amoxicillin-clavulanate 875-125 MG tablet Commonly known as: AUGMENTIN Take 1 tablet by mouth 2 (two) times daily for 10 days.   cholecalciferol 25 MCG (1000 UNIT) tablet Commonly known as: VITAMIN D3 Take 1,000 Units by mouth daily.   esomeprazole 40 MG capsule Commonly known as: NEXIUM TAKE 1 CAPSULE (40 MG TOTAL) BY MOUTH IN THE MORNING.   FLUoxetine 20 MG capsule Commonly known as: PROZAC Take 1 capsule (20 mg total) by mouth daily.   ibuprofen 800 MG tablet Commonly known as: ADVIL Take 1 tablet (800 mg total) by mouth every 6 (six) hours as needed. What changed: Another medication with the same name was removed. Continue taking this medication, and follow the directions you see here.   losartan 25 MG tablet Commonly known as: COZAAR TAKE 1 TABLET EVERY DAY   lovastatin 10 MG tablet Commonly known as: MEVACOR TAKE 1 TABLET AT BEDTIME   meclizine 25 MG tablet Commonly known as: ANTIVERT TAKE 1 TABLET (25 MG TOTAL) BY MOUTH 3 (THREE) TIMES DAILY AS NEEDED FOR DIZZINESS.   metFORMIN 500 MG 24 hr tablet Commonly known as: GLUCOPHAGE-XR TAKE 2 TABLETS twice daily with meals.   Misc. Devices Misc TRUE MATRIX METER   oxyCODONE-acetaminophen 5-325 MG tablet Commonly known as: Percocet Take 1 tablet by mouth every 4 (four) hours as needed for severe pain (pain score 7-10).   Ozempic (2 MG/DOSE) 8 MG/3ML Sopn Generic drug: Semaglutide (2 MG/DOSE) INJECT 2MG  UNDER THE SKIN ONE TIME WEEKLY AS DIRECTED   Pen Needles 32G X 4 MM Misc Use to inject insulin once daily.   Toujeo SoloStar 300 UNIT/ML Solostar Pen Generic drug: insulin glargine (1 Unit Dial) INJECT 62 UNITS INTO THE SKIN DAILY.   triamterene-hydrochlorothiazide 37.5-25 MG capsule Commonly known as: DYAZIDE TAKE 1 CAPSULE EVERY DAY   True Metrix Blood Glucose Test test strip Generic drug: glucose blood Use  as instructed   glucose blood test strip Use as instructed   TRUEplus Lancets 33G Misc USE TO TAKE BLOOD SUGAR 2 TIMES A DAY               Durable Medical Equipment  (From admission, onward)           Start     Ordered   08/25/23 0959  For home use only DME Bedside commode  Once       Comments: Needs 3:1 next to her bedside at home.  Question:  Patient needs a bedside commode to treat with the following condition  Answer:  Amputation of great toe [facility])   08/25/23 0959   08/25/23 0958  For home use only DME Walker rolling  Once       Question Answer Comment  Walker: With 5 Inch Wheels   Patient needs a walker to treat with the following condition Amputation of great toe (HCC)      08/25/23 0959            Follow-up Information     [facility] Follow up.  Specialty: Home Health Services Why: [facility] will provide home health services.  They will call you in the next 1-2 days after discharge to provide home health services. Contact information: [address] [place] [address] [phone]         Georganna Skeans, MD Follow up in 1 week(s).   Specialty: Family Medicine Contact information: [address] [place] [address] [phone]         [facility] Follow up.   Why: [employer] will deliver a RW and 3:1 to the hospital room prior to discharge to home. Contact information: [address] [address] [phone]                Allergies  Allergen Reactions   Atenolol Anaphylaxis   Codeine Other (See Comments)    Pt says she gets "crazy"    Consultations: Podiatry   Procedures/Studies: VAS [place] ABI WITH/WO TBI Result Date: 08/23/2023  LOWER EXTREMITY DOPPLER STUDY Patient Name:  ALTAGRACIA RONE  Date of Exam:   08/23/2023 Medical Rec #: [id number]           Accession #:    [id number] Date of Birth: 12-17-1955           Patient Gender: F Patient Age:   42  years Exam Location:  [facility] Procedure:      VAS [place] ABI WITH/WO TBI Referring Phys: Ovid Curd --------------------------------------------------------------------------------  Indications: Ulceration. Post-op- right great toe amputation. High Risk Factors: Hypertension, hyperlipidemia, Diabetes.  Limitations: Today's exam was limited due to bandages. Performing Technologist: Fernande Bras  Examination Guidelines: A complete evaluation includes at minimum, Doppler waveform signals and systolic blood pressure reading at the level of bilateral brachial, anterior tibial, and posterior tibial arteries, when vessel segments are accessible. Bilateral testing is considered an integral part of a complete examination. Photoelectric Plethysmograph (PPG) waveforms and toe systolic pressure readings are included as required and additional duplex testing as needed. Limited examinations for reoccurring indications may be performed as noted.  ABI Findings: +---------+------------------+-----+---------+----------+ Right    Rt Pressure (mmHg)IndexWaveform Comment    +---------+------------------+-----+---------+----------+ Brachial 142                    triphasic           +---------+------------------+-----+---------+----------+ PTA      137               0.90 triphasic           +---------+------------------+-----+---------+----------+ DP       148               0.97 triphasic           +---------+------------------+-----+---------+----------+ Great Toe78                0.51 Abnormal 2nd digit. +---------+------------------+-----+---------+----------+ +---------+------------------+-----+---------+-------+ Left     Lt Pressure (mmHg)IndexWaveform Comment +---------+------------------+-----+---------+-------+ Brachial 152                    triphasic        +---------+------------------+-----+---------+-------+ PTA      126               0.83 triphasic         +---------+------------------+-----+---------+-------+ DP       130               0.86 triphasic        +---------+------------------+-----+---------+-------+  Great Toe97                0.64 Abnormal         +---------+------------------+-----+---------+-------+ +-------+-----------+-----------+------------+------------+ ABI/TBIToday's ABIToday's TBIPrevious ABIPrevious TBI +-------+-----------+-----------+------------+------------+ Right  0.97       0.51                                +-------+-----------+-----------+------------+------------+ Left   0.86       0.64                                +-------+-----------+-----------+------------+------------+  Summary: Right: Resting right ankle-brachial index is within normal range. The right toe-brachial index is abnormal. Left: Resting left ankle-brachial index indicates mild left lower extremity arterial disease. The left toe-brachial index is abnormal. *See table(s) above for measurements and observations.  Electronically signed by Lemar Livings MD on 08/23/2023 at 7:48:50 PM.    Final    DG Foot 2 Views Right Result Date: 08/22/2023 CLINICAL DATA:  Postop. 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 Antibiotic beads at the operative bed. Overlying wound VAC in place. Hammertoe deformity of the toes. No evidence of acute fracture. 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 Electronically Signed   By: Narda Rutherford M.D.   On: 08/22/2023 16:03   CT FOOT RIGHT W CONTRAST Result Date: 08/21/2023 CLINICAL DATA:  History of surgery involving the right great toe with persistent pain, swelling and open wound. Abnormal radiographs. EXAM: CT OF THE LOWER RIGHT EXTREMITY WITH CONTRAST TECHNIQUE: Multidetector CT imaging of the lower right extremity was performed according to the standard protocol following intravenous contrast  administration. RADIATION DOSE REDUCTION: This exam was performed according to the departmental dose-optimization program which includes automated exposure control, adjustment of the mA and/or kV according to patient size and/or use of iterative reconstruction technique. CONTRAST:  75mL OMNIPAQUE IOHEXOL 350 MG/ML SOLN COMPARISON:  Multiple prior radiographs. The most recent of 08/21/2023. FINDINGS: Definite significant change when when comparing the radiographs from 08/10/2023 and 08/21/2023. The CT scan confirms extensive osteolysis involving the distal phalanx of the great toe along with lucency surrounding the headless screw and marked joint space widening at the interphalangeal joint. Findings consistent with septic arthritis and osteomyelitis. I do not see any definite findings for osteomyelitis involving the first metatarsal head and the hardware in this area is intact. Associated significant changes of cellulitis with a large open wound noted on the dorsum of the foot overlying the great toe, centered at the fused MTP joint. Diffuse fatty atrophy of the foot musculature without definite findings for pyomyositis. The other bony structures are intact. There are advanced degenerative changes involving the interphalangeal joints of the foot with areas of partial fusion. The tibiotalar and subtalar joints are maintained. No midfoot or hindfoot bony abnormalities. IMPRESSION: 1. CT scan confirms extensive osteomyelitis involving the distal phalanx of the great toe. Significant interval widening at the interphalangeal joint consistent with septic arthritis. 2. Associated significant changes of cellulitis with a large open wound noted on the dorsum of the foot overlying the great toe, centered at the fused MTP joint. 3. Diffuse fatty atrophy of the foot musculature without definite findings for pyomyositis. 4. Advanced degenerative changes involving the interphalangeal joints of the foot with areas of partial  fusion. Electronically Signed  By: Rudie Meyer M.D.   On: 08/21/2023 18:39   DG Foot Complete Right Result Date: 08/21/2023 CLINICAL DATA:  Wound. EXAM: RIGHT FOOT COMPLETE - 3+ VIEW COMPARISON:  Right foot radiographs 08/10/2023. FINDINGS: Arthrodesis the first MTP joint is again noted. Progressive lucency is noted about the distal screw across the IP joint. Osteolysis is noted within the distal phalanx in the distal aspect of the proximal phalanx. Extensive edema and gas is present within the great toe. Diffuse soft tissue swelling is present over the dorsum of the foot. IMPRESSION: 1. Progressive lucency about the distal screw across the IP joint of the great toe with osteolysis within the distal phalanx and distal aspect of the proximal phalanx. Findings are concerning for osteomyelitis. 2. Extensive edema and gas within the great toe. 3. Diffuse soft tissue swelling over the dorsum of the foot. Electronically Signed   By: Marin Roberts M.D.   On: 08/21/2023 13:58   DG Foot Complete Right Result Date: 08/10/2023 Please see detailed radiograph report in office note.    Discharge Exam: Vitals:   08/25/23 0446 08/25/23 0746  BP: 114/65 (!) 119/48  Pulse: 63 63  Resp: 18   Temp: 98.9 F (37.2 C)   SpO2: 97% 97%   Vitals:   08/24/23 2056 08/25/23 0446 08/25/23 0500 08/25/23 0746  BP: 114/62 114/65  (!) 119/48  Pulse: 66 63  63  Resp: 17 18    Temp: 98.1 F (36.7 C) 98.9 F (37.2 C)    TempSrc: Oral Oral    SpO2: 97% 97%  97%  Weight:   111.8 kg   Height:        General: Pt is alert, awake, not in acute distress Cardiovascular: RRR, S1/S2 +, no rubs, no gallops Respiratory: CTA bilaterally, no wheezing, no rhonchi Abdominal: Soft, NT, ND, bowel sounds + Extremities: no edema, no cyanosis, dressing in the right foot.    The results of significant diagnostics from this hospitalization (including imaging, microbiology, ancillary and laboratory) are listed below for  reference.     Microbiology: Recent Results (from the past 240 hours)  Blood Cultures x 2 sites     Status: None (Preliminary result)   Collection Time: 08/21/23  2:26 PM   Specimen: BLOOD RIGHT ARM  Result Value Ref Range Status   Specimen Description BLOOD RIGHT ARM  Final   Special Requests   Final    BOTTLES DRAWN AEROBIC AND ANAEROBIC Blood Culture adequate volume   Culture   Final    NO GROWTH 4 DAYS Performed at [facility], [address], [place], [place] [address]    Report Status PENDING  Incomplete  Blood Cultures x 2 sites     Status: None (Preliminary result)   Collection Time: 08/21/23  2:31 PM   Specimen: BLOOD RIGHT FOREARM  Result Value Ref Range Status   Specimen Description BLOOD RIGHT FOREARM  Final   Special Requests   Final    BOTTLES DRAWN AEROBIC AND ANAEROBIC Blood Culture adequate volume   Culture   Final    NO GROWTH 4 DAYS Performed at [facility], [address] [place], [place] [address]    Report Status PENDING  Incomplete  Aerobic/Anaerobic Culture w Gram Stain (surgical/deep wound)     Status: None (Preliminary result)   Collection Time: 08/22/23  2:12 PM   Specimen: Soft Tissue, Other  Result Value Ref Range Status   Specimen Description TISSUE  Final  Special Requests right foot  Final   Gram Stain   Final    ABUNDANT WBC PRESENT, PREDOMINANTLY PMN FEW GRAM POSITIVE COCCI IN PAIRS Performed at [facility], [address] [place], [place] [address]    Culture   Final    ABUNDANT GROUP B STREP(S.AGALACTIAE)ISOLATED TESTING AGAINST S. AGALACTIAE NOT ROUTINELY PERFORMED DUE TO PREDICTABILITY OF AMP/PEN/VAN SUSCEPTIBILITY. NO ANAEROBES ISOLATED; CULTURE IN PROGRESS FOR 5 DAYS    Report Status PENDING  Incomplete  Aerobic/Anaerobic Culture w Gram Stain (surgical/deep wound)     Status: None (Preliminary result)   Collection Time: 08/22/23  2:18 PM   Specimen: Soft Tissue, Other  Result Value Ref Range Status    Specimen Description TISSUE  Final   Special Requests bone proximal phalange  Final   Gram Stain   Final    ABUNDANT WBC PRESENT, PREDOMINANTLY PMN RARE GRAM POSITIVE COCCI IN PAIRS Performed at [facility], [address], [place], [place] [address]    Culture   Final    MODERATE GROUP B STREP(S.AGALACTIAE)ISOLATED TESTING AGAINST S. AGALACTIAE NOT ROUTINELY PERFORMED DUE TO PREDICTABILITY OF AMP/PEN/VAN SUSCEPTIBILITY. CULTURE REINCUBATED FOR BETTER GROWTH NO ANAEROBES ISOLATED; CULTURE IN PROGRESS FOR 5 DAYS    Report Status PENDING  Incomplete     Labs: BNP (last 3 results) No results for input(s): "BNP" in the last 8760 hours. Basic Metabolic Panel: Recent Labs  Lab 08/21/23 1247 08/22/23 0625  NA 133* 137  K 4.0 3.7  CL 96* 102  CO2 24 23  GLUCOSE 404* 141*  BUN 18 19  CREATININE 0.89 0.87  CALCIUM 9.8 9.0   Liver Function Tests: Recent Labs  Lab 08/21/23 1837 08/22/23 0625  AST 31 25  ALT 31 28  ALKPHOS 87 75  BILITOT 0.5 0.4  PROT 6.8 5.9*  ALBUMIN 3.0* 2.6*   No results for input(s): "LIPASE", "AMYLASE" in the last 168 hours. No results for input(s): "AMMONIA" in the last 168 hours. CBC: Recent Labs  Lab 08/21/23 1247 08/22/23 0625  WBC 10.7* 8.8  NEUTROABS 6.5  --   HGB 13.6 11.6*  HCT 41.3 35.7*  MCV 86.0 85.6  PLT 303 237   Cardiac Enzymes: No results for input(s): "CKTOTAL", "CKMB", "CKMBINDEX", "TROPONINI" in the last 168 hours. BNP: Invalid input(s): "POCBNP" CBG: Recent Labs  Lab 08/24/23 0914 08/24/23 1117 08/24/23 1642 08/24/23 2020 08/25/23 0741  GLUCAP 251* 255* 299* 325* 259*   D-Dimer No results for input(s): "DDIMER" in the last 72 hours. Hgb A1c No results for input(s): "HGBA1C" in the last 72 hours. Lipid Profile No results for input(s): "CHOL", "HDL", "LDLCALC", "TRIG", "CHOLHDL", "LDLDIRECT" in the last 72 hours. Thyroid function studies No results for input(s): "TSH", "T4TOTAL", "T3FREE", "THYROIDAB" in  the last 72 hours.  Invalid input(s): "FREET3" Anemia work up No results for input(s): "VITAMINB12", "FOLATE", "FERRITIN", "TIBC", "IRON", "RETICCTPCT" in the last 72 hours. Urinalysis No results found for: "COLORURINE", "APPEARANCEUR", "LABSPEC", "PHURINE", "GLUCOSEU", "HGBUR", "BILIRUBINUR", "KETONESUR", "PROTEINUR", "UROBILINOGEN", "NITRITE", "LEUKOCYTESUR" Sepsis Labs Recent Labs  Lab 08/21/23 1247 08/22/23 0625  WBC 10.7* 8.8   Microbiology Recent Results (from the past 240 hours)  Blood Cultures x 2 sites     Status: None (Preliminary result)   Collection Time: 08/21/23  2:26 PM   Specimen: BLOOD RIGHT ARM  Result Value Ref Range Status   Specimen Description BLOOD RIGHT ARM  Final   Special Requests   Final    BOTTLES DRAWN AEROBIC AND ANAEROBIC  Blood Culture adequate volume   Culture   Final    NO GROWTH 4 DAYS Performed at [facility], [address] [place], [place] [address]    Report Status PENDING  Incomplete  Blood Cultures x 2 sites     Status: None (Preliminary result)   Collection Time: 08/21/23  2:31 PM   Specimen: BLOOD RIGHT FOREARM  Result Value Ref Range Status   Specimen Description BLOOD RIGHT FOREARM  Final   Special Requests   Final    BOTTLES DRAWN AEROBIC AND ANAEROBIC Blood Culture adequate volume   Culture   Final    NO GROWTH 4 DAYS Performed at [facility], [address], [place], [place] [address]    Report Status PENDING  Incomplete  Aerobic/Anaerobic Culture w Gram Stain (surgical/deep wound)     Status: None (Preliminary result)   Collection Time: 08/22/23  2:12 PM   Specimen: Soft Tissue, Other  Result Value Ref Range Status   Specimen Description TISSUE  Final   Special Requests right foot  Final   Gram Stain   Final    ABUNDANT WBC PRESENT, PREDOMINANTLY PMN FEW GRAM POSITIVE COCCI IN PAIRS Performed at [facility], [address], [place], [place] [address]    Culture   Final    ABUNDANT GROUP B  STREP(S.AGALACTIAE)ISOLATED TESTING AGAINST S. AGALACTIAE NOT ROUTINELY PERFORMED DUE TO PREDICTABILITY OF AMP/PEN/VAN SUSCEPTIBILITY. NO ANAEROBES ISOLATED; CULTURE IN PROGRESS FOR 5 DAYS    Report Status PENDING  Incomplete  Aerobic/Anaerobic Culture w Gram Stain (surgical/deep wound)     Status: None (Preliminary result)   Collection Time: 08/22/23  2:18 PM   Specimen: Soft Tissue, Other  Result Value Ref Range Status   Specimen Description TISSUE  Final   Special Requests bone proximal phalange  Final   Gram Stain   Final    ABUNDANT WBC PRESENT, PREDOMINANTLY PMN RARE GRAM POSITIVE COCCI IN PAIRS Performed at [facility], [address], [place], [place] [address]    Culture   Final    MODERATE GROUP B STREP(S.AGALACTIAE)ISOLATED TESTING AGAINST S. AGALACTIAE NOT ROUTINELY PERFORMED DUE TO PREDICTABILITY OF AMP/PEN/VAN SUSCEPTIBILITY. CULTURE REINCUBATED FOR BETTER GROWTH NO ANAEROBES ISOLATED; CULTURE IN PROGRESS FOR 5 DAYS    Report Status PENDING  Incomplete    FURTHER DISCHARGE INSTRUCTIONS:   Get Medicines reviewed and adjusted: Please take all your medications with you for your next visit with your Primary MD   Laboratory/radiological data: Please request your Primary MD to go over all hospital tests and procedure/radiological results at the follow up, please ask your Primary MD to get all Hospital records sent to his/her office.   In some cases, they will be blood work, cultures and biopsy results pending at the time of your discharge. Please request that your primary care M.D. goes through all the records of your hospital data and follows up on these results.   Also Note the following: If you experience worsening of your admission symptoms, develop shortness of breath, life threatening emergency, suicidal or homicidal thoughts you must seek medical attention immediately by calling 911 or calling your MD immediately  if symptoms less severe.   You must read  complete instructions/literature along with all the possible adverse reactions/side effects for all the Medicines you take and that have been prescribed to you. Take any new Medicines after you have completely understood and accpet all the possible adverse reactions/side effects.    Do  not drive when taking Pain medications or sleeping medications (Benzodaizepines)   Do not take more than prescribed Pain, Sleep and Anxiety Medications. It is not advisable to combine anxiety,sleep and pain medications without talking with your primary care practitioner   Special Instructions: If you have smoked or chewed Tobacco  in the last 2 yrs please stop smoking, stop any regular Alcohol  and or any Recreational drug use.   Wear Seat belts while driving.   Please note: You were cared for by a hospitalist during your hospital stay. Once you are discharged, your primary care physician will handle any further medical issues. Please note that NO REFILLS for any discharge medications will be authorized once you are discharged, as it is imperative that you return to your primary care physician (or establish a relationship with a primary care physician if you do not have one) for your post hospital discharge needs so that they can reassess your need for medications and monitor your lab values  Time coordinating discharge: Over 30 minutes  SIGNED:   Hughie Closs, MD  [facility] 08/25/2023, 11:04 AM *Please note that this is a verbal dictation therefore any spelling or grammatical errors are due to the "Dragon Medical One" system interpretation. If 7PM-7AM, please contact night-coverage [email or web address]

## 2023-08-25 NOTE — Plan of Care (Signed)
  Problem: Education: Goal: Ability to describe self-care measures that may prevent or decrease complications (Diabetes Survival Skills Education) will improve Outcome: Progressing   Problem: Coping: Goal: Ability to adjust to condition or change in health will improve Outcome: Progressing   Problem: Fluid Volume: Goal: Ability to maintain a balanced intake and output will improve Outcome: Progressing   Problem: Health Behavior/Discharge Planning: Goal: Ability to identify and utilize available resources and services will improve Outcome: Progressing Goal: Ability to manage health-related needs will improve Outcome: Progressing   Problem: Metabolic: Goal: Ability to maintain appropriate glucose levels will improve Outcome: Progressing   Problem: Nutritional: Goal: Maintenance of adequate nutrition will improve Outcome: Progressing Goal: Progress toward achieving an optimal weight will improve Outcome: Progressing   Problem: Skin Integrity: Goal: Risk for impaired skin integrity will decrease Outcome: Progressing   Problem: Education: Goal: Knowledge of General Education information will improve Description: Including pain rating scale, medication(s)/side effects and non-pharmacologic comfort measures Outcome: Progressing   Problem: Health Behavior/Discharge Planning: Goal: Ability to manage health-related needs will improve Outcome: Progressing   Problem: Clinical Measurements: Goal: Ability to maintain clinical measurements within normal limits will improve Outcome: Progressing Goal: Will remain free from infection Outcome: Progressing Goal: Diagnostic test results will improve Outcome: Progressing Goal: Respiratory complications will improve Outcome: Progressing Goal: Cardiovascular complication will be avoided Outcome: Progressing   Problem: Activity: Goal: Risk for activity intolerance will decrease Outcome: Progressing   Problem: Nutrition: Goal:  Adequate nutrition will be maintained Outcome: Progressing   Problem: Pain Managment: Goal: General experience of comfort will improve and/or be controlled Outcome: Progressing   Problem: Safety: Goal: Ability to remain free from injury will improve Outcome: Progressing   Problem: Skin Integrity: Goal: Risk for impaired skin integrity will decrease Outcome: Progressing

## 2023-08-26 ENCOUNTER — Telehealth: Payer: Self-pay

## 2023-08-26 LAB — CULTURE, BLOOD (ROUTINE X 2)
Culture: NO GROWTH
Culture: NO GROWTH
Special Requests: ADEQUATE
Special Requests: ADEQUATE

## 2023-08-26 NOTE — Transitions of Care (Post Inpatient/ED Visit) (Signed)
 08/26/2023  Name: Amy Garner MRN: [id number] DOB: 05-26-1955  Today's TOC FU Call Status: Today's TOC FU Call Status:: Successful TOC FU Call Completed TOC FU Call Complete Date: 08/26/23 Patient's Name and Date of Birth confirmed.  Transition Care Management Follow-up Telephone Call Date of Discharge: 08/25/23 Discharge Facility: [facility]) Type of Discharge: Inpatient Admission Primary Inpatient Discharge Diagnosis:: Osteomyelitis (HCC)    Osteomyelitis of toe of right foot (HCC) How have you been since you were released from the hospital?: Better Any questions or concerns?: No  Items Reviewed: Did you receive and understand the discharge instructions provided?: Yes Medications obtained,verified, and reconciled?: Yes (Medications Reviewed) Any new allergies since your discharge?: No Dietary orders reviewed?: Yes Type of Diet Ordered:: Heart Healthy Carb Modified Do you have support at home?: Yes People in Home [RPT]: alone, child(ren), adult, grandchild(ren) Name of Support/Comfort Primary Source: Usually lives alone, staying with her daughter Amy temp.  Medications Reviewed Today: Medications Reviewed Today     Reviewed by Johnnette Barrios, RN (Registered Nurse) on 08/26/23 at 1032  Med List Status: <None>   Medication Order Taking? Sig Documenting Provider Last Dose Status Informant  albuterol (VENTOLIN HFA) 108 (90 Base) MCG/ACT inhaler [id number] Yes Inhale 2 puffs into the lungs every 6 (six) hours as needed for wheezing or shortness of breath. Georganna Skeans, MD Taking Active Self  Alcohol Swabs PADS [id number] Yes USE TO TAKE BLOOD SUGAR 2 TIMES A Damita Dunnings, MD Taking Active Self  amoxicillin-clavulanate (AUGMENTIN) 875-125 MG tablet [id number] Yes Take 1 tablet by mouth 2 (two) times daily for 10 days. Hughie Closs, MD Taking Active   cholecalciferol (VITAMIN D3) 25 MCG (1000 UNIT) tablet [id number] Yes Take 1,000 Units by mouth daily. [provider] Taking Active Self  esomeprazole (NEXIUM) 40 MG capsule [id number] Yes TAKE 1 CAPSULE (40 MG TOTAL) BY MOUTH IN THE MORNING. Georganna Skeans, MD Taking Active Self  FLUoxetine (PROZAC) 20 MG capsule [id number] Yes Take 1 capsule (20 mg total) by mouth daily. Georganna Skeans, MD Taking Active Self  glucose blood (TRUE METRIX BLOOD GLUCOSE TEST) test strip [id number]  Use as instructed Georganna Skeans, MD  Active Self  glucose blood test strip [id number] Yes Use as instructed Georganna Skeans, MD Taking Active Self  ibuprofen (ADVIL) 400 MG tablet [id number] Yes Take 400 mg by mouth every 6 (six) hours as needed for moderate pain (pain score 4-6). [provider] Taking Active   ibuprofen (ADVIL) 800 MG tablet [id number]  Take 1 tablet (800 mg total) by mouth every 6 (six) hours as needed. Candelaria Stagers, DPM  Active Self  insulin glargine, 1 Unit Dial, (TOUJEO SOLOSTAR) 300 UNIT/ML Solostar Pen [id number] Yes INJECT 62 UNITS INTO THE SKIN DAILY. Hoy Register, MD Taking Active Self  Insulin Pen Needle (PEN NEEDLES) 32G X 4 MM MISC [id number] Yes Use to inject insulin once daily. Hoy Register, MD Taking Active Self  losartan (COZAAR) 25 MG tablet [id number] Yes TAKE 1 TABLET EVERY DAY Georganna Skeans, MD Taking Active Self  lovastatin (MEVACOR) 10 MG tablet [id number] Yes TAKE 1 TABLET AT BEDTIME Georganna Skeans, MD Taking Active Self  meclizine (ANTIVERT) 25 MG tablet [id number]  TAKE 1 TABLET (25 MG TOTAL) BY MOUTH 3 (THREE) TIMES DAILY AS NEEDED FOR DIZZINESS. Georganna Skeans, MD  Active Self  metFORMIN (GLUCOPHAGE-XR) 500 MG 24 hr tablet [id number] Yes TAKE 2 TABLETS twice daily with meals. Hoy Register, MD Taking Active Self  Misc. Devices MISC  [id number] Yes TRUE MATRIX METER Georganna Skeans, MD Taking Active Self  oxyCODONE-acetaminophen (PERCOCET) 5-325 MG tablet [id number] Yes Take 1 tablet by mouth every 4 (four) hours as needed for severe pain (pain score 7-10). Hughie Closs, MD  Taking Active   Semaglutide, 2 MG/DOSE, (OZEMPIC, 2 MG/DOSE,) 8 MG/3ML SOPN [id number] No INJECT 2MG  UNDER THE SKIN ONE TIME WEEKLY AS DIRECTED  Patient not taking: No sig reported   Georganna Skeans, MD Not Taking Active Self           Med Note (Tyke Outman L   Fri Aug 26, 2023 10:04 AM) She stopped on her own due to abd pain  triamterene-hydrochlorothiazide (DYAZIDE) 37.5-25 MG capsule [id number] Yes TAKE 1 CAPSULE EVERY Damita Dunnings, MD Taking Active Self  TRUEplus Lancets 33G MISC [id number] Yes USE TO TAKE BLOOD SUGAR 2 TIMES A Damita Dunnings, MD Taking Active Self          Medication reconciliation / review completed based on most recent discharge summary and EHR medication list. Confirmed patient is taking all newly prescribed medications as instructed (any discrepancies are noted in review section)   Patient  is aware of any changes to and / or  any dosage adjustments to medication regimen. Patient denies questions at this time and reports no barriers to medication adherence.   Medication updates reviewed with the patient  START taking: amoxicillin-clavulanate (AUGMENTIN) Take 1 tablet by mouth 2 (two) times daily for 10 days.  CHANGE how you take: ibuprofen (ADVIL) Take 1 tablet (800 mg total) by mouth every 6 (six) hours as needed. What changed: Another medication with the same name was removed. Continue taking this medication, and follow the directions you see here Updated med list -400 mg   STOP taking: doxycycline 100 MG tablet (VIBRA-TABS) nitrofurantoin (macrocrystal-monohydrate) 100 MG capsule (Macrobid)  Home Care and Equipment/Supplies: Were Home Health Services Ordered?: Yes [facility] of [facility] Agency:: [facility] [phone] Has Agency set up a time to come to your home?: No EMR reviewed for Home Health Orders:  (Call placed to [facility] to [place] They have orders and will contact Pt for [facility] Verifed new address and contact number) Any new equipment  or medical supplies ordered?: Yes Name of Medical supply agency?: [employer] [facility] and [facility] ( provided prior to leaving hospital) Were you able to get the equipment/medical supplies?: Yes Do you have any questions related to the use of the equipment/supplies?: No  Functional Questionnaire: Do you need assistance with bathing/showering or dressing?: No ((she is covering foot wih a bag)) Do you need assistance with meal preparation?: No Do you need assistance with eating?: No Do you have difficulty maintaining continence: No Do you need assistance with getting out of bed/getting out of a chair/moving?: No (remains NWB right foot- usnig rolling walker) Do you have difficulty managing or taking your medications?: No  Follow up appointments reviewed: PCP Follow-up appointment confirmed?: No (Pt will call and schedule) MD Provider Line Number:[phone] Given: No Specialist Hospital Follow-up appointment confirmed?: No Reason Specialist Follow-Up Not Confirmed: Patient has Specialist Provider Number and will Call for Appointment Do you need transportation to your follow-up appointment?: No (she can drive , but family will transport while NWB) Do you understand care options if your condition(s) worsen?: Yes-patient verbalized understanding  SDOH Interventions Today    Flowsheet Row Most Recent Value  SDOH Interventions   Food Insecurity Interventions Intervention Not Indicated  Housing Interventions Intervention Not Indicated  Transportation Interventions Intervention Not Indicated,  Patient Resources (Friends/Family), Payor Benefit  [She drives and has family transport]  Utilities Interventions Intervention Not Indicated      Benefits reviewed  Based on current information and Insurance plan -Reviewed benefits accessible to patient, including details about eligibility options for care and  available value based care options  if any areas of needs were identified.  Reviewed patient/  caregiver's  ability to access and / or  ability with navigating the benefits system..Amb Referral made if indicted , refer to orders section of note for details   Reviewed goals for care  Patient / Caregiver was encouraged to make informed decisions about their care, actively participate in managing their health condition, and implement lifestyle changes as needed to promote independence and self-management of health care. There were no reported  barriers to care.   TOC program  Patient is at  risk for readmission and / or has history of  high utilization  Discussed VBCI  TOC program and weekly calls to patient to assess condition/status, medication management  and provide support/education as indicated . Patient  and / or Caregive voiced understanding and declined enrollment in the 30-day TOC Program at this time . She stated she is doing well at home he is staying with  her daughter while she is NWB. She feels she has strong support at home and did not need additional follow-up.Call placed to [facility] and verified Orders and SOC to be scheduled    Follow-up Plan 1.If not already completed- Please call scheduled a post hospital Follow up appointment  with your  PCP within in 1-2 weeks of your hospital discharge date [address] [place] [address] [phone]  -when scheduling please ask your Primary MD to get all Hospital records sent to his/her office. She declined offer to schedule. She needs to schedule based on family transport assistance  Take all your medications and your discharge paperwork with you for your next visit with your Primary MD-   Be sure to request your Primary MD to go over all hospital tests and procedure/radiological results at the follow up appointment ,   2. Follow-up with podiatry in 7 to 10 days- she will call and schedule 3. Follow-up with Endocrinology regarding Ozempic -She stopped this medication She will call and schedule after she  is cleared from  Orthopedics  4. Accept services with [facility] and attend visits until goals are met [phone]    Patient was encouraged to Contact PCP with any questions or concerns regarding ongoing medical care, any difficulty obtaining or picking up prescriptions, any changes or worsening in condition including signs / symptoms not relieved  with interventions Patient had no additional questions or concerns at this time.   The patient has been provided with contact information for the care management team and has been advised to call with any health-related questions or concerns. Follow up with their  PCP, Specialists or  additional Healthcare Providers as scheduled,  sooner if concerns arise    Susa Loffler , BSN, RN [facility]   VBCI-Population Health RN Care Manager Direct Dial [phone]  Fax: [phone] Website: [email or web address]

## 2023-08-26 NOTE — Telephone Encounter (Signed)
 Copied from CRM [id number]. Topic: Appointments - Scheduling Inquiry for Clinic >> Aug 26, 2023  1:49 PM Fuller Mandril wrote: Reason for CRM: Patient called to make appt with [facility]. States she just got out of hospital and needs appt with him about her medication. Would like a call back or MyChart message to set appt. Thank You

## 2023-08-27 LAB — AEROBIC/ANAEROBIC CULTURE W GRAM STAIN (SURGICAL/DEEP WOUND)

## 2023-08-29 DIAGNOSIS — I70202 Unspecified atherosclerosis of native arteries of extremities, left leg: Secondary | ICD-10-CM | POA: Diagnosis not present

## 2023-08-29 DIAGNOSIS — M869 Osteomyelitis, unspecified: Secondary | ICD-10-CM | POA: Diagnosis not present

## 2023-08-29 DIAGNOSIS — Z89431 Acquired absence of right foot: Secondary | ICD-10-CM | POA: Diagnosis not present

## 2023-08-29 DIAGNOSIS — J45909 Unspecified asthma, uncomplicated: Secondary | ICD-10-CM | POA: Diagnosis not present

## 2023-08-29 DIAGNOSIS — E1151 Type 2 diabetes mellitus with diabetic peripheral angiopathy without gangrene: Secondary | ICD-10-CM | POA: Diagnosis not present

## 2023-08-29 DIAGNOSIS — E785 Hyperlipidemia, unspecified: Secondary | ICD-10-CM | POA: Diagnosis not present

## 2023-08-29 DIAGNOSIS — E1169 Type 2 diabetes mellitus with other specified complication: Secondary | ICD-10-CM | POA: Diagnosis not present

## 2023-08-29 DIAGNOSIS — Z4781 Encounter for orthopedic aftercare following surgical amputation: Secondary | ICD-10-CM | POA: Diagnosis not present

## 2023-08-29 DIAGNOSIS — I10 Essential (primary) hypertension: Secondary | ICD-10-CM | POA: Diagnosis not present

## 2023-08-30 NOTE — Telephone Encounter (Signed)
 Noted.

## 2023-09-01 ENCOUNTER — Ambulatory Visit (INDEPENDENT_AMBULATORY_CARE_PROVIDER_SITE_OTHER): Admitting: Podiatry

## 2023-09-01 DIAGNOSIS — Z89421 Acquired absence of other right toe(s): Secondary | ICD-10-CM

## 2023-09-01 NOTE — Progress Notes (Signed)
 Subjective:  Patient ID: Amy Garner, female    DOB: 02-18-1956,  MRN: [id number]  Chief Complaint  Patient presents with   Routine Post Op    DOS: 08/24/2023  Procedure: Repeat irrigation and excisional debridement with prep for graft, right foot      DOS: 08/24/2023 Procedure: Right partial first ray amputation  68 y.o. female returns for post-op check.  Patient states she is doing okay denies any other acute complaints.  Bandage bandages clean dry and intact.  Partial weightbearing to the heel  Review of Systems: Negative except as noted in the HPI. Denies N/V/F/Ch.  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    Peptic ulcer    Pneumonia    Type 2 diabetes mellitus (HCC)     Current Outpatient Medications:    albuterol (VENTOLIN HFA) 108 (90 Base) MCG/ACT inhaler, Inhale 2 puffs into the lungs every 6 (six) hours as needed for wheezing or shortness of breath., Disp: 18 g, Rfl: 5   Alcohol Swabs PADS, USE TO TAKE BLOOD SUGAR 2 TIMES A DAY, Disp: 120 each, Rfl: 2   amoxicillin-clavulanate (AUGMENTIN) 875-125 MG tablet, Take 1 tablet by mouth 2 (two) times daily for 10 days., Disp: 20 tablet, Rfl: 0   cholecalciferol (VITAMIN D3) 25 MCG (1000 UNIT) tablet, Take 1,000 Units by mouth daily., Disp: , Rfl:    esomeprazole (NEXIUM) 40 MG capsule, TAKE 1 CAPSULE (40 MG TOTAL) BY MOUTH IN THE MORNING., Disp: 90 capsule, Rfl: 3   FLUoxetine (PROZAC) 20 MG capsule, Take 1 capsule (20 mg total) by mouth daily., Disp: 90 capsule, Rfl: 1   glucose blood (TRUE METRIX BLOOD GLUCOSE TEST) test strip, Use as instructed, Disp: 100 each, Rfl: 12   glucose blood test strip, Use as  instructed, Disp: 100 each, Rfl: 5   ibuprofen (ADVIL) 400 MG tablet, Take 400 mg by mouth every 6 (six) hours as needed for moderate pain (pain score 4-6)., Disp: , Rfl:    ibuprofen (ADVIL) 800 MG tablet, Take 1 tablet (800 mg total) by mouth every 6 (six) hours as needed., Disp: 60 tablet, Rfl: 1   insulin glargine, 1 Unit Dial, (TOUJEO SOLOSTAR) 300 UNIT/ML Solostar Pen, INJECT 62 UNITS INTO THE SKIN DAILY., Disp: 4.5 mL, Rfl: 3   Insulin Pen Needle (PEN NEEDLES) 32G X 4 MM MISC, Use to inject insulin once daily., Disp: 100 each, Rfl: 3   losartan (COZAAR) 25 MG tablet, TAKE 1 TABLET EVERY DAY, Disp: 90 tablet, Rfl: 3   lovastatin (MEVACOR) 10 MG tablet, TAKE 1 TABLET AT BEDTIME, Disp: 90 tablet, Rfl: 3   meclizine (ANTIVERT) 25 MG tablet, TAKE 1 TABLET (25 MG TOTAL) BY MOUTH 3 (THREE) TIMES DAILY AS NEEDED FOR DIZZINESS., Disp: 30 tablet, Rfl: 0   metFORMIN (GLUCOPHAGE-XR) 500 MG 24 hr tablet, TAKE 2 TABLETS twice daily with meals., Disp: 360 tablet, Rfl: 2   Misc. Devices MISC, TRUE MATRIX METER, Disp: 1 each, Rfl: 0   oxyCODONE-acetaminophen (PERCOCET) 5-325 MG tablet, Take 1 tablet by mouth every 4 (four) hours as needed for severe pain (pain score 7-10)., Disp:  30 tablet, Rfl: 0   Semaglutide, 2 MG/DOSE, (OZEMPIC, 2 MG/DOSE,) 8 MG/3ML SOPN, INJECT 2MG  UNDER THE SKIN ONE TIME WEEKLY AS DIRECTED (Patient not taking: No sig reported), Disp: 9 mL, Rfl: 1   triamterene-hydrochlorothiazide (DYAZIDE) 37.5-25 MG capsule, TAKE 1 CAPSULE EVERY DAY, Disp: 90 capsule, Rfl: 3   TRUEplus Lancets 33G MISC, USE TO TAKE BLOOD SUGAR 2 TIMES A DAY, Disp: 100 each, Rfl: 2  Social History   Tobacco Use  Smoking Status Former   Current packs/day: 0.00   Average packs/day: 1.1 packs/day for 20.0 years (22.5 ttl pk-yrs)   Types: Cigarettes   Start date: 2002   Quit date: 2017   Years since quitting: 8.2  Smokeless Tobacco Never    Allergies  Allergen Reactions   Atenolol Anaphylaxis   Codeine Other  (See Comments)    Pt says she gets "crazy"   Objective:  There were no vitals filed for this visit. There is no height or weight on file to calculate BMI. Constitutional Well developed. Well nourished.  Vascular Foot warm and well perfused. Capillary refill normal to all digits.   Neurologic Normal speech. Oriented to person, place, and time. Epicritic sensation to light touch grossly present bilaterally.  Dermatologic Skin healing well without signs of infection. Skin edges well coapted without signs of infection.  Orthopedic: Tenderness to palpation noted about the surgical site.   Radiographs: None Assessment:  No diagnosis found. Plan:  Patient was evaluated and treated and all questions answered.  S/p foot surgery right -Progressing as expected post-operatively. -XR: See above -WB Status: Partial bearing to the heel in surgical shoe -Sutures: Intact.  No clinical signs of dehiscence noted no complication noted. -Medications: None -Foot redressed.  No follow-ups on file.

## 2023-09-05 ENCOUNTER — Encounter: Payer: Self-pay | Admitting: Family Medicine

## 2023-09-05 ENCOUNTER — Ambulatory Visit: Admitting: Family Medicine

## 2023-09-05 VITALS — BP 159/81 | HR 86 | Temp 98.9°F | Resp 18 | Ht 66.0 in | Wt 236.0 lb

## 2023-09-05 DIAGNOSIS — Z09 Encounter for follow-up examination after completed treatment for conditions other than malignant neoplasm: Secondary | ICD-10-CM

## 2023-09-05 DIAGNOSIS — Z794 Long term (current) use of insulin: Secondary | ICD-10-CM | POA: Diagnosis not present

## 2023-09-05 DIAGNOSIS — E1169 Type 2 diabetes mellitus with other specified complication: Secondary | ICD-10-CM

## 2023-09-05 DIAGNOSIS — S98111A Complete traumatic amputation of right great toe, initial encounter: Secondary | ICD-10-CM

## 2023-09-05 DIAGNOSIS — Z89431 Acquired absence of right foot: Secondary | ICD-10-CM | POA: Diagnosis not present

## 2023-09-05 DIAGNOSIS — J45909 Unspecified asthma, uncomplicated: Secondary | ICD-10-CM | POA: Diagnosis not present

## 2023-09-05 DIAGNOSIS — I10 Essential (primary) hypertension: Secondary | ICD-10-CM | POA: Diagnosis not present

## 2023-09-05 DIAGNOSIS — E1151 Type 2 diabetes mellitus with diabetic peripheral angiopathy without gangrene: Secondary | ICD-10-CM | POA: Diagnosis not present

## 2023-09-05 DIAGNOSIS — Z4781 Encounter for orthopedic aftercare following surgical amputation: Secondary | ICD-10-CM | POA: Diagnosis not present

## 2023-09-05 DIAGNOSIS — E785 Hyperlipidemia, unspecified: Secondary | ICD-10-CM

## 2023-09-05 DIAGNOSIS — M869 Osteomyelitis, unspecified: Secondary | ICD-10-CM | POA: Diagnosis not present

## 2023-09-05 DIAGNOSIS — E119 Type 2 diabetes mellitus without complications: Secondary | ICD-10-CM

## 2023-09-05 DIAGNOSIS — I70202 Unspecified atherosclerosis of native arteries of extremities, left leg: Secondary | ICD-10-CM | POA: Diagnosis not present

## 2023-09-05 MED ORDER — TOUJEO SOLOSTAR 300 UNIT/ML ~~LOC~~ SOPN: 62.0000 [IU] | PEN_INJECTOR | Freq: Every day | SUBCUTANEOUS | 3 refills | Status: DC

## 2023-09-05 NOTE — Progress Notes (Signed)
 Established Patient Office Visit  Subjective    Patient ID: Amy Garner, female    DOB: 1956/04/07  Age: 68 y.o. MRN: [id number]  CC:  Chief Complaint  Patient presents with   Hospitalization Follow-up    HPI [name] presents for routine hospital discharge where she was seen for amputation of right great toe. She reports that she is improving daily.   Outpatient Encounter Medications as of 09/05/2023  Medication Sig   albuterol  (VENTOLIN  HFA) 108 (90 Base) MCG/ACT inhaler Inhale 2 puffs into the lungs every 6 (six) hours as needed for wheezing or shortness of breath.   Alcohol  Swabs  PADS USE TO TAKE BLOOD SUGAR 2 TIMES A DAY   cholecalciferol (VITAMIN D3) 25 MCG (1000 UNIT) tablet Take 1,000 Units by mouth daily.   esomeprazole  (NEXIUM ) 40 MG capsule TAKE 1 CAPSULE (40 MG TOTAL) BY MOUTH IN THE MORNING.   FLUoxetine  (PROZAC ) 20 MG capsule Take 1 capsule (20 mg total) by mouth daily.   glucose blood test strip Use as instructed   ibuprofen  (ADVIL ) 400 MG tablet Take 400 mg by mouth every 6 (six) hours as needed for moderate pain (pain score 4-6).   Insulin  Pen Needle (PEN NEEDLES) 32G X 4 MM MISC Use to inject insulin  once daily.   losartan  (COZAAR ) 25 MG tablet TAKE 1 TABLET EVERY DAY   lovastatin  (MEVACOR ) 10 MG tablet TAKE 1 TABLET AT BEDTIME   meclizine  (ANTIVERT ) 25 MG tablet TAKE 1 TABLET (25 MG TOTAL) BY MOUTH 3 (THREE) TIMES DAILY AS NEEDED FOR DIZZINESS.   metFORMIN  (GLUCOPHAGE -XR) 500 MG 24 hr tablet TAKE 2 TABLETS twice daily with meals.   Misc. Devices MISC TRUE MATRIX METER   oxyCODONE -acetaminophen  (PERCOCET) 5-325 MG tablet Take 1 tablet by mouth every 4 (four) hours as needed for severe pain (pain score 7-10).   triamterene -hydrochlorothiazide  (DYAZIDE ) 37.5-25 MG capsule TAKE 1 CAPSULE EVERY DAY   TRUEplus Lancets 33G MISC USE TO TAKE BLOOD SUGAR 2 TIMES A DAY   [DISCONTINUED] insulin  glargine, 1 Unit Dial , (TOUJEO  SOLOSTAR) 300 UNIT/ML Solostar Pen  INJECT 62 UNITS INTO THE SKIN DAILY.   glucose blood (TRUE METRIX BLOOD GLUCOSE TEST) test strip Use as instructed   ibuprofen  (ADVIL ) 800 MG tablet Take 1 tablet (800 mg total) by mouth every 6 (six) hours as needed.   insulin  glargine, 1 Unit Dial , (TOUJEO  SOLOSTAR) 300 UNIT/ML Solostar Pen Inject 62 Units into the skin daily.   [DISCONTINUED] Semaglutide , 2 MG/DOSE, (OZEMPIC , 2 MG/DOSE,) 8 MG/3ML SOPN INJECT 2MG  UNDER THE SKIN ONE TIME WEEKLY AS DIRECTED (Patient not taking: No sig reported)   No facility-administered encounter medications on file as of 09/05/2023.    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    Peptic ulcer    Pneumonia    Type 2 diabetes mellitus (HCC)     Past Surgical History:  Procedure Laterality Date   AMPUTATION Right 08/22/2023   Procedure: AMPUTATION, FOOT, RAY;  Surgeon: Evertt Hoe, DPM;  Location: [facility];  Service: Orthopedics/Podiatry;  Laterality: Right;   APPENDECTOMY  1990   APPLICATION OF  WOUND VAC Right 08/22/2023   Procedure: APPLICATION, WOUND VAC;  Surgeon: Evertt Hoe, DPM;  Location: [facility];  Service: Orthopedics/Podiatry;  Laterality: Right;   BREAST BIOPSY     BUNIONECTOMY Right    CAPSULOTOMY METATARSOPHALANGEAL Right 02/07/2023   Procedure: CAPSULOTOMY METATARSOPHALANGEAL SECOND AND THIRD;  Surgeon: Velma Ghazi, DPM;  Location: [facility];  Service: Podiatry;  Laterality: Right;   CERVICAL DISC SURGERY     CHOLECYSTECTOMY     COLONOSCOPY     DIAGNOSTIC LAPAROSCOPY  1980   endometriosis   HALLUX FUSION Right 02/07/2023   Procedure: HALLUX FUSION METATARSAL PHALANGEAL JOINT;  Surgeon: Velma Ghazi, DPM;   Location: [facility] OR[facility];  Service: Podiatry;  Laterality: Right;  POPLITEAL BLOCK   HAMMER TOE SURGERY Right 02/07/2023   Procedure: HAMMER TOE CORRECTION SECOND AND THIRD;  Surgeon: Velma Ghazi, DPM;  Location: [facility];  Service: Podiatry;  Laterality: Right;   HERNIA REPAIR  Inguinal right & left repair.   INGUINAL HERNIA REPAIR Left 05/02/2020   Procedure: OPEN LEFT INGUINAL HERNIA REPAIR WITH MESH, TAP BLOCK;  Surgeon: Adalberto Acton, MD;  Location: [facility];  Service: General;  Laterality: Left;   IRRIGATION AND DEBRIDEMENT FOOT Right 08/24/2023   Procedure: IRRIGATION AND DEBRIDEMENT FOOT;  Surgeon: Evertt Hoe, DPM;  Location: [facility];  Service: Orthopedics/Podiatry;  Laterality: Right;  Irrigation and Debridement Right Foot Wound with application of skin graft, Antibiotic Beads, and Delayed Closure of Wound   TUBAL LIGATION  1990s    Family History  Problem Relation Age of Onset   Heart attack Mother    Asthma Mother    Heart disease Father    Hypertension Father    Stomach cancer Paternal Aunt    Colon cancer Paternal Aunt    Varicose Veins Maternal Grandmother    Diabetes Paternal Grandmother    ADD / ADHD Daughter    ADD / ADHD Son     Social History   Socioeconomic History   Marital status: Widowed    Spouse name: Not on file   Number of children: Not on file   Years of education: Not on file   Highest education level: Some college, no degree  Occupational History   Not on file  Tobacco Use   Smoking status: Former    Current packs/day: 0.00    Average packs/day: 1.1 packs/day for 20.0 years (22.5 ttl pk-yrs)    Types: Cigarettes    Start date: 2002    Quit date: 2017    Years since quitting: 8.3   Smokeless tobacco: Never  Vaping Use   Vaping status: Never Used  Substance and Sexual Activity   Alcohol  use: Never   Drug use: Never   Sexual activity: Yes    Birth control/protection: Surgical  Other Topics Concern   Not on file  Social History  Narrative   Lives alone   Social Drivers of Health   Financial Resource Strain: Low Risk  (07/17/2023)   Overall Financial Resource Strain (CARDIA)    Difficulty of Paying Living Expenses: Not very hard  Food Insecurity: No Food Insecurity (08/26/2023)   Hunger Vital Sign    Worried About Running Out of Food in the Last Year: Never true    Ran Out of Food in the Last Year: Never true  Recent Concern: Food Insecurity - Food Insecurity Present (07/17/2023)   Hunger Vital Sign    Worried About Running Out of Food in the Last Year: Sometimes  true    Ran Out of Food in the Last Year: Never true  Transportation Needs: No Transportation Needs (08/26/2023)   PRAPARE - Administrator, Civil Service (Medical): No    Lack of Transportation (Non-Medical): No  Physical Activity: Insufficiently Active (07/17/2023)   Exercise Vital Sign    Days of Exercise per Week: 1 day    Minutes of Exercise per Session: 20 min  Stress: No Stress Concern Present (07/17/2023)   [employer] of Occupational Health - Occupational Stress Questionnaire    Feeling of Stress : Not at all  Social Connections: Moderately Isolated (08/21/2023)   Social Connection and Isolation Panel [NHANES]    Frequency of Communication with Friends and Family: More than three times a week    Frequency of Social Gatherings with Friends and Family: Once a week    Attends Religious Services: Never    Database administrator or Organizations: No    Attends Banker Meetings: 1 to 4 times per year    Marital Status: Widowed  Intimate Partner Violence: Not At Risk (08/26/2023)   Humiliation, Afraid, Rape, and Kick questionnaire    Fear of Current or Ex-Partner: No    Emotionally Abused: No    Physically Abused: No    Sexually Abused: No    Review of Systems  All other systems reviewed and are negative.       Objective    BP (!) 159/81   Pulse 86   Temp 98.9 F (37.2 C) (Oral)   Resp 18   Ht 5\' 6"  (1.676  m)   Wt 236 lb (107 kg)   SpO2 95%   BMI 38.09 kg/m   Physical Exam Vitals and nursing note reviewed.  Constitutional:      General: She is not in acute distress. Cardiovascular:     Rate and Rhythm: Normal rate and regular rhythm.  Pulmonary:     Effort: Pulmonary effort is normal.     Breath sounds: Normal breath sounds.  Abdominal:     Palpations: Abdomen is soft.     Tenderness: There is no abdominal tenderness.  Neurological:     General: No focal deficit present.     Mental Status: She is alert and oriented to person, place, and time.         Assessment & Plan:   Hospital discharge follow-up  Amputation of right great toe (HCC)  Type 2 diabetes mellitus with other specified complication, without long-term current use of insulin  (HCC)  Essential hypertension  Hyperlipidemia, unspecified hyperlipidemia type  Encounter for long-term (current) use of insulin  (HCC)  Diabetes mellitus treated with oral medication (HCC)  Other orders -     Toujeo  SoloStar; Inject 62 Units into the skin daily.  Dispense: 4.5 mL; Refill: 3     Return in about 3 months (around 12/05/2023).   Arlo Lama, MD

## 2023-09-06 ENCOUNTER — Telehealth: Payer: Self-pay

## 2023-09-06 DIAGNOSIS — J45909 Unspecified asthma, uncomplicated: Secondary | ICD-10-CM | POA: Diagnosis not present

## 2023-09-06 DIAGNOSIS — E1151 Type 2 diabetes mellitus with diabetic peripheral angiopathy without gangrene: Secondary | ICD-10-CM | POA: Diagnosis not present

## 2023-09-06 DIAGNOSIS — M869 Osteomyelitis, unspecified: Secondary | ICD-10-CM | POA: Diagnosis not present

## 2023-09-06 DIAGNOSIS — I70202 Unspecified atherosclerosis of native arteries of extremities, left leg: Secondary | ICD-10-CM | POA: Diagnosis not present

## 2023-09-06 DIAGNOSIS — E1169 Type 2 diabetes mellitus with other specified complication: Secondary | ICD-10-CM | POA: Diagnosis not present

## 2023-09-06 DIAGNOSIS — E785 Hyperlipidemia, unspecified: Secondary | ICD-10-CM | POA: Diagnosis not present

## 2023-09-06 DIAGNOSIS — Z4781 Encounter for orthopedic aftercare following surgical amputation: Secondary | ICD-10-CM | POA: Diagnosis not present

## 2023-09-06 DIAGNOSIS — Z89431 Acquired absence of right foot: Secondary | ICD-10-CM | POA: Diagnosis not present

## 2023-09-06 DIAGNOSIS — I10 Essential (primary) hypertension: Secondary | ICD-10-CM | POA: Diagnosis not present

## 2023-09-06 NOTE — Telephone Encounter (Signed)
 Copied from CRM [id number]. Topic: Clinical - Medical Advice >> Sep 06, 2023 12:40 PM Rosaria Common wrote: Reason for CRM: Home Health Aide giving update on pt [name] with [facility]. Pt reports when exerting she gets gitters in torso, Last few minutes. Up and moving around. Partial toe amputation. Oxygen 95-96.Aaron Aasdecrease to 91 when up and moving around. Heart rate is 70-80.Aaron Aaswalking around 120-130. Fasting blood sugar today 195. Callback number is [phone]. Voicemail confidential.

## 2023-09-06 NOTE — Telephone Encounter (Signed)
 April, nurse with [facility] called requesting verbal orders to see patient 2 times this week then weekly for 4 weeks. She stated that patient states she is suppose to be painting surgical incision with betadine 3 times a week and wrapping with kerlex and ace bandage, she needs order for that as well. She would also like to know if it needs to be cleaned and if so then with what?

## 2023-09-07 DIAGNOSIS — M869 Osteomyelitis, unspecified: Secondary | ICD-10-CM | POA: Diagnosis not present

## 2023-09-07 DIAGNOSIS — J45909 Unspecified asthma, uncomplicated: Secondary | ICD-10-CM | POA: Diagnosis not present

## 2023-09-07 DIAGNOSIS — I70202 Unspecified atherosclerosis of native arteries of extremities, left leg: Secondary | ICD-10-CM | POA: Diagnosis not present

## 2023-09-07 DIAGNOSIS — E1169 Type 2 diabetes mellitus with other specified complication: Secondary | ICD-10-CM | POA: Diagnosis not present

## 2023-09-07 DIAGNOSIS — E785 Hyperlipidemia, unspecified: Secondary | ICD-10-CM | POA: Diagnosis not present

## 2023-09-07 DIAGNOSIS — Z4781 Encounter for orthopedic aftercare following surgical amputation: Secondary | ICD-10-CM | POA: Diagnosis not present

## 2023-09-07 DIAGNOSIS — E1151 Type 2 diabetes mellitus with diabetic peripheral angiopathy without gangrene: Secondary | ICD-10-CM | POA: Diagnosis not present

## 2023-09-07 DIAGNOSIS — I10 Essential (primary) hypertension: Secondary | ICD-10-CM | POA: Diagnosis not present

## 2023-09-07 DIAGNOSIS — Z89431 Acquired absence of right foot: Secondary | ICD-10-CM | POA: Diagnosis not present

## 2023-09-08 ENCOUNTER — Encounter: Payer: Self-pay | Admitting: Family Medicine

## 2023-09-08 ENCOUNTER — Ambulatory Visit: Payer: Self-pay

## 2023-09-08 NOTE — Telephone Encounter (Signed)
 Copied from CRM [id number]. Topic: Clinical - Medication Question >> Sep 08, 2023 10:01 AM Tiffany B wrote: Reason for CRM: Patient was prescribed Augmentin  while being in the hospital which caused her to have a yeast infection, patient would like PCP to prescribe a medication stating she's experiencing vaginal itchiness.    [facility] [address] ([address]), [place] - [address]  Phone: [phone] Fax: [phone]

## 2023-09-08 NOTE — Telephone Encounter (Signed)
 Chief Complaint: Vaginal Itching Symptoms: mild discharge Frequency: Worsening since yesterday Pertinent Negatives: Patient denies fever, pain Disposition: [] ED /[] Urgent Care (no appt availability in office) / [x] Appointment(In office/virtual)/ []  [facility]/ [] Home Care/ [] Refused Recommended Disposition /[] [facility]/ []  Follow-up with PCP Additional Notes: Pt reports she completed abx course Sunday following hospital stay. Pt notes she began with moderate vaginal itching  and mild discharge yesterday. Pt advised she was seen in office Monday for hospital follow up and is requesting script sent to pharmacy. Pharmacy verified.   Reason for Disposition  MODERATE-SEVERE itching (i.e., interferes with school, work, or sleep)  Answer Assessment - Initial Assessment Questions 1. SYMPTOM: "What's the main symptom you're concerned about?" (e.g., pain, itching, dryness)     Vaginal itching 2. LOCATION: "Where is the itching located?" (e.g., inside/outside, left/right)     Around outside 3. ONSET: "When did the symptoms  start?"     Yesterday 4. PAIN: "Is there any pain?" If Yes, ask: "How bad is it?" (Scale: 1-10; mild, moderate, severe)   -  MILD (1-3): Doesn't interfere with normal activities.    -  MODERATE (4-7): Interferes with normal activities (e.g., work or school) or awakens from sleep.     -  SEVERE (8-10): Excruciating pain, unable to do any normal activities.     None 5. ITCHING: "Is there any itching?" If Yes, ask: "How bad is it?" (Scale: 1-10; mild, moderate, severe)     Moderate 6. CAUSE: "What do you think is causing the discharge?" "Have you had the same problem before? What happened then?"     Pt has been on abx course, completed Sunday 7. OTHER SYMPTOMS: "Do you have any other symptoms?" (e.g., fever, itching, vaginal bleeding, pain with urination, injury to genital area, vaginal foreign body)     Mild discharge  Protocols used: Vaginal  Symptoms-A-AH

## 2023-09-08 NOTE — Telephone Encounter (Signed)
 3 attempts made to contact pt, no contact made. LVM.

## 2023-09-09 ENCOUNTER — Other Ambulatory Visit: Payer: Self-pay | Admitting: Family Medicine

## 2023-09-09 MED ORDER — FLUCONAZOLE 150 MG PO TABS: 150.0000 mg | ORAL_TABLET | Freq: Once | ORAL | 0 refills | Status: AC

## 2023-09-09 NOTE — Telephone Encounter (Signed)
Called patient and she is aware of medication 

## 2023-09-12 DIAGNOSIS — J45909 Unspecified asthma, uncomplicated: Secondary | ICD-10-CM | POA: Diagnosis not present

## 2023-09-12 DIAGNOSIS — E785 Hyperlipidemia, unspecified: Secondary | ICD-10-CM | POA: Diagnosis not present

## 2023-09-12 DIAGNOSIS — I10 Essential (primary) hypertension: Secondary | ICD-10-CM | POA: Diagnosis not present

## 2023-09-12 DIAGNOSIS — M869 Osteomyelitis, unspecified: Secondary | ICD-10-CM | POA: Diagnosis not present

## 2023-09-12 DIAGNOSIS — Z89431 Acquired absence of right foot: Secondary | ICD-10-CM | POA: Diagnosis not present

## 2023-09-12 DIAGNOSIS — Z4781 Encounter for orthopedic aftercare following surgical amputation: Secondary | ICD-10-CM | POA: Diagnosis not present

## 2023-09-12 DIAGNOSIS — E1169 Type 2 diabetes mellitus with other specified complication: Secondary | ICD-10-CM | POA: Diagnosis not present

## 2023-09-12 DIAGNOSIS — I70202 Unspecified atherosclerosis of native arteries of extremities, left leg: Secondary | ICD-10-CM | POA: Diagnosis not present

## 2023-09-12 DIAGNOSIS — E1151 Type 2 diabetes mellitus with diabetic peripheral angiopathy without gangrene: Secondary | ICD-10-CM | POA: Diagnosis not present

## 2023-09-12 NOTE — Telephone Encounter (Signed)
 Amy Garner, [place], [phone]        Mr Adriane Albe called in regards to this patient and he said pt has had a 13 lb weight loss in the past 3 weeks. She has 2 soft bm daily, she reports that she eats 3 meals of healthy food daily. The only reason she can come up with for weight loss is due to eating healthy.    Just and FYI.

## 2023-09-14 DIAGNOSIS — Z89431 Acquired absence of right foot: Secondary | ICD-10-CM | POA: Diagnosis not present

## 2023-09-14 DIAGNOSIS — Z4781 Encounter for orthopedic aftercare following surgical amputation: Secondary | ICD-10-CM | POA: Diagnosis not present

## 2023-09-14 DIAGNOSIS — J45909 Unspecified asthma, uncomplicated: Secondary | ICD-10-CM | POA: Diagnosis not present

## 2023-09-14 DIAGNOSIS — E1169 Type 2 diabetes mellitus with other specified complication: Secondary | ICD-10-CM | POA: Diagnosis not present

## 2023-09-14 DIAGNOSIS — I10 Essential (primary) hypertension: Secondary | ICD-10-CM | POA: Diagnosis not present

## 2023-09-14 DIAGNOSIS — I70202 Unspecified atherosclerosis of native arteries of extremities, left leg: Secondary | ICD-10-CM | POA: Diagnosis not present

## 2023-09-14 DIAGNOSIS — E785 Hyperlipidemia, unspecified: Secondary | ICD-10-CM | POA: Diagnosis not present

## 2023-09-14 DIAGNOSIS — E1151 Type 2 diabetes mellitus with diabetic peripheral angiopathy without gangrene: Secondary | ICD-10-CM | POA: Diagnosis not present

## 2023-09-14 DIAGNOSIS — M869 Osteomyelitis, unspecified: Secondary | ICD-10-CM | POA: Diagnosis not present

## 2023-09-16 ENCOUNTER — Encounter: Payer: Self-pay | Admitting: Podiatry

## 2023-09-16 ENCOUNTER — Ambulatory Visit: Admitting: Podiatry

## 2023-09-16 DIAGNOSIS — Z89421 Acquired absence of other right toe(s): Secondary | ICD-10-CM

## 2023-09-16 NOTE — Progress Notes (Unsigned)
 Subjective:  Patient ID: Amy Garner, female    DOB: 10/02/1955,  MRN: [id number]  No chief complaint on file.   DOS: 08/24/2023 Procedure: Right partial first ray amputation  68 y.o. female returns for post-op check.  Patient states she is doing okay denies any other acute complaints.  Bandage bandages clean dry and intact.  Partial weightbearing to the heel  Review of Systems: Negative except as noted in the HPI. Denies N/V/F/Ch.  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    Peptic ulcer    Pneumonia    Type 2 diabetes mellitus (HCC)     Current Outpatient Medications:    albuterol  (VENTOLIN  HFA) 108 (90 Base) MCG/ACT inhaler, Inhale 2 puffs into the lungs every 6 (six) hours as needed for wheezing or shortness of breath., Disp: 18 g, Rfl: 5   Alcohol  Swabs  PADS, USE TO TAKE BLOOD SUGAR 2 TIMES A DAY, Disp: 120 each, Rfl: 2   cholecalciferol (VITAMIN D3) 25 MCG (1000 UNIT) tablet, Take 1,000 Units by mouth daily., Disp: , Rfl:    esomeprazole  (NEXIUM ) 40 MG capsule, TAKE 1 CAPSULE (40 MG TOTAL) BY MOUTH IN THE MORNING., Disp: 90 capsule, Rfl: 3   FLUoxetine  (PROZAC ) 20 MG capsule, Take 1 capsule (20 mg total) by mouth daily., Disp: 90 capsule, Rfl: 1   glucose blood test strip, Use as instructed, Disp: 100 each, Rfl: 5   ibuprofen  (ADVIL ) 400 MG tablet, Take 400 mg by mouth every 6 (six) hours as needed for moderate pain (pain score 4-6)., Disp: , Rfl:    insulin  glargine, 1 Unit Dial, (TOUJEO  SOLOSTAR) 300 UNIT/ML Solostar Pen, Inject 62 Units into the skin daily., Disp: 4.5 mL, Rfl: 3   Insulin  Pen Needle (PEN NEEDLES) 32G X 4 MM MISC, Use to inject insulin  once  daily., Disp: 100 each, Rfl: 3   losartan  (COZAAR ) 25 MG tablet, TAKE 1 TABLET EVERY DAY, Disp: 90 tablet, Rfl: 3   lovastatin  (MEVACOR ) 10 MG tablet, TAKE 1 TABLET AT BEDTIME, Disp: 90 tablet, Rfl: 3   meclizine  (ANTIVERT ) 25 MG tablet, TAKE 1 TABLET (25 MG TOTAL) BY MOUTH 3 (THREE) TIMES DAILY AS NEEDED FOR DIZZINESS., Disp: 30 tablet, Rfl: 0   metFORMIN  (GLUCOPHAGE -XR) 500 MG 24 hr tablet, TAKE 2 TABLETS twice daily with meals., Disp: 360 tablet, Rfl: 2   Misc. Devices MISC, TRUE MATRIX METER, Disp: 1 each, Rfl: 0   oxyCODONE -acetaminophen  (PERCOCET) 5-325 MG tablet, Take 1 tablet by mouth every 4 (four) hours as needed for severe pain (pain score 7-10)., Disp: 30 tablet, Rfl: 0   triamterene -hydrochlorothiazide  (DYAZIDE ) 37.5-25 MG capsule, TAKE 1 CAPSULE EVERY DAY, Disp: 90 capsule, Rfl: 3   TRUEplus Lancets 33G MISC, USE TO TAKE BLOOD SUGAR 2 TIMES A DAY, Disp: 100 each, Rfl: 2  Social History   Tobacco Use  Smoking Status Former   Current packs/day: 0.00   Average packs/day: 1.1 packs/day for 20.0 years (22.5 ttl pk-yrs)   Types: Cigarettes   Start date: 2002   Quit date: 2017   Years since quitting: 8.3  Smokeless Tobacco Never  Allergies  Allergen Reactions   Atenolol Anaphylaxis   Codeine Other (See Comments)    Pt says she gets "crazy"   Objective:  There were no vitals filed for this visit. There is no height or weight on file to calculate BMI. Constitutional Well developed. Well nourished.  Vascular Foot warm and well perfused. Capillary refill normal to all digits.   Neurologic Normal speech. Oriented to person, place, and time. Epicritic sensation to light touch grossly present bilaterally.  Dermatologic Dehiscence noted.  Does not probe down to deep tissue.  No exposure of bone noted.  No purulent drainage noted  Orthopedic: Tenderness to palpation noted about the surgical site.   Radiographs: None Assessment:  No diagnosis found. Plan:  Patient was  evaluated and treated and all questions answered.  S/p foot surgery right -Progressing as expected post-operatively. -XR: See above -WB Status: Partial bearing to the heel in surgical shoe -Sutures: Removed -Medications: None - Superficial dehiscence noted.  Continue Betadine wet-to-dry dressing  No follow-ups on file.

## 2023-09-19 ENCOUNTER — Telehealth: Payer: Self-pay | Admitting: *Deleted

## 2023-09-19 DIAGNOSIS — E1169 Type 2 diabetes mellitus with other specified complication: Secondary | ICD-10-CM | POA: Diagnosis not present

## 2023-09-19 DIAGNOSIS — M869 Osteomyelitis, unspecified: Secondary | ICD-10-CM | POA: Diagnosis not present

## 2023-09-19 DIAGNOSIS — E785 Hyperlipidemia, unspecified: Secondary | ICD-10-CM | POA: Diagnosis not present

## 2023-09-19 DIAGNOSIS — Z89431 Acquired absence of right foot: Secondary | ICD-10-CM | POA: Diagnosis not present

## 2023-09-19 DIAGNOSIS — Z4781 Encounter for orthopedic aftercare following surgical amputation: Secondary | ICD-10-CM | POA: Diagnosis not present

## 2023-09-19 DIAGNOSIS — I10 Essential (primary) hypertension: Secondary | ICD-10-CM | POA: Diagnosis not present

## 2023-09-19 DIAGNOSIS — J45909 Unspecified asthma, uncomplicated: Secondary | ICD-10-CM | POA: Diagnosis not present

## 2023-09-19 DIAGNOSIS — E1151 Type 2 diabetes mellitus with diabetic peripheral angiopathy without gangrene: Secondary | ICD-10-CM | POA: Diagnosis not present

## 2023-09-19 DIAGNOSIS — I70202 Unspecified atherosclerosis of native arteries of extremities, left leg: Secondary | ICD-10-CM | POA: Diagnosis not present

## 2023-09-19 NOTE — Telephone Encounter (Signed)
 Copied from CRM [id number]. Topic: Clinical - Home Health Verbal Orders >> Sep 19, 2023 12:46 PM DeAngela L wrote: Caller/Agency: [name] Physical therapist with [facility] home health Callback Number: [phone] Service Requested: none just calling to give blood pressure and sugar value  Any new concerns about the patient? No Blood pressure at the start of visit 180/90 after 5 minute rest period 160/84 end of the visit 160/81  and she had just taken her blood pressure medication right before he arrived  Blood sugar value 175 with her morning fast she ate late but no symptoms throughout the visit

## 2023-09-19 NOTE — Telephone Encounter (Signed)
 Calling to follow up with patient regarding blood pressure reading.  Has she been able to take BP reading since PT has left.   Left message on voicemail to return call.

## 2023-09-21 ENCOUNTER — Telehealth: Payer: Self-pay

## 2023-09-21 NOTE — Telephone Encounter (Signed)
 Received a message from [facility] PT - patient was seen Friday 5/2 - they are asking about her WB status

## 2023-09-23 ENCOUNTER — Telehealth: Payer: Self-pay

## 2023-09-23 DIAGNOSIS — I10 Essential (primary) hypertension: Secondary | ICD-10-CM | POA: Diagnosis not present

## 2023-09-23 DIAGNOSIS — E1151 Type 2 diabetes mellitus with diabetic peripheral angiopathy without gangrene: Secondary | ICD-10-CM | POA: Diagnosis not present

## 2023-09-23 DIAGNOSIS — E785 Hyperlipidemia, unspecified: Secondary | ICD-10-CM | POA: Diagnosis not present

## 2023-09-23 DIAGNOSIS — J45909 Unspecified asthma, uncomplicated: Secondary | ICD-10-CM | POA: Diagnosis not present

## 2023-09-23 DIAGNOSIS — Z89431 Acquired absence of right foot: Secondary | ICD-10-CM | POA: Diagnosis not present

## 2023-09-23 DIAGNOSIS — M869 Osteomyelitis, unspecified: Secondary | ICD-10-CM | POA: Diagnosis not present

## 2023-09-23 DIAGNOSIS — I70202 Unspecified atherosclerosis of native arteries of extremities, left leg: Secondary | ICD-10-CM | POA: Diagnosis not present

## 2023-09-23 DIAGNOSIS — Z4781 Encounter for orthopedic aftercare following surgical amputation: Secondary | ICD-10-CM | POA: Diagnosis not present

## 2023-09-23 DIAGNOSIS — E1169 Type 2 diabetes mellitus with other specified complication: Secondary | ICD-10-CM | POA: Diagnosis not present

## 2023-09-23 NOTE — Telephone Encounter (Signed)
 Copied from CRM [id number]. Topic: General - Other >> Sep 20, 2023  5:35 PM Felizardo Hotter wrote: Reason for CRM:Pt returning Amy Cuba, RN call from 09/19/2023.    Called patient and left voicemail

## 2023-09-27 ENCOUNTER — Other Ambulatory Visit: Payer: Self-pay

## 2023-09-27 ENCOUNTER — Telehealth: Payer: Self-pay | Admitting: Pharmacist

## 2023-09-27 ENCOUNTER — Ambulatory Visit: Payer: Self-pay | Attending: Family Medicine | Admitting: Pharmacist

## 2023-09-27 ENCOUNTER — Encounter: Payer: Self-pay | Admitting: Pharmacist

## 2023-09-27 DIAGNOSIS — Z7984 Long term (current) use of oral hypoglycemic drugs: Secondary | ICD-10-CM

## 2023-09-27 DIAGNOSIS — Z794 Long term (current) use of insulin: Secondary | ICD-10-CM

## 2023-09-27 DIAGNOSIS — E1169 Type 2 diabetes mellitus with other specified complication: Secondary | ICD-10-CM

## 2023-09-27 MED ORDER — FREESTYLE LIBRE 3 PLUS SENSOR MISC: 6 refills | Status: DC

## 2023-09-27 MED ORDER — FREESTYLE LIBRE 3 READER DEVI: 0 refills | Status: AC

## 2023-09-27 MED ORDER — INSULIN LISPRO (1 UNIT DIAL) 100 UNIT/ML (KWIKPEN): PEN_INJECTOR | SUBCUTANEOUS | 1 refills | Status: DC

## 2023-09-27 NOTE — Telephone Encounter (Signed)
 Can we submit a PA for patient's Amy Garner supplies?

## 2023-09-27 NOTE — Progress Notes (Signed)
 S:     No chief complaint on file.  68 y.o. female who presents for diabetes evaluation, education, and management.  PMH is significant for T2DM, HLD, GERD.  Patient was referred and last seen by Primary Care Provider, Dr. Elvan Hamel, on 09/05/2023. I have seen her before, but her last visit with me was last summer.   Today, patient arrives in good spirits and presents without any assistance.  Patient reports Diabetes was diagnosed > 10 years ago. Tells me she was placed on insulin  + PO medications upon dx. Denies any history of clinical ASCVD, CHF, or CKD. Denies any personal or family hx of thyroid cancer. No pancreatitis hx.  Since I last saw her, she was admitted 4/6-4/10/25 with diabetic foot wound and osteomyelitis. She was discharged on Augmentin  after completing several days of IV antibiotics. She also had wound vac placed after undergoing surgical debridement. Upon review of her charts, her A1c improved from 8.5 to 7.0 between 09/24/22 and 01/21/23 last year. However, she stopped Ozempic  d/t GI upset around 01/2023 and I have not had the chance to see her since.   Family/Social History:  Fhx: MI, asthma Tobacco: former smoker (quit in 2017) Alcohol : none reported   Current diabetes medications include: Toujeo  62 units daily, metformin  500 mg XR (1000mg  daily) Patient reports adherence to taking all medications as prescribed.   Insurance coverage: [employer]  Patient denies hypoglycemic events.  Reported home fasting blood sugars: 130s - 160s. Not checking post-prandial.   Patient denies nocturia (nighttime urination).  Patient denies neuropathy (nerve pain). Patient denies visual changes. Patient reports self foot exams.   Patient reported dietary habits:  -Since hospital admission, her daughter is helping her manage her diet.   Patient-reported exercise habits:  -None reported   O:   7 day average blood glucose: no meter with her today   No CGM in place.   Lab  Results  Component Value Date   HGBA1C 12.5 (A) 07/21/2023   There were no vitals filed for this visit.  Lipid Panel     Component Value Date/Time   CHOL 150 08/13/2022 1054   TRIG 144 08/13/2022 1054   HDL 42 08/13/2022 1054   CHOLHDL 3.6 08/13/2022 1054   LDLCALC 83 08/13/2022 1054    Clinical Atherosclerotic Cardiovascular Disease (ASCVD): No  The 10-year ASCVD risk score (Arnett DK, et al., 2019) is: 26.8%   Values used to calculate the score:     Age: 84 years     Sex: Female     Is Non-Hispanic African American: No     Diabetic: Yes     Tobacco smoker: No     Systolic Blood Pressure: 159 mmHg     Is BP treated: Yes     HDL Cholesterol: 42 mg/dL     Total Cholesterol: 150 mg/dL   A/P: Diabetes longstanding currently above goal based on her A1c. Control was worse closer to her previous hospital admission but has improved since. She is not currently symptomatis but is able to verbalize appropriate hypoglycemia management plan. Medication adherence appears appropriate. I recommend to add a conservative dose of prandial insulin  due to her need for continued wound healing and better glycemic control. However, we do not know her post-prandial/random home CBG data. I will have her begin use with the Oliver system and return in 1 month for further dose adjustment.  -Continue Toujeo  62 units once daily.  -Start  Humalog: take 2 units before your  two smaller meals and 4 units before your largest meal. Skip if pre-prandial glucose is < 100 mg/dL.  -Continue metformin  500 mg XR: two tablets (1000mg ) BID.  -Rxn sent for Libre 3 plus.  -Patient educated on purpose, proper use, and potential adverse effects of Humalog insulin . -Extensively discussed pathophysiology of diabetes, recommended lifestyle interventions, dietary effects on blood sugar control.  -Counseled on s/sx of and management of hypoglycemia.  -Next A1c anticipated 10/2023.   Written patient instructions provided. Patient  verbalized understanding of treatment plan.  Total time in face to face counseling 20 minutes.    Follow-up:  Pharmacist in 1 month.  Marene Shape, PharmD, Becky Bowels, CPP Clinical Pharmacist [facility] [phone]

## 2023-09-28 DIAGNOSIS — E1169 Type 2 diabetes mellitus with other specified complication: Secondary | ICD-10-CM | POA: Diagnosis not present

## 2023-09-28 DIAGNOSIS — Z4781 Encounter for orthopedic aftercare following surgical amputation: Secondary | ICD-10-CM | POA: Diagnosis not present

## 2023-09-28 DIAGNOSIS — Z89431 Acquired absence of right foot: Secondary | ICD-10-CM | POA: Diagnosis not present

## 2023-09-28 DIAGNOSIS — J45909 Unspecified asthma, uncomplicated: Secondary | ICD-10-CM | POA: Diagnosis not present

## 2023-09-28 DIAGNOSIS — E785 Hyperlipidemia, unspecified: Secondary | ICD-10-CM | POA: Diagnosis not present

## 2023-09-28 DIAGNOSIS — M869 Osteomyelitis, unspecified: Secondary | ICD-10-CM | POA: Diagnosis not present

## 2023-09-28 DIAGNOSIS — E1151 Type 2 diabetes mellitus with diabetic peripheral angiopathy without gangrene: Secondary | ICD-10-CM | POA: Diagnosis not present

## 2023-09-28 DIAGNOSIS — I70202 Unspecified atherosclerosis of native arteries of extremities, left leg: Secondary | ICD-10-CM | POA: Diagnosis not present

## 2023-09-28 DIAGNOSIS — I10 Essential (primary) hypertension: Secondary | ICD-10-CM | POA: Diagnosis not present

## 2023-09-30 ENCOUNTER — Ambulatory Visit: Admitting: Podiatry

## 2023-09-30 ENCOUNTER — Encounter: Payer: Self-pay | Admitting: Podiatry

## 2023-09-30 VITALS — Ht 66.0 in | Wt 236.0 lb

## 2023-09-30 DIAGNOSIS — Z89421 Acquired absence of other right toe(s): Secondary | ICD-10-CM

## 2023-09-30 DIAGNOSIS — M205X1 Other deformities of toe(s) (acquired), right foot: Secondary | ICD-10-CM

## 2023-09-30 NOTE — Progress Notes (Signed)
 Subjective:  Patient ID: Amy Garner, female    DOB: 07-05-1955,  MRN: [id number]  Chief Complaint  Patient presents with   Foot Pain    Patient is here for her 3 wk follow-up.    DOS: 08/24/2023 Procedure: Right partial first ray amputation  68 y.o. female returns for post-op check.  Patient states she is doing okay denies any other acute complaints.  Bandage bandages clean dry and intact.  Partial weightbearing to the heel  Review of Systems: Negative except as noted in the HPI. Denies N/V/F/Ch.  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 E11.69, Disp: 1 each, Rfl: 0   Continuous Glucose Sensor (FREESTYLE LIBRE 3 PLUS SENSOR) MISC, Check blood sugar continuously throughout the day. Change sensor every 15 days. E11.69, Disp: 2 each, Rfl: 6   esomeprazole  (NEXIUM ) 40 MG capsule, TAKE 1 CAPSULE (40 MG TOTAL) BY MOUTH IN THE MORNING., Disp: 90 capsule, Rfl: 3   FLUoxetine  (PROZAC ) 20 MG capsule, Take 1 capsule (20 mg total) by mouth daily., Disp: 90 capsule, Rfl: 1   glucose blood test strip, Use as  instructed, Disp: 100 each, Rfl: 5   ibuprofen  (ADVIL ) 400 MG tablet, Take 400 mg by mouth every 6 (six) hours as needed for moderate pain (pain score 4-6)., Disp: , Rfl:    insulin  glargine, 1 Unit Dial , (TOUJEO  SOLOSTAR) 300 UNIT/ML Solostar Pen, Inject 62 Units into the skin daily., Disp: 4.5 mL, Rfl: 3   insulin  lispro (HUMALOG  KWIKPEN) 100 UNIT/ML KwikPen, Inject 2 units subcutaneously before breakfast, 2 units before lunch, and 4 units before dinner. Do not use if pre-prandial sugar is under 100., Disp: 15 mL, Rfl: 1   Insulin  Pen Needle (PEN NEEDLES) 32G X 4 MM MISC, Use to inject insulin  once daily., Disp: 100 each, Rfl: 3   losartan  (COZAAR ) 25 MG tablet, TAKE 1 TABLET EVERY DAY, Disp: 90 tablet, Rfl: 3   lovastatin  (MEVACOR ) 10 MG tablet, TAKE 1 TABLET AT BEDTIME, Disp: 90 tablet, Rfl: 3   meclizine  (ANTIVERT ) 25 MG tablet, TAKE 1 TABLET (25 MG TOTAL) BY MOUTH 3 (THREE) TIMES DAILY AS NEEDED FOR DIZZINESS., Disp: 30 tablet, Rfl: 0   metFORMIN  (GLUCOPHAGE -XR) 500 MG 24 hr tablet, TAKE 2 TABLETS twice daily with meals., Disp: 360 tablet, Rfl: 2   Misc. Devices MISC, TRUE MATRIX METER, Disp: 1 each, Rfl: 0   oxyCODONE -acetaminophen  (PERCOCET) 5-325 MG tablet, Take 1 tablet by mouth every 4 (  four) hours as needed for severe pain (pain score 7-10)., Disp: 30 tablet, Rfl: 0   triamterene -hydrochlorothiazide  (DYAZIDE ) 37.5-25 MG capsule, TAKE 1 CAPSULE EVERY DAY, Disp: 90 capsule, Rfl: 3   TRUEplus Lancets 33G MISC, USE TO TAKE BLOOD SUGAR 2 TIMES A DAY, Disp: 100 each, Rfl: 2  Social History   Tobacco Use  Smoking Status Former   Current packs/day: 0.00   Average packs/day: 1.1 packs/day for 20.0 years (22.5 ttl pk-yrs)   Types: Cigarettes   Start date: 2002   Quit date: 2017   Years since quitting: 8.3  Smokeless Tobacco Never    Allergies  Allergen Reactions   Atenolol Anaphylaxis   Codeine Other (See Comments)    Pt says she gets "crazy"   Objective:  There were no vitals  filed for this visit. Body mass index is 38.09 kg/m. Constitutional Well developed. Well nourished.  Vascular Foot warm and well perfused. Capillary refill normal to all digits.   Neurologic Normal speech. Oriented to person, place, and time. Epicritic sensation to light touch grossly present bilaterally.  Dermatologic Dehiscence noted.  Does not probe down to deep tissue.  No exposure of bone noted.  No purulent drainage noted 68 woman noted to dehiscence  Orthopedic: Tenderness to palpation noted about the surgical site.   Radiographs: None Assessment:  No diagnosis found. Plan:  Patient was evaluated and treated and all questions answered.  S/p foot surgery right -Progressing as expected post-operatively. -XR: See above -WB Status: Partial bearing to the heel in surgical shoe -Sutures: Removed -Medications: None - Superficial dehiscence noted with some improvement.  Continue Betadine wet-to-dry dressing  No follow-ups on file.

## 2023-10-05 ENCOUNTER — Telehealth: Payer: Self-pay

## 2023-10-05 DIAGNOSIS — I10 Essential (primary) hypertension: Secondary | ICD-10-CM | POA: Diagnosis not present

## 2023-10-05 DIAGNOSIS — Z4781 Encounter for orthopedic aftercare following surgical amputation: Secondary | ICD-10-CM | POA: Diagnosis not present

## 2023-10-05 DIAGNOSIS — E785 Hyperlipidemia, unspecified: Secondary | ICD-10-CM | POA: Diagnosis not present

## 2023-10-05 DIAGNOSIS — M869 Osteomyelitis, unspecified: Secondary | ICD-10-CM | POA: Diagnosis not present

## 2023-10-05 DIAGNOSIS — J45909 Unspecified asthma, uncomplicated: Secondary | ICD-10-CM | POA: Diagnosis not present

## 2023-10-05 DIAGNOSIS — E1151 Type 2 diabetes mellitus with diabetic peripheral angiopathy without gangrene: Secondary | ICD-10-CM | POA: Diagnosis not present

## 2023-10-05 DIAGNOSIS — I70202 Unspecified atherosclerosis of native arteries of extremities, left leg: Secondary | ICD-10-CM | POA: Diagnosis not present

## 2023-10-05 DIAGNOSIS — Z89431 Acquired absence of right foot: Secondary | ICD-10-CM | POA: Diagnosis not present

## 2023-10-05 DIAGNOSIS — E1169 Type 2 diabetes mellitus with other specified complication: Secondary | ICD-10-CM | POA: Diagnosis not present

## 2023-10-05 NOTE — Telephone Encounter (Signed)
 Thephysical therapist called while at the patient's home, asking about her WB status. I read the office note from 09/30/23, she is to be partial WB to the heel in the surgical shoe. Patient was arguing in the background - she thought she could be full WB in the surgical shoe. Advised to follow instructions in the office note. If her WB status is different, please let me know and I will call PT -thanks

## 2023-10-12 DIAGNOSIS — Z89431 Acquired absence of right foot: Secondary | ICD-10-CM | POA: Diagnosis not present

## 2023-10-12 DIAGNOSIS — Z4781 Encounter for orthopedic aftercare following surgical amputation: Secondary | ICD-10-CM | POA: Diagnosis not present

## 2023-10-12 DIAGNOSIS — J45909 Unspecified asthma, uncomplicated: Secondary | ICD-10-CM | POA: Diagnosis not present

## 2023-10-12 DIAGNOSIS — E1151 Type 2 diabetes mellitus with diabetic peripheral angiopathy without gangrene: Secondary | ICD-10-CM | POA: Diagnosis not present

## 2023-10-12 DIAGNOSIS — E1169 Type 2 diabetes mellitus with other specified complication: Secondary | ICD-10-CM | POA: Diagnosis not present

## 2023-10-12 DIAGNOSIS — E785 Hyperlipidemia, unspecified: Secondary | ICD-10-CM | POA: Diagnosis not present

## 2023-10-12 DIAGNOSIS — I10 Essential (primary) hypertension: Secondary | ICD-10-CM | POA: Diagnosis not present

## 2023-10-12 DIAGNOSIS — I70202 Unspecified atherosclerosis of native arteries of extremities, left leg: Secondary | ICD-10-CM | POA: Diagnosis not present

## 2023-10-12 DIAGNOSIS — M869 Osteomyelitis, unspecified: Secondary | ICD-10-CM | POA: Diagnosis not present

## 2023-10-13 DIAGNOSIS — E1151 Type 2 diabetes mellitus with diabetic peripheral angiopathy without gangrene: Secondary | ICD-10-CM | POA: Diagnosis not present

## 2023-10-13 DIAGNOSIS — Z4781 Encounter for orthopedic aftercare following surgical amputation: Secondary | ICD-10-CM | POA: Diagnosis not present

## 2023-10-13 DIAGNOSIS — E1169 Type 2 diabetes mellitus with other specified complication: Secondary | ICD-10-CM | POA: Diagnosis not present

## 2023-10-13 DIAGNOSIS — I10 Essential (primary) hypertension: Secondary | ICD-10-CM | POA: Diagnosis not present

## 2023-10-13 DIAGNOSIS — E785 Hyperlipidemia, unspecified: Secondary | ICD-10-CM | POA: Diagnosis not present

## 2023-10-13 DIAGNOSIS — I70202 Unspecified atherosclerosis of native arteries of extremities, left leg: Secondary | ICD-10-CM | POA: Diagnosis not present

## 2023-10-13 DIAGNOSIS — Z89431 Acquired absence of right foot: Secondary | ICD-10-CM | POA: Diagnosis not present

## 2023-10-13 DIAGNOSIS — M869 Osteomyelitis, unspecified: Secondary | ICD-10-CM | POA: Diagnosis not present

## 2023-10-13 DIAGNOSIS — J45909 Unspecified asthma, uncomplicated: Secondary | ICD-10-CM | POA: Diagnosis not present

## 2023-10-14 ENCOUNTER — Telehealth: Payer: Self-pay | Admitting: *Deleted

## 2023-10-14 NOTE — Telephone Encounter (Signed)
 Jim with [facility] needs to know how Amy Garner is suppose to be taking insulin  lispro (HUMALOG  KWIKPEN) 100 UNIT/ML KwikPen. He said she is not taking medication like it reads on her prescription. Please call him back at  [phone]; he has a secured voicemail to leave a message. Thanks

## 2023-10-17 DIAGNOSIS — E785 Hyperlipidemia, unspecified: Secondary | ICD-10-CM | POA: Diagnosis not present

## 2023-10-17 DIAGNOSIS — E1169 Type 2 diabetes mellitus with other specified complication: Secondary | ICD-10-CM | POA: Diagnosis not present

## 2023-10-17 DIAGNOSIS — I70202 Unspecified atherosclerosis of native arteries of extremities, left leg: Secondary | ICD-10-CM | POA: Diagnosis not present

## 2023-10-17 DIAGNOSIS — Z4781 Encounter for orthopedic aftercare following surgical amputation: Secondary | ICD-10-CM | POA: Diagnosis not present

## 2023-10-17 DIAGNOSIS — E1151 Type 2 diabetes mellitus with diabetic peripheral angiopathy without gangrene: Secondary | ICD-10-CM | POA: Diagnosis not present

## 2023-10-17 DIAGNOSIS — J45909 Unspecified asthma, uncomplicated: Secondary | ICD-10-CM | POA: Diagnosis not present

## 2023-10-17 DIAGNOSIS — M869 Osteomyelitis, unspecified: Secondary | ICD-10-CM | POA: Diagnosis not present

## 2023-10-17 DIAGNOSIS — I10 Essential (primary) hypertension: Secondary | ICD-10-CM | POA: Diagnosis not present

## 2023-10-17 DIAGNOSIS — Z89431 Acquired absence of right foot: Secondary | ICD-10-CM | POA: Diagnosis not present

## 2023-10-26 ENCOUNTER — Ambulatory Visit: Admitting: Podiatry

## 2023-10-26 DIAGNOSIS — I70202 Unspecified atherosclerosis of native arteries of extremities, left leg: Secondary | ICD-10-CM | POA: Diagnosis not present

## 2023-10-26 DIAGNOSIS — Z4781 Encounter for orthopedic aftercare following surgical amputation: Secondary | ICD-10-CM | POA: Diagnosis not present

## 2023-10-26 DIAGNOSIS — M869 Osteomyelitis, unspecified: Secondary | ICD-10-CM | POA: Diagnosis not present

## 2023-10-26 DIAGNOSIS — E1151 Type 2 diabetes mellitus with diabetic peripheral angiopathy without gangrene: Secondary | ICD-10-CM | POA: Diagnosis not present

## 2023-10-26 DIAGNOSIS — E1169 Type 2 diabetes mellitus with other specified complication: Secondary | ICD-10-CM | POA: Diagnosis not present

## 2023-10-26 DIAGNOSIS — E785 Hyperlipidemia, unspecified: Secondary | ICD-10-CM | POA: Diagnosis not present

## 2023-10-26 DIAGNOSIS — Z9889 Other specified postprocedural states: Secondary | ICD-10-CM

## 2023-10-26 DIAGNOSIS — Z89431 Acquired absence of right foot: Secondary | ICD-10-CM | POA: Diagnosis not present

## 2023-10-26 DIAGNOSIS — I10 Essential (primary) hypertension: Secondary | ICD-10-CM | POA: Diagnosis not present

## 2023-10-26 DIAGNOSIS — Z89421 Acquired absence of other right toe(s): Secondary | ICD-10-CM

## 2023-10-26 DIAGNOSIS — J45909 Unspecified asthma, uncomplicated: Secondary | ICD-10-CM | POA: Diagnosis not present

## 2023-10-26 MED ORDER — SANTYL 250 UNIT/GM EX OINT: 1.0000 | TOPICAL_OINTMENT | Freq: Every day | CUTANEOUS | 0 refills | Status: DC

## 2023-10-26 NOTE — Progress Notes (Signed)
 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 female returns for post-op check.  Patient states she is doing okay denies any other acute complaints.  Patient states that she is doing well she has been doing Betadine wet-to-dry dressing denies any other acute complaints Review of Systems: Negative except as noted in the HPI. Denies N/V/F/Ch.  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 Wound size is 0.5 cm x 0.3 cm x 0.3, Disp: 30 g, Rfl: 0   fluconazole  (DIFLUCAN ) 150 MG tablet, Take 150 mg by mouth once., Disp: , Rfl:    albuterol  (VENTOLIN  HFA) 108 (90 Base) MCG/ACT inhaler, Inhale 2 puffs into the lungs every 6 (six) hours as needed for wheezing or shortness of breath., Disp: 18 g, Rfl: 5   Alcohol  Swabs  PADS, USE TO TAKE BLOOD SUGAR 2 TIMES A DAY, Disp: 120 each, Rfl: 2   cholecalciferol (VITAMIN D3) 25 MCG (1000 UNIT) tablet, Take 1,000 Units by mouth daily., Disp: , Rfl:    Continuous Glucose Receiver (FREESTYLE LIBRE 3 READER) DEVI, Check blood sugar continuously throughout the day. E11.69, Disp: 1 each, Rfl: 0   Continuous Glucose Sensor (FREESTYLE LIBRE 3 PLUS SENSOR) MISC, Check blood sugar continuously throughout the day. Change sensor every 15 days.  E11.69, Disp: 2 each, Rfl: 6   esomeprazole  (NEXIUM ) 40 MG capsule, TAKE 1 CAPSULE (40 MG TOTAL) BY MOUTH IN THE MORNING., Disp: 90 capsule, Rfl: 3   FLUoxetine  (PROZAC ) 20 MG capsule, Take 1 capsule (20 mg total) by mouth daily., Disp: 90 capsule, Rfl: 1   glucose blood test strip, Use as instructed, Disp: 100 each, Rfl: 5   ibuprofen  (ADVIL ) 400 MG tablet, Take 400 mg by mouth every 6 (six) hours as needed for moderate pain (pain score 4-6)., Disp: , Rfl:    insulin  glargine, 1 Unit Dial , (TOUJEO  SOLOSTAR) 300 UNIT/ML Solostar Pen, Inject 62 Units into the skin daily., Disp: 4.5 mL, Rfl: 3   insulin  lispro (HUMALOG  KWIKPEN) 100 UNIT/ML KwikPen, Inject 6 units subcutaneously before breakfast, 6 units before lunch, and 10 units before dinner. Do not use if pre-prandial sugar is under 100., Disp: 15 mL, Rfl: 1   Insulin  Pen Needle (PEN NEEDLES) 32G X 4 MM MISC, Use to inject insulin  once daily., Disp: 100 each, Rfl: 3   losartan  (COZAAR ) 25 MG tablet, TAKE 1 TABLET EVERY DAY, Disp: 90 tablet, Rfl: 3   lovastatin  (MEVACOR ) 10 MG tablet, TAKE 1 TABLET AT BEDTIME, Disp: 90 tablet, Rfl: 3   meclizine  (ANTIVERT ) 25 MG tablet, TAKE 1 TABLET (25 MG TOTAL) BY MOUTH 3 (THREE) TIMES DAILY AS NEEDED  FOR DIZZINESS., Disp: 30 tablet, Rfl: 0   metFORMIN  (GLUCOPHAGE -XR) 500 MG 24 hr tablet, TAKE 2 TABLETS twice daily with meals., Disp: 360 tablet, Rfl: 2   Misc. Devices MISC, TRUE MATRIX METER, Disp: 1 each, Rfl: 0   oxyCODONE -acetaminophen  (PERCOCET) 5-325 MG tablet, Take 1 tablet by mouth every 4 (four) hours as needed for severe pain (pain score 7-10)., Disp: 30 tablet, Rfl: 0   triamterene -hydrochlorothiazide  (DYAZIDE ) 37.5-25 MG capsule, TAKE 1 CAPSULE EVERY DAY, Disp: 90 capsule, Rfl: 3   TRUEplus Lancets 33G MISC, USE TO TAKE BLOOD SUGAR 2 TIMES A DAY, Disp: 100 each, Rfl: 2  Social History   Tobacco Use  Smoking Status Former   Current packs/day: 0.00   Average packs/day: 1.1 packs/day for 20.0 years  (22.5 ttl pk-yrs)   Types: Cigarettes   Start date: 2002   Quit date: 2017   Years since quitting: 8.4  Smokeless Tobacco Never    Allergies  Allergen Reactions   Atenolol Anaphylaxis   Codeine Other (See Comments)    Pt says she gets crazy   Objective:  There were no vitals filed for this visit. There is no height or weight on file to calculate BMI. Constitutional Well developed. Well nourished.  Vascular Foot warm and well perfused. Capillary refill normal to all digits.   Neurologic Normal speech. Oriented to person, place, and time. Epicritic sensation to light touch grossly present bilaterally.  Dermatologic Dehiscence noted superficial now.  Does not probe down to deep tissue.  No exposure of bone noted.  No purulent drainage noted 69 woman noted to dehiscence  Orthopedic: Tenderness to palpation noted about the surgical site.   Radiographs: None Assessment:  No diagnosis found. Plan:  Patient was evaluated and treated and all questions answered.  S/p foot surgery right -Progressing as expected post-operatively. -XR: See above -WB Status: Partial bearing to the heel in surgical shoe -Sutures: Removed -Medications: None - There is still some superficial dehiscence noted.  At this time I discussed with the patient that she will benefit from Santyl  wet-to-dry dressing sent to was sent to the pharmacy. -Wound treatment was0.5 cm x 0.3 cm x 0.3  No follow-ups on file.

## 2023-10-27 ENCOUNTER — Telehealth: Payer: Self-pay | Admitting: Podiatry

## 2023-10-27 ENCOUNTER — Telehealth: Payer: Self-pay

## 2023-10-27 DIAGNOSIS — I70202 Unspecified atherosclerosis of native arteries of extremities, left leg: Secondary | ICD-10-CM | POA: Diagnosis not present

## 2023-10-27 DIAGNOSIS — I10 Essential (primary) hypertension: Secondary | ICD-10-CM | POA: Diagnosis not present

## 2023-10-27 DIAGNOSIS — J45909 Unspecified asthma, uncomplicated: Secondary | ICD-10-CM | POA: Diagnosis not present

## 2023-10-27 DIAGNOSIS — E1169 Type 2 diabetes mellitus with other specified complication: Secondary | ICD-10-CM | POA: Diagnosis not present

## 2023-10-27 DIAGNOSIS — E1151 Type 2 diabetes mellitus with diabetic peripheral angiopathy without gangrene: Secondary | ICD-10-CM | POA: Diagnosis not present

## 2023-10-27 DIAGNOSIS — Z4781 Encounter for orthopedic aftercare following surgical amputation: Secondary | ICD-10-CM | POA: Diagnosis not present

## 2023-10-27 DIAGNOSIS — E785 Hyperlipidemia, unspecified: Secondary | ICD-10-CM | POA: Diagnosis not present

## 2023-10-27 DIAGNOSIS — M869 Osteomyelitis, unspecified: Secondary | ICD-10-CM | POA: Diagnosis not present

## 2023-10-27 DIAGNOSIS — Z89431 Acquired absence of right foot: Secondary | ICD-10-CM | POA: Diagnosis not present

## 2023-10-27 NOTE — Progress Notes (Signed)
 S:     No chief complaint on file.  68 y.o. female who presents for diabetes evaluation, education, and management.  PMH is significant for T2DM, HLD, GERD.  Patient was referred and last seen by Primary Care Provider, Dr. Elvan Hamel, on 09/05/2023. At her last visit with pharmacy on 09/27/23, Toujeo  was continued and Humalog  was initiated.   Today, patient arrives in good spirits and presents without any assistance.  Patient reports Diabetes was diagnosed > 10 years ago. Tells me she was placed on insulin  + PO medications upon dx. Denies any history of clinical ASCVD, CHF, or CKD. Denies any personal or family hx of thyroid cancer. No pancreatitis hx. Since last visit, she reports good adherence to her Lantus  and Humalog . Saw Podiatry earlier this week and wound is healing nicely. She has been able to pick up and use her [employer] successfully. No issues with sensors falling off early. No issues with changes to insulin .   Family/Social History:  Fhx: MI, asthma Tobacco: former smoker (quit in 2017) Alcohol : none reported   Current diabetes medications include: Toujeo  62 units daily, metformin  500 mg XR (1000mg  daily), Humalog  2 units before breakfast and lunch and 4 units before dinner Previous diabetes medications tried: Ozempic  -- stopped d/t GI upset   Patient reports adherence to taking all medications as prescribed.  Insurance coverage: [employer]  Patient denies hypoglycemic events.   Patient denies nocturia (nighttime urination).  Patient denies neuropathy (nerve pain). Patient denies visual changes. Patient reports self foot exams.   Patient reported dietary habits:  -Since hospital admission, her daughter is helping her manage her diet.  Patient-reported exercise habits:  -None reported  O:  Freestyle Libre 3 report: 30 day report Time active: 72% (had just started using, but 14 day report shows 97% usage)  Average blood glucose: 173 mg/dL Time above  range (>161 mg/dL): 09% Time in range (60-454 mg/dL): 09% Time below range (<70 mg/dL): 0%  Lab Results  Component Value Date   HGBA1C 9.9 (A) 10/28/2023   There were no vitals filed for this visit.  Lipid Panel     Component Value Date/Time   CHOL 150 08/13/2022 1054   TRIG 144 08/13/2022 1054   HDL 42 08/13/2022 1054   CHOLHDL 3.6 08/13/2022 1054   LDLCALC 83 08/13/2022 1054    Clinical Atherosclerotic Cardiovascular Disease (ASCVD): No  The 10-year ASCVD risk score (Arnett DK, et al., 2019) is: 29.4%   Values used to calculate the score:     Age: 67 years     Clincally relevant sex: Female     Is Non-Hispanic African American: No     Diabetic: Yes     Tobacco smoker: No     Systolic Blood Pressure: 159 mmHg     Is BP treated: Yes     HDL Cholesterol: 42 mg/dL     Total Cholesterol: 150 mg/dL   A/P: Diabetes longstanding currently above goal but much improved from 12.5% now down to 9.9%, goal <7%. Control was worse closer to her previous hospital admission but has improved since. She is not currently symptomatic but is able to verbalize appropriate hypoglycemia management plan. Medication adherence appears appropriate. Wound healing nicely with no more infection. No issues with insurance with coverage for insulin  or CGM.  -Continue Toujeo  62 units once daily. -INCREASE Humalog : take 6 units before your two smaller meals and 10 units before your largest meal. Skip if pre-prandial glucose is < 100 mg/dL. -  Continue metformin  500 mg XR: two tablets (1000mg ) BID.  -Continue utilizing Freestyle Libre sensors. Advised her to call [facility] if she runs into any issues with sensors.  -Patient educated on purpose, proper use, and potential adverse effects of Humalog  insulin . -Extensively discussed pathophysiology of diabetes, recommended lifestyle interventions, dietary effects on blood sugar control.  -Counseled on s/sx of and management of hypoglycemia.  -Next A1c anticipated  01/2024.  Written patient instructions provided. Patient verbalized understanding of treatment plan.  Total time in face to face counseling 20 minutes.    Follow-up:  Pharmacist: 1 month PCP: 12/05/23 and 01/23/2024  Juleen Oakland, PharmD PGY1 Pharmacy Resident

## 2023-10-27 NOTE — Telephone Encounter (Signed)
 Patient called in this morning about Santyl prescription. Patient stated she had not received a message from pharmacy in regards to her prescription being ready. I told patient to call and make sure they had received it and if not to call back and let us  know. Patient left a message on machine over lunch stating that there was a PA sent to our office yesterday from pharmacy that needs to be completed. Can someone look into this for her please and thank you!

## 2023-10-27 NOTE — Telephone Encounter (Signed)
 Josiah Nigh called and left a message - any WB restrictions? Specific shoes she should be wearing? Please call back to advise -patient was seen yesterday in our office - [facility] is discharging patient

## 2023-10-28 ENCOUNTER — Ambulatory Visit: Attending: Family Medicine | Admitting: Pharmacist

## 2023-10-28 ENCOUNTER — Encounter: Payer: Self-pay | Admitting: Pharmacist

## 2023-10-28 ENCOUNTER — Telehealth: Payer: Self-pay

## 2023-10-28 DIAGNOSIS — Z794 Long term (current) use of insulin: Secondary | ICD-10-CM

## 2023-10-28 DIAGNOSIS — E1169 Type 2 diabetes mellitus with other specified complication: Secondary | ICD-10-CM

## 2023-10-28 DIAGNOSIS — Z7984 Long term (current) use of oral hypoglycemic drugs: Secondary | ICD-10-CM | POA: Diagnosis not present

## 2023-10-28 LAB — POCT GLYCOSYLATED HEMOGLOBIN (HGB A1C): Hemoglobin A1C: 9.9 % — AB (ref 4.0–5.6)

## 2023-10-28 MED ORDER — INSULIN LISPRO (1 UNIT DIAL) 100 UNIT/ML (KWIKPEN)
PEN_INJECTOR | SUBCUTANEOUS | 1 refills | Status: DC
Start: 2023-10-28 — End: 2024-01-13

## 2023-10-28 MED ORDER — TOUJEO SOLOSTAR 300 UNIT/ML ~~LOC~~ SOPN: 62.0000 [IU] | PEN_INJECTOR | Freq: Every day | SUBCUTANEOUS | 3 refills | Status: DC

## 2023-10-28 NOTE — Telephone Encounter (Signed)
 PA request received for Santyl  250 unit/gm. PA submitted and waiting on response  Sharilyn Davenport  (Key: [id number]) PA Case ID #: [id number] Rx #: [id number]

## 2023-10-28 NOTE — Telephone Encounter (Signed)
 PA was approved 05/18/23-05/16/2024

## 2023-11-01 ENCOUNTER — Emergency Department (HOSPITAL_COMMUNITY)
Admission: EM | Admit: 2023-11-01 | Discharge: 2023-11-01 | Disposition: A | Discharge status: Home / Self Care | Attending: Emergency Medicine | Admitting: Emergency Medicine

## 2023-11-01 ENCOUNTER — Other Ambulatory Visit: Payer: Self-pay

## 2023-11-01 ENCOUNTER — Encounter (HOSPITAL_COMMUNITY): Payer: Self-pay

## 2023-11-01 ENCOUNTER — Telehealth: Payer: Self-pay | Admitting: Podiatry

## 2023-11-01 ENCOUNTER — Emergency Department (HOSPITAL_COMMUNITY)

## 2023-11-01 DIAGNOSIS — Z87891 Personal history of nicotine dependence: Secondary | ICD-10-CM | POA: Diagnosis not present

## 2023-11-01 DIAGNOSIS — Z89421 Acquired absence of other right toe(s): Secondary | ICD-10-CM | POA: Diagnosis not present

## 2023-11-01 DIAGNOSIS — I10 Essential (primary) hypertension: Secondary | ICD-10-CM | POA: Diagnosis not present

## 2023-11-01 DIAGNOSIS — M7731 Calcaneal spur, right foot: Secondary | ICD-10-CM | POA: Diagnosis not present

## 2023-11-01 DIAGNOSIS — J45909 Unspecified asthma, uncomplicated: Secondary | ICD-10-CM | POA: Insufficient documentation

## 2023-11-01 DIAGNOSIS — T8130XA Disruption of wound, unspecified, initial encounter: Secondary | ICD-10-CM

## 2023-11-01 DIAGNOSIS — Y838 Other surgical procedures as the cause of abnormal reaction of the patient, or of later complication, without mention of misadventure at the time of the procedure: Secondary | ICD-10-CM | POA: Insufficient documentation

## 2023-11-01 DIAGNOSIS — T81328A Disruption or dehiscence of closure of other specified internal operation (surgical) wound, initial encounter: Secondary | ICD-10-CM | POA: Insufficient documentation

## 2023-11-01 DIAGNOSIS — M7989 Other specified soft tissue disorders: Secondary | ICD-10-CM | POA: Diagnosis not present

## 2023-11-01 DIAGNOSIS — E119 Type 2 diabetes mellitus without complications: Secondary | ICD-10-CM | POA: Insufficient documentation

## 2023-11-01 DIAGNOSIS — Z89411 Acquired absence of right great toe: Secondary | ICD-10-CM | POA: Diagnosis not present

## 2023-11-01 DIAGNOSIS — Z79899 Other long term (current) drug therapy: Secondary | ICD-10-CM | POA: Diagnosis not present

## 2023-11-01 DIAGNOSIS — Z794 Long term (current) use of insulin: Secondary | ICD-10-CM | POA: Diagnosis not present

## 2023-11-01 DIAGNOSIS — Z7951 Long term (current) use of inhaled steroids: Secondary | ICD-10-CM | POA: Diagnosis not present

## 2023-11-01 DIAGNOSIS — T8131XA Disruption of external operation (surgical) wound, not elsewhere classified, initial encounter: Secondary | ICD-10-CM | POA: Diagnosis not present

## 2023-11-01 LAB — CBC WITH DIFFERENTIAL/PLATELET
Abs Immature Granulocytes: 0.03 10*3/uL (ref 0.00–0.07)
Basophils Absolute: 0.1 10*3/uL (ref 0.0–0.1)
Basophils Relative: 1 %
Eosinophils Absolute: 0.2 10*3/uL (ref 0.0–0.5)
Eosinophils Relative: 3 %
HCT: 39.8 % (ref 36.0–46.0)
Hemoglobin: 12.6 g/dL (ref 12.0–15.0)
Immature Granulocytes: 0 %
Lymphocytes Relative: 27 %
Lymphs Abs: 2.3 10*3/uL (ref 0.7–4.0)
MCH: 27.9 pg (ref 26.0–34.0)
MCHC: 31.7 g/dL (ref 30.0–36.0)
MCV: 88.2 fL (ref 80.0–100.0)
Monocytes Absolute: 0.6 10*3/uL (ref 0.1–1.0)
Monocytes Relative: 8 %
Neutro Abs: 5.1 10*3/uL (ref 1.7–7.7)
Neutrophils Relative %: 61 %
Platelets: 210 10*3/uL (ref 150–400)
RBC: 4.51 MIL/uL (ref 3.87–5.11)
RDW: 13.5 % (ref 11.5–15.5)
WBC: 8.2 10*3/uL (ref 4.0–10.5)
nRBC: 0 % (ref 0.0–0.2)

## 2023-11-01 LAB — BASIC METABOLIC PANEL WITH GFR
Anion gap: 9 (ref 5–15)
BUN: 19 mg/dL (ref 8–23)
CO2: 27 mmol/L (ref 22–32)
Calcium: 9.7 mg/dL (ref 8.9–10.3)
Chloride: 104 mmol/L (ref 98–111)
Creatinine, Ser: 0.89 mg/dL (ref 0.44–1.00)
GFR, Estimated: >60 mL/min (ref >60)
Glucose, Bld: 131 mg/dL — ABNORMAL HIGH (ref 70–99)
Potassium: 4.4 mmol/L (ref 3.5–5.1)
Sodium: 140 mmol/L (ref 135–145)

## 2023-11-01 NOTE — ED Triage Notes (Signed)
 Pt had right great toe removed in April. Pt states that it has been healing well but she has noticed recently that she has drainage from wound that looks like snot. Drainage started yesterday. Pt denies pain or injury.

## 2023-11-01 NOTE — ED Provider Notes (Signed)
 [place] [facility] Provider Note  CSN: [id number] Arrival date & time: 11/01/23 9164  Chief Complaint(s) Post-op Problem  HPI Amy Garner is a 67 y.o. female with PMHx T2DM, HLD, GERD presenting with yellow-green drainage from postop site after removal of first digit from her right foot.  Reports to change in drainage began yesterday evening.  States that used to be more clear to bloody but has since transition to more snot like.  Recently seen by podiatry on 6/11 and noted to have some dehiscence of the surgical site and recommended wet-to-dry dressings.  Patient reports she has been putting the Santyl  on the site and covering it with a Band-Aid.  Denies pain but notes some swelling in the area and reddish discoloration near the wound.  Denies fevers and N/V.  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 Authorizing Provider  albuterol  (VENTOLIN  HFA) 108 (90 Base) MCG/ACT inhaler Inhale 2 puffs into the lungs every 6 (six) hours as needed for wheezing or shortness of breath. 09/30/22   Tanda Bleacher, MD  Alcohol   Swabs  PADS USE TO TAKE BLOOD SUGAR 2 TIMES A DAY 07/27/21   Tanda Bleacher, MD  cholecalciferol (VITAMIN D3) 25 MCG (1000 UNIT) tablet Take 1,000 Units by mouth daily.    [provider]  collagenase  (SANTYL ) 250 UNIT/GM ointment Apply 1 Application topically daily. Wound size is 0.5 cm x 0.3 cm x 0.3 10/26/23   Tobie Franky SQUIBB, DPM  Continuous Glucose Receiver (FREESTYLE LIBRE 3 READER) DEVI Check blood sugar continuously throughout the day. E11.69 09/27/23   Newlin, Enobong, MD  Continuous Glucose Sensor (FREESTYLE LIBRE 3 PLUS SENSOR) MISC Check blood sugar continuously throughout the day. Change sensor every 15 days. E11.69 09/27/23   Newlin, Enobong, MD  esomeprazole  (NEXIUM ) 40 MG capsule TAKE 1 CAPSULE (40 MG TOTAL) BY MOUTH IN THE MORNING. 01/18/23   Tanda Bleacher, MD  fluconazole  (DIFLUCAN ) 150 MG tablet Take 150 mg by mouth once. 09/09/23   [provider]  FLUoxetine  (PROZAC ) 20 MG capsule Take 1 capsule (20 mg total) by mouth daily. 07/21/23   Tanda Bleacher, MD  glucose blood test strip Use as instructed 09/30/22   Tanda Bleacher, MD  ibuprofen  (ADVIL ) 400 MG tablet Take 400 mg by mouth every 6 (six) hours as needed for moderate pain (pain score 4-6).    [provider]  insulin  glargine, 1 Unit Dial , (TOUJEO  SOLOSTAR) 300 UNIT/ML Solostar Pen Inject 62 Units into the skin daily. 10/28/23   Newlin,  Enobong, MD  insulin  lispro (HUMALOG  KWIKPEN) 100 UNIT/ML KwikPen Inject 6 units subcutaneously before breakfast, 6 units before lunch, and 10 units before dinner. Do not use if pre-prandial sugar is under 100. 10/28/23   Newlin, Enobong, MD  Insulin  Pen Needle (PEN NEEDLES) 32G X 4 MM MISC Use to inject insulin  once daily. 06/18/22   Newlin, Enobong, MD  losartan  (COZAAR ) 25 MG tablet TAKE 1 TABLET EVERY DAY 06/11/22   Tanda Bleacher, MD  lovastatin  (MEVACOR ) 10 MG tablet TAKE 1 TABLET AT BEDTIME 04/27/23   Tanda Bleacher, MD  meclizine  (ANTIVERT ) 25 MG tablet TAKE 1 TABLET (25 MG  TOTAL) BY MOUTH 3 (THREE) TIMES DAILY AS NEEDED FOR DIZZINESS. 07/01/23   Tanda Bleacher, MD  metFORMIN  (GLUCOPHAGE -XR) 500 MG 24 hr tablet TAKE 2 TABLETS twice daily with meals. 09/24/22   Delbert Clam, MD  Misc. Devices MISC TRUE MATRIX METER 09/30/22   Tanda Bleacher, MD  oxyCODONE -acetaminophen  (PERCOCET) 5-325 MG tablet Take 1 tablet by mouth every 4 (four) hours as needed for severe pain (pain score 7-10). 08/25/23   Vernon Ranks, MD  triamterene -hydrochlorothiazide  (DYAZIDE ) 37.5-25 MG capsule TAKE 1 CAPSULE EVERY DAY 06/11/22   Tanda Bleacher, MD  TRUEplus Lancets 33G MISC USE TO TAKE BLOOD SUGAR 2 TIMES A DAY 07/27/21   Tanda Bleacher, MD                                                                                                                                    Past Surgical History Past Surgical History:  Procedure Laterality Date   AMPUTATION Right 08/22/2023   Procedure: AMPUTATION, FOOT, RAY;  Surgeon: Malvin Marsa FALCON, DPM;  Location: [facility];  Service: Orthopedics/Podiatry;  Laterality: Right;   APPENDECTOMY  1990   APPLICATION OF WOUND VAC Right 08/22/2023   Procedure: APPLICATION, WOUND VAC;  Surgeon: Malvin Marsa FALCON, DPM;  Location: [facility];  Service: Orthopedics/Podiatry;  Laterality: Right;   BREAST BIOPSY     BUNIONECTOMY Right    CAPSULOTOMY METATARSOPHALANGEAL Right 02/07/2023   Procedure: CAPSULOTOMY METATARSOPHALANGEAL SECOND AND THIRD;  Surgeon: Tobie Franky SQUIBB, DPM;  Location: [facility];  Service: Podiatry;  Laterality: Right;   CERVICAL DISC SURGERY     CHOLECYSTECTOMY     COLONOSCOPY     DIAGNOSTIC LAPAROSCOPY  1980   endometriosis   HALLUX FUSION Right 02/07/2023   Procedure: HALLUX FUSION METATARSAL PHALANGEAL JOINT;  Surgeon: Tobie Franky SQUIBB, DPM;  Location: [facility];  Service: Podiatry;  Laterality: Right;  POPLITEAL BLOCK   HAMMER TOE SURGERY Right 02/07/2023   Procedure: HAMMER TOE CORRECTION SECOND AND THIRD;  Surgeon: Tobie Franky SQUIBB, DPM;   Location: [facility];  Service: Podiatry;  Laterality: Right;   HERNIA REPAIR  Inguinal right & left repair.   INGUINAL HERNIA REPAIR Left 05/02/2020   Procedure: OPEN LEFT INGUINAL HERNIA REPAIR WITH MESH, TAP BLOCK;  Surgeon: Signe Mitzie LABOR, MD;  Location: [facility];  Service: General;  Laterality: Left;   IRRIGATION AND DEBRIDEMENT FOOT Right 08/24/2023   Procedure: IRRIGATION AND DEBRIDEMENT FOOT;  Surgeon: Malvin Marsa FALCON, DPM;  Location: [facility];  Service: Orthopedics/Podiatry;  Laterality: Right;  Irrigation and Debridement Right Foot Wound with application of skin graft, Antibiotic Beads, and Delayed Closure of Wound   TUBAL LIGATION  1990s   Family History Family History  Problem Relation Age of Onset   Heart attack Mother    Asthma Mother    Heart disease Father    Hypertension Father    Stomach cancer Paternal Aunt    Colon cancer Paternal Aunt    Varicose Veins Maternal Grandmother    Diabetes Paternal Grandmother    ADD / ADHD Daughter    ADD / ADHD Son     Social History Social History   Tobacco Use   Smoking status: Former    Current packs/day: 0.00    Average packs/day: 1.1 packs/day for 20.0 years (22.5 ttl pk-yrs)    Types: Cigarettes    Start date: 2002    Quit date: 2017    Years since quitting: 8.4   Smokeless tobacco: Never  Vaping Use   Vaping status: Never Used  Substance Use Topics   Alcohol  use: Never   Drug use: Never   Allergies Atenolol and Codeine  Review of Systems A thorough review of systems was obtained and all systems are negative except as noted in the HPI and PMH.   Physical Exam Vital Signs  I have reviewed the triage vital signs BP (!) 140/73 (BP Location: Right Arm)   Pulse 63   Temp 97.6 F (36.4 C)   Resp 17   Ht 5' 6 (1.676 m)   Wt 106.6 kg   SpO2 97%   BMI 37.93 kg/m  Physical Exam  General: Well-appearing, alert, NAD ENTM: Moist mucus membranes. Neck: Supple, non-tender Cardiovascular: RRR without  murmur Respiratory: CTAB. Normal WOB on RA Gastrointestinal: Soft, non-tender, non-distended Psych: Cooperative, pleasant  MSK: Surgical site along first metatarsal region with 2 pinpoint areas of dehiscence.  Distal area with some yellow discharge noted and yellow crusting in the distal surgical site.  Erythema proximal to surgical site on forefoot noted.  2+ dorsalis pedis and posterior tibialis pulses.  Good distal cap refill.  Sensation intact.  Able to wiggle toes without difficulty.       ED Results and Treatments Labs (all labs ordered are listed, but only abnormal results are displayed) Labs Reviewed  BASIC METABOLIC PANEL WITH GFR - Abnormal; Notable for the following components:      Result Value   Glucose, Bld 131 (*)    All other components within normal limits  CBC WITH DIFFERENTIAL/PLATELET                                                                                                                          Radiology DG Foot Complete Right Result Date: 11/01/2023 CLINICAL  DATA:  Status post amputation. Discharge from right great toe surgical bed incision. EXAM: RIGHT FOOT COMPLETE - 3+ VIEW COMPARISON:  Right foot radiographs 08/22/2023 and 08/21/2023 FINDINGS: Redemonstration of amputation of the great toe to the proximal metatarsal shaft. Interval removal of the prior antibiotic beads within the distal surgical bed and interval removal of the prior medial wound VAC. Mild interval decrease in the prior soft tissue swelling with apparent baseline moderate the enlarged body habitus. The first metatarsal amputation margin appear sharp without definite cortical erosion seen. Mild calcification within the operative bed. No subcutaneous air. Small plantar and posterior calcaneal heel spurs. Minimal peripheral spurring at the base of the 2nd toe proximal phalanx without significant metatarsophalangeal joint space narrowing. Mild degenerate irregularity at the distal third metatarsal  head articular surface. IMPRESSION: 1. Redemonstration of amputation of the great toe to the proximal metatarsal shaft. Interval removal of the prior antibiotic beads within the distal surgical bed and interval removal of the prior medial wound VAC. 2. The distal first metatarsal amputation margin appears sharp. No definite radiographic evidence of osteomyelitis. Electronically Signed   By: Tanda Lyons M.D.   On: 11/01/2023 10:39    Pertinent labs & imaging results that were available during my care of the patient were reviewed by me and considered in my medical decision making (see MDM for details).  Medications Ordered in ED Medications - No data to display                                                                Medical Decision Making / ED Course    Medical Decision Making:    Amy Garner is a 68 y.o. female with PMHx T2DM, HLD, GERD presenting with yellow-green drainage from postop site. The complaint involves an extensive differential diagnosis and also carries with it a high risk of complications and morbidity.  Serious etiology was considered. Ddx includes but is not limited to: Cellulitis, osteomyelitis, surgical dehiscence  Complete initial physical exam performed, notably the patient was in no distress.    Reviewed and confirmed nursing documentation for past medical history, family history, social history.  Vital signs reviewed.     Brief summary:  68 year old female with history as above presenting with yellow drainage from postoperative surgical site after removal of first digit on her right foot.  Small area of wound dehiscence upon exam with a small amount of yellow drainage at the site.  Spoke with podiatry on-call, Dr. Malvin, who felt the wound looked good and drainage was likely secondary to a seroma from wound packing material.  He recommended discharge without antibiotics and follow-up with podiatry, Dr. Tobie in about 1 week.  Patient discharged with  above recommendations.      Additional history obtained: -Additional history obtained from family -External records from outside source obtained and reviewed including: Chart review including previous notes, labs, imaging, consultation notes including: OP podiatry notes   Lab Tests: -I ordered, reviewed, and interpreted labs.   The pertinent results include:   Labs Reviewed  BASIC METABOLIC PANEL WITH GFR - Abnormal; Notable for the following components:      Result Value   Glucose, Bld 131 (*)    All other components within normal limits  CBC WITH DIFFERENTIAL/PLATELET  Imaging Studies ordered: I ordered imaging studies including: DG Foot Complete Right Result Date: 11/01/2023 IMPRESSION: 1. Redemonstration of amputation of the great toe to the proximal metatarsal shaft. Interval removal of the prior antibiotic beads within the distal surgical bed and interval removal of the prior medial wound VAC. 2. The distal first metatarsal amputation margin appears sharp. No definite radiographic evidence of osteomyelitis.   I independently visualized the following imaging with scope of interpretation limited to determining acute life threatening conditions related to emergency care; findings noted above I agree with the radiologist interpretation If any imaging was obtained with contrast I closely monitored patient for any possible adverse reaction a/w contrast administration in the emergency department   Medicines ordered and prescription drug management: No orders of the defined types were placed in this encounter.   -I have reviewed the patients home medicines and have made adjustments as needed   Consultations Obtained: I requested consultation with the podiatry,  and discussed lab and imaging findings as well as pertinent plan - they recommend: Discharge with 1 week follow-up  Reevaluation: After the interventions noted above, I reevaluated the patient and found that they have  stayed the same  Co morbidities that complicate the patient evaluation  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    Final Clinical Impression(s) / ED Diagnoses Final diagnoses:  Wound dehiscence        Theophilus Pagan, MD 11/01/23 1321    Long, Fonda MATSU, MD 11/11/23 [phone]

## 2023-11-01 NOTE — ED Provider Triage Note (Signed)
 Emergency Medicine Provider Triage Evaluation Note  Amy Garner , a 68 y.o. female  was evaluated in triage.  Pt complains of discharge from the right great toe surgical bed.  She is 2 months status post amputation of the great toe.  Over the past day she has noticed discharge from the incision.  No systemic complaints, no proximal complaints.  Review of Systems  Positive: Discharge Negative: Numbness, weakness, tingling that is new  Physical Exam  BP (!) 144/72 (BP Location: Right Arm)   Pulse 74   Temp 98.7 F (37.1 C)   Resp 19   Ht 5' 6 (1.676 m)   Wt 106.6 kg   SpO2 99%   BMI 37.93 kg/m  Gen:   Awake, no distress speaking clearly adult female Resp:  Normal effort no increased work of breathing MSK:   Moves extremities without difficulty right great toe status post amputation with anterior posterior oriented surgical incision with discharge on the distal aspect.  There is erythema proximal and medial, though this is near the site of her foot where the strap Medical Decision Making  Medically screening exam initiated at 9:05 AM.  Appropriate orders placed.  Amy Garner was informed that the remainder of the evaluation will be completed by another provider, this initial triage assessment does not replace that evaluation, and the importance of remaining in the ED until their evaluation is complete.   Dorenda Gandy, MD 11/01/23 [phone]

## 2023-11-01 NOTE — Telephone Encounter (Signed)
 Left voicemail with patient's daughter phone number, asking her to have the patient check MyChart for follow-up appointment details, as the patient's voicemail is currently full.

## 2023-11-01 NOTE — Discharge Instructions (Addendum)
 You were seen for drainage from your postoperative wound site.  We spoke with the podiatrist on-call who thought this was likely secondary to fluid that commonly occurs after packing the wound as they did during your procedure.  They do not think it is an infection at this time and you do not need antibiotics.  Please continue to do your wound care dressings and follow-up with the podiatry office next week.  Dr. Basilio Both office should also be reaching out to schedule appointment but please call them to coordinate as well.

## 2023-11-01 NOTE — ED Notes (Signed)
 Patient discharged by RN, ambulatory to lobby at time of discharge with no additional questions.

## 2023-11-11 ENCOUNTER — Ambulatory Visit: Admitting: Podiatry

## 2023-11-29 ENCOUNTER — Ambulatory Visit: Attending: Family Medicine | Admitting: Pharmacist

## 2023-11-29 DIAGNOSIS — Z7984 Long term (current) use of oral hypoglycemic drugs: Secondary | ICD-10-CM | POA: Diagnosis not present

## 2023-11-29 DIAGNOSIS — E1169 Type 2 diabetes mellitus with other specified complication: Secondary | ICD-10-CM

## 2023-11-29 DIAGNOSIS — Z794 Long term (current) use of insulin: Secondary | ICD-10-CM | POA: Diagnosis not present

## 2023-11-29 NOTE — Progress Notes (Signed)
 S:     No chief complaint on file.  68 y.o. female who presents for diabetes evaluation, education, and management.  PMH is significant for T2DM, HLD, GERD.  Patient was referred and last seen by Primary Care Provider, Dr. Tanda, on 09/05/2023. At her last visit with pharmacy on 10/28/2023,  Humalog  dose was increased. Metformin  and Toujeo  doses were continued without changes.   Today, patient arrives in good spirits and presents without any assistance. Continues to do well. She follows w/ Podiatry. We to the ED 11/01/2023 after noticing small amount of yellow drainage at her site. XR was negative. Also consulted Poditrist and felt that the drainage was likely due to a seroma from wound packing material. Today, the wound is without drainage. No erythema or swelling. Nothing noticed at home. She has been able to pick up and use her [employer] successfully. Brings her reader in for review today. No issues with changes to insulin .   Family/Social History:  Fhx: MI, asthma Tobacco: former smoker (quit in 2017) Alcohol : none reported   Current diabetes medications include: Toujeo  62 units daily, metformin  500 mg XR (1000mg  daily), Humalog : take 6 units before your two smaller meals and 10 units before your largest meal. Skip if pre-prandial glucose is < 100 mg/dL.   Patient reports adherence to taking all medications as prescribed.  Insurance coverage: [employer]  Patient denies hypoglycemic events.   Patient denies nocturia (nighttime urination).  Patient denies neuropathy (nerve pain). Patient denies visual changes. Patient reports self foot exams.   Patient reported dietary habits:  -Since hospital admission, her daughter is helping her manage her diet. -Protein-shakes, salad  -Snacks: apples and peanut butter  Patient-reported exercise habits:  -None reported  O:  Freestyle Libre 3 report: 30 day report Time active: 72% (had just started using, but 14 day report  shows 97% usage)  Average blood glucose: 130 mg/dL Time above range (>819 mg/dL): 89% Time in range (29-819 mg/dL): 12% Time below range (<70 mg/dL): 3%. Usually occurs in the evening.   Lab Results  Component Value Date   HGBA1C 9.9 (A) 10/28/2023   There were no vitals filed for this visit.  Lipid Panel     Component Value Date/Time   CHOL 150 08/13/2022 1054   TRIG 144 08/13/2022 1054   HDL 42 08/13/2022 1054   CHOLHDL 3.6 08/13/2022 1054   LDLCALC 83 08/13/2022 1054    Clinical Atherosclerotic Cardiovascular Disease (ASCVD): No  The 10-year ASCVD risk score (Arnett DK, et al., 2019) is: 23.6%   Values used to calculate the score:     Age: 45 years     Clincally relevant sex: Female     Is Non-Hispanic African American: No     Diabetic: Yes     Tobacco smoker: No     Systolic Blood Pressure: 140 mmHg     Is BP treated: Yes     HDL Cholesterol: 42 mg/dL     Total Cholesterol: 150 mg/dL   A/P: Diabetes longstanding currently above goal but much improved from 12.5% now down to 9.9% in June. Her A1c goal <7%. CGM shows optimal control. In fact, we will need to continue to have her monitor closely at home to avoid over-treatment and hypoglycemia. She is not currently symptomatic but is able to verbalize appropriate hypoglycemia management plan. Medication adherence appears appropriate. Wound healing nicely with no more infection.  -Continue Toujeo  62 units once daily. -Continue Humalog : take 6 units before  your two smaller meals and 10 units before your largest meal. Skip if pre-prandial glucose is < 100 mg/dL. -Continue metformin  500 mg XR: two tablets (1000mg ) BID.  -Continue utilizing Freestyle Libre sensors.  -Patient educated on purpose, proper use, and potential adverse effects of Humalog  insulin . -Extensively discussed pathophysiology of diabetes, recommended lifestyle interventions, dietary effects on blood sugar control.  -Counseled on s/sx of and management of  hypoglycemia.  -Next A1c anticipated 01/2024.  Written patient instructions provided. Patient verbalized understanding of treatment plan.  Total time in face to face counseling 20 minutes.    Follow-up:  Pharmacist: end of August PCP: 12/05/23 and 01/23/2024  Herlene Fleeta Morris, PharmD, BCACP, CPP Clinical Pharmacist [facility] [phone]

## 2023-11-30 ENCOUNTER — Other Ambulatory Visit: Payer: Self-pay | Admitting: Family Medicine

## 2023-12-05 ENCOUNTER — Encounter: Payer: Self-pay | Admitting: Family Medicine

## 2023-12-05 ENCOUNTER — Ambulatory Visit (INDEPENDENT_AMBULATORY_CARE_PROVIDER_SITE_OTHER): Admitting: Family Medicine

## 2023-12-05 VITALS — BP 133/73 | HR 73 | Ht 66.0 in | Wt 238.0 lb

## 2023-12-05 DIAGNOSIS — Z794 Long term (current) use of insulin: Secondary | ICD-10-CM | POA: Diagnosis not present

## 2023-12-05 DIAGNOSIS — E1169 Type 2 diabetes mellitus with other specified complication: Secondary | ICD-10-CM | POA: Diagnosis not present

## 2023-12-05 DIAGNOSIS — E785 Hyperlipidemia, unspecified: Secondary | ICD-10-CM

## 2023-12-05 DIAGNOSIS — I10 Essential (primary) hypertension: Secondary | ICD-10-CM | POA: Diagnosis not present

## 2023-12-05 NOTE — Progress Notes (Signed)
 Established Patient Office Visit  Subjective    Patient ID: Amy Garner, female    DOB: 03/31/1956  Age: 68 y.o. MRN: [id number]  CC:  Chief Complaint  Patient presents with   Medical Management of Chronic Issues    HPI Amy Garner presents for routine follow up of chronic med issues including diabetes and hypertension. Patient reports med compliance and denies acute complaints.   Outpatient Encounter Medications as of 12/05/2023  Medication Sig   albuterol  (VENTOLIN  HFA) 108 (90 Base) MCG/ACT inhaler Inhale 2 puffs into the lungs every 6 (six) hours as needed for wheezing or shortness of breath.   Alcohol  Swabs  PADS USE TO TAKE BLOOD SUGAR 2 TIMES A DAY   cholecalciferol (VITAMIN D3) 25 MCG (1000 UNIT) tablet Take 1,000 Units by mouth daily.   collagenase  (SANTYL ) 250 UNIT/GM ointment Apply 1 Application topically daily. Wound size is 0.5 cm x 0.3 cm x 0.3   Continuous Glucose Receiver (FREESTYLE LIBRE 3 READER) DEVI Check blood sugar continuously throughout the day. E11.69   Continuous Glucose Sensor (FREESTYLE LIBRE 3 PLUS SENSOR) MISC Check blood sugar continuously throughout the day. Change sensor every 15 days. E11.69   esomeprazole  (NEXIUM ) 40 MG capsule TAKE 1 CAPSULE EVERY MORNING   fluconazole  (DIFLUCAN ) 150 MG tablet Take 150 mg by mouth once.   FLUoxetine  (PROZAC ) 20 MG capsule Take 1 capsule (20 mg total) by mouth daily.   glucose blood test strip Use as instructed   ibuprofen  (ADVIL ) 400 MG tablet Take 400 mg by mouth every 6 (six) hours as needed for moderate pain (pain score 4-6).   insulin  glargine, 1 Unit Dial , (TOUJEO  SOLOSTAR) 300 UNIT/ML Solostar Pen Inject 62 Units into the skin daily.   insulin  lispro (HUMALOG  KWIKPEN) 100 UNIT/ML KwikPen Inject 6 units subcutaneously before breakfast, 6 units before lunch, and 10 units before dinner. Do not use if pre-prandial sugar is under 100.   Insulin  Pen Needle (PEN NEEDLES) 32G X 4 MM MISC Use to inject  insulin  once daily.   losartan  (COZAAR ) 25 MG tablet TAKE 1 TABLET EVERY DAY   lovastatin  (MEVACOR ) 10 MG tablet TAKE 1 TABLET AT BEDTIME   meclizine  (ANTIVERT ) 25 MG tablet TAKE 1 TABLET (25 MG TOTAL) BY MOUTH 3 (THREE) TIMES DAILY AS NEEDED FOR DIZZINESS.   metFORMIN  (GLUCOPHAGE -XR) 500 MG 24 hr tablet TAKE 2 TABLETS twice daily with meals.   Misc. Devices MISC TRUE MATRIX METER   oxyCODONE -acetaminophen  (PERCOCET) 5-325 MG tablet Take 1 tablet by mouth every 4 (four) hours as needed for severe pain (pain score 7-10).   triamterene -hydrochlorothiazide  (DYAZIDE ) 37.5-25 MG capsule TAKE 1 CAPSULE EVERY DAY   TRUEplus Lancets 33G MISC USE TO TAKE BLOOD SUGAR 2 TIMES A DAY   No facility-administered encounter medications on file as of 12/05/2023.    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    Peptic ulcer    Pneumonia    Type 2 diabetes mellitus (HCC)     Past Surgical History:  Procedure Laterality Date   AMPUTATION Right 08/22/2023   Procedure: AMPUTATION, FOOT, RAY;  Surgeon: Malvin,  Marsa FALCON, DPM;  Location: [facility];  Service: Orthopedics/Podiatry;  Laterality: Right;   APPENDECTOMY  1990   APPLICATION OF WOUND VAC Right 08/22/2023   Procedure: APPLICATION, WOUND VAC;  Surgeon: Malvin Marsa FALCON, DPM;  Location: [facility];  Service: Orthopedics/Podiatry;  Laterality: Right;   BREAST BIOPSY     BUNIONECTOMY Right    CAPSULOTOMY METATARSOPHALANGEAL Right 02/07/2023   Procedure: CAPSULOTOMY METATARSOPHALANGEAL SECOND AND THIRD;  Surgeon: Tobie Franky SQUIBB, DPM;  Location: [facility];  Service: Podiatry;  Laterality: Right;   CERVICAL DISC SURGERY     CHOLECYSTECTOMY      COLONOSCOPY     DIAGNOSTIC LAPAROSCOPY  1980   endometriosis   HALLUX FUSION Right 02/07/2023   Procedure: HALLUX FUSION METATARSAL PHALANGEAL JOINT;  Surgeon: Tobie Franky SQUIBB, DPM;  Location: [facility];  Service: Podiatry;  Laterality: Right;  POPLITEAL BLOCK   HAMMER TOE SURGERY Right 02/07/2023   Procedure: HAMMER TOE CORRECTION SECOND AND THIRD;  Surgeon: Tobie Franky SQUIBB, DPM;  Location: [facility];  Service: Podiatry;  Laterality: Right;   HERNIA REPAIR  Inguinal right & left repair.   INGUINAL HERNIA REPAIR Left 05/02/2020   Procedure: OPEN LEFT INGUINAL HERNIA REPAIR WITH MESH, TAP BLOCK;  Surgeon: Signe Mitzie LABOR, MD;  Location: [facility];  Service: General;  Laterality: Left;   IRRIGATION AND DEBRIDEMENT FOOT Right 08/24/2023   Procedure: IRRIGATION AND DEBRIDEMENT FOOT;  Surgeon: Malvin Marsa FALCON, DPM;  Location: [facility];  Service: Orthopedics/Podiatry;  Laterality: Right;  Irrigation and Debridement Right Foot Wound with application of skin graft, Antibiotic Beads, and Delayed Closure of Wound   TUBAL LIGATION  1990s    Family History  Problem Relation Age of Onset   Heart attack Mother    Asthma Mother    Heart disease Father    Hypertension Father    Stomach cancer Paternal Aunt    Colon cancer Paternal Aunt    Varicose Veins Maternal Grandmother    Diabetes Paternal Grandmother    ADD / ADHD Daughter    ADD / ADHD Son     Social History   Socioeconomic History   Marital status: Widowed    Spouse name: Not on file   Number of children: Not on file   Years of education: Not on file   Highest education level: Some college, no degree  Occupational History   Not on file  Tobacco Use   Smoking status: Former    Current packs/day: 0.00    Average packs/day: 1.1 packs/day for 20.0 years (22.5 ttl pk-yrs)    Types: Cigarettes    Start date: 2002    Quit date: 2017    Years since quitting: 8.5   Smokeless tobacco: Never  Vaping Use   Vaping status: Never Used   Substance and Sexual Activity   Alcohol  use: Never   Drug use: Never   Sexual activity: Yes    Birth control/protection: Surgical  Other Topics Concern   Not on file  Social History Narrative   Lives alone   Social Drivers of Health   Financial Resource Strain: Medium Risk (11/29/2023)   Overall Financial Resource Strain (CARDIA)    Difficulty of Paying Living Expenses: Somewhat hard  Food Insecurity: Food Insecurity Present (11/29/2023)   Hunger Vital Sign    Worried About Running Out of Food in the Last Year: Sometimes true    Ran Out of Food in the Last Year: Sometimes true  Transportation Needs: No Transportation Needs (11/29/2023)  PRAPARE - Administrator, Civil Service (Medical): No    Lack of Transportation (Non-Medical): No  Physical Activity: Insufficiently Active (11/29/2023)   Exercise Vital Sign    Days of Exercise per Week: 2 days    Minutes of Exercise per Session: 60 min  Stress: No Stress Concern Present (11/29/2023)   [employer] of Occupational Health - Occupational Stress Questionnaire    Feeling of Stress: Not at all  Social Connections: Socially Isolated (11/29/2023)   Social Connection and Isolation Panel    Frequency of Communication with Friends and Family: More than three times a week    Frequency of Social Gatherings with Friends and Family: Three times a week    Attends Religious Services: Never    Active Member of Clubs or Organizations: No    Attends Banker Meetings: Not on file    Marital Status: Widowed  Intimate Partner Violence: Not At Risk (08/26/2023)   Humiliation, Afraid, Rape, and Kick questionnaire    Fear of Current or Ex-Partner: No    Emotionally Abused: No    Physically Abused: No    Sexually Abused: No    Review of Systems  All other systems reviewed and are negative.       Objective    BP 133/73   Pulse 73   Ht 5' 6 (1.676 m)   Wt 238 lb (108 kg)   SpO2 95%   BMI 38.41 kg/m    Physical Exam Vitals and nursing note reviewed.  Constitutional:      General: She is not in acute distress. Cardiovascular:     Rate and Rhythm: Normal rate and regular rhythm.  Pulmonary:     Effort: Pulmonary effort is normal.     Breath sounds: Normal breath sounds.  Abdominal:     Palpations: Abdomen is soft.     Tenderness: There is no abdominal tenderness.  Neurological:     General: No focal deficit present.     Mental Status: She is alert and oriented to person, place, and time.  Psychiatric:        Mood and Affect: Mood and affect normal.        Speech: Speech normal.        Behavior: Behavior normal.         Assessment & Plan:   1. Type 2 diabetes mellitus with other specified complication, without long-term current use of insulin  (HCC) (Primary) Recent A1c shows improvement but still above goal. Continue   2. Essential hypertension Appears stable. Continue   3. Hyperlipidemia, unspecified hyperlipidemia type Continue   No follow-ups on file.   Tanda Raguel SQUIBB, MD

## 2023-12-07 ENCOUNTER — Ambulatory Visit: Admitting: Podiatry

## 2023-12-07 DIAGNOSIS — Z89421 Acquired absence of other right toe(s): Secondary | ICD-10-CM | POA: Diagnosis not present

## 2023-12-07 NOTE — Progress Notes (Signed)
 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 female returns for post-op check.  Patient states she is doing okay denies any other acute complaints.  Patient states that she is doing well she has been doing Betadine wet-to-dry dressing denies any other acute complaints Review of Systems: Negative except as noted in the HPI. Denies N/V/F/Ch.  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 Wound size is 0.5 cm x 0.3 cm x 0.3, Disp: 30 g, Rfl: 0   Continuous Glucose Receiver (FREESTYLE LIBRE 3 READER) DEVI, Check blood sugar continuously throughout the day. E11.69, Disp: 1 each, Rfl: 0   Continuous Glucose Sensor (FREESTYLE LIBRE 3 PLUS SENSOR) MISC, Check blood sugar continuously throughout the day. Change sensor every 15 days. E11.69, Disp: 2 each, Rfl: 6    esomeprazole  (NEXIUM ) 40 MG capsule, TAKE 1 CAPSULE EVERY MORNING, Disp: 90 capsule, Rfl: 3   fluconazole  (DIFLUCAN ) 150 MG tablet, Take 150 mg by mouth once., Disp: , Rfl:    FLUoxetine  (PROZAC ) 20 MG capsule, Take 1 capsule (20 mg total) by mouth daily., Disp: 90 capsule, Rfl: 1   glucose blood test strip, Use as instructed, Disp: 100 each, Rfl: 5   ibuprofen  (ADVIL ) 400 MG tablet, Take 400 mg by mouth every 6 (six) hours as needed for moderate pain (pain score 4-6)., Disp: , Rfl:    insulin  glargine, 1 Unit Dial , (TOUJEO  SOLOSTAR) 300 UNIT/ML Solostar Pen, Inject 62 Units into the skin daily., Disp: 4.5 mL, Rfl: 3   insulin  lispro (HUMALOG  KWIKPEN) 100 UNIT/ML KwikPen, Inject 6 units subcutaneously before breakfast, 6 units before lunch, and 10 units before dinner. Do not use if pre-prandial sugar is under 100., Disp: 15 mL, Rfl: 1   Insulin  Pen Needle (PEN NEEDLES) 32G X 4 MM MISC, Use to inject insulin  once daily., Disp: 100 each, Rfl: 3   losartan  (COZAAR ) 25 MG tablet, TAKE 1 TABLET EVERY DAY, Disp: 90 tablet, Rfl: 3   lovastatin  (MEVACOR ) 10 MG tablet, TAKE 1 TABLET AT BEDTIME, Disp: 90 tablet, Rfl: 3   meclizine  (ANTIVERT ) 25 MG tablet, TAKE 1 TABLET (25 MG TOTAL) BY MOUTH 3 (THREE) TIMES DAILY AS  NEEDED FOR DIZZINESS., Disp: 30 tablet, Rfl: 0   metFORMIN  (GLUCOPHAGE -XR) 500 MG 24 hr tablet, TAKE 2 TABLETS twice daily with meals., Disp: 360 tablet, Rfl: 2   Misc. Devices MISC, TRUE MATRIX METER, Disp: 1 each, Rfl: 0   oxyCODONE -acetaminophen  (PERCOCET) 5-325 MG tablet, Take 1 tablet by mouth every 4 (four) hours as needed for severe pain (pain score 7-10)., Disp: 30 tablet, Rfl: 0   triamterene -hydrochlorothiazide  (DYAZIDE ) 37.5-25 MG capsule, TAKE 1 CAPSULE EVERY DAY, Disp: 90 capsule, Rfl: 3   TRUEplus Lancets 33G MISC, USE TO TAKE BLOOD SUGAR 2 TIMES A DAY, Disp: 100 each, Rfl: 2  Social History   Tobacco Use  Smoking Status Former   Current packs/day: 0.00   Average packs/day: 1.1  packs/day for 20.0 years (22.5 ttl pk-yrs)   Types: Cigarettes   Start date: 2002   Quit date: 2017   Years since quitting: 8.5  Smokeless Tobacco Never    Allergies  Allergen Reactions   Atenolol Anaphylaxis   Codeine Other (See Comments)    Pt says she gets crazy   Objective:  There were no vitals filed for this visit. There is no height or weight on file to calculate BMI. Constitutional Well developed. Well nourished.  Vascular Foot warm and well perfused. Capillary refill normal to all digits.   Neurologic Normal speech. Oriented to person, place, and time. Epicritic sensation to light touch grossly present bilaterally.  Dermatologic Skin completely reepithelialized.  No signs of dehiscence noted no further breakdown of skin noted.  Skin has healed completely  Orthopedic: No further 6 tenderness to palpation noted about the surgical site.   Radiographs: None Assessment:  No diagnosis found. Plan:  Patient was evaluated and treated and all questions answered.  S/p foot surgery right - Clinically healed and officially discharge from my care if any foot and ankle issues are future she will come back and see me.  No further signs of ulceration noted.  She states understanding. No follow-ups on file.

## 2023-12-30 ENCOUNTER — Other Ambulatory Visit: Payer: Self-pay | Admitting: Family Medicine

## 2024-01-10 ENCOUNTER — Ambulatory Visit: Attending: Family Medicine | Admitting: Pharmacist

## 2024-01-10 ENCOUNTER — Encounter: Payer: Self-pay | Admitting: Pharmacist

## 2024-01-10 DIAGNOSIS — E1169 Type 2 diabetes mellitus with other specified complication: Secondary | ICD-10-CM | POA: Diagnosis not present

## 2024-01-10 DIAGNOSIS — Z794 Long term (current) use of insulin: Secondary | ICD-10-CM

## 2024-01-10 DIAGNOSIS — Z7984 Long term (current) use of oral hypoglycemic drugs: Secondary | ICD-10-CM | POA: Diagnosis not present

## 2024-01-10 NOTE — Progress Notes (Signed)
 S:     No chief complaint on file.  68 y.o. female who presents for diabetes evaluation, education, and management.  PMH is significant for T2DM, HLD, GERD.  Patient was referred and last seen by Primary Care Provider, Dr. Tanda, on 12/05/2023. At her last visit with pharmacy on 11/29/23, medication doses were continued without changes.   Today, patient arrives in good spirits and presents without any assistance. Continues to do well. Podiatry saw her on 12/07/2023 and was discharged with healed right foot S/p right partial ray amputation. Regarding her DM, she has been able to use her [employer]. Brings her reader in for review today. No issues with her insulin . She does note some hypoglycemia in the mornings, fasting.   Family/Social History:  Fhx: MI, asthma Tobacco: former smoker (quit in 2017) Alcohol : none reported   Current diabetes medications include: Toujeo  62 units daily, metformin  500 mg XR (1000mg  daily), Humalog : take 6 units before your two smaller meals and 10 units before your largest meal. Skip if pre-prandial glucose is < 100 mg/dL.   Patient reports adherence to taking all medications as prescribed.  Insurance coverage: [employer]  Patient reports hypoglycemic events. Gives several readings of 69 mg/dL over the last couple of weeks. Treated successfully.    Patient denies nocturia (nighttime urination).  Patient denies neuropathy (nerve pain). Patient denies visual changes. Patient reports self foot exams.   Patient reported dietary habits:  -Since hospital admission, her daughter is helping her manage her diet. -Protein-shakes, salad  -Snacks: apples and peanut butter  Patient-reported exercise habits:  -None reported  O:  Freestyle Libre 3 report: 30 day report Time active: 100%  Average blood glucose: 162 mg/dL Time above range (>819 mg/dL): 68% Time in range (29-819 mg/dL): 30%  Lab Results  Component Value Date   HGBA1C 9.9 (A)  10/28/2023   There were no vitals filed for this visit.  Lipid Panel     Component Value Date/Time   CHOL 150 08/13/2022 1054   TRIG 144 08/13/2022 1054   HDL 42 08/13/2022 1054   CHOLHDL 3.6 08/13/2022 1054   LDLCALC 83 08/13/2022 1054    Clinical Atherosclerotic Cardiovascular Disease (ASCVD): No  The 10-year ASCVD risk score (Arnett DK, et al., 2019) is: 21.6%   Values used to calculate the score:     Age: 43 years     Clincally relevant sex: Female     Is Non-Hispanic African American: No     Diabetic: Yes     Tobacco smoker: No     Systolic Blood Pressure: 133 mmHg     Is BP treated: Yes     HDL Cholesterol: 42 mg/dL     Total Cholesterol: 150 mg/dL   A/P: Diabetes longstanding currently above goal but much improved from 12.5% now down to 9.9% in June. Her A1c goal <7%. CGM shows okay control. Her avgs have increased slightly since her last visit with me and are driven, primarily, by post-prandial hyperglycemia after dinner. She is having some hypoglycemia first thing in the morning. She is not currently symptomatic but is able to verbalize appropriate hypoglycemia management plan. Medication adherence appears appropriate. -Continue Toujeo  62 units once daily. -Increase Humalog : take 6 units before your two smaller meals and 14 units before your largest meal. Skip if pre-prandial glucose is < 100 mg/dL. -Continue metformin  500 mg XR: two tablets (1000mg ) BID.  -Continue utilizing Freestyle Libre sensors.  -Patient educated on purpose, proper use, and  potential adverse effects of Humalog  insulin . -Extensively discussed pathophysiology of diabetes, recommended lifestyle interventions, dietary effects on blood sugar control.  -Counseled on s/sx of and management of hypoglycemia.  -Next A1c anticipated 01/2024.  Written patient instructions provided. Patient verbalized understanding of treatment plan.  Total time in face to face counseling 20 minutes.    Follow-up:   Pharmacist: 4-6 weeks PCP: 01/23/2024  Herlene Fleeta Morris, PharmD, BCACP, CPP Clinical Pharmacist [facility] [phone]

## 2024-01-13 ENCOUNTER — Other Ambulatory Visit: Payer: Self-pay | Admitting: Family Medicine

## 2024-01-13 DIAGNOSIS — Z794 Long term (current) use of insulin: Secondary | ICD-10-CM

## 2024-01-23 ENCOUNTER — Ambulatory Visit (INDEPENDENT_AMBULATORY_CARE_PROVIDER_SITE_OTHER): Admitting: Family Medicine

## 2024-01-23 ENCOUNTER — Encounter: Payer: Self-pay | Admitting: Family Medicine

## 2024-01-23 VITALS — BP 155/69 | HR 76 | Ht 66.0 in | Wt 242.4 lb

## 2024-01-23 DIAGNOSIS — Z794 Long term (current) use of insulin: Secondary | ICD-10-CM | POA: Diagnosis not present

## 2024-01-23 DIAGNOSIS — Z130 Encounter for screening for diseases of the blood and blood-forming organs and certain disorders involving the immune mechanism: Secondary | ICD-10-CM | POA: Diagnosis not present

## 2024-01-23 DIAGNOSIS — Z0000 Encounter for general adult medical examination without abnormal findings: Secondary | ICD-10-CM

## 2024-01-23 DIAGNOSIS — Z79899 Other long term (current) drug therapy: Secondary | ICD-10-CM | POA: Diagnosis not present

## 2024-01-23 DIAGNOSIS — Z13220 Encounter for screening for lipoid disorders: Secondary | ICD-10-CM

## 2024-01-23 DIAGNOSIS — Z122 Encounter for screening for malignant neoplasm of respiratory organs: Secondary | ICD-10-CM

## 2024-01-23 DIAGNOSIS — Z13820 Encounter for screening for osteoporosis: Secondary | ICD-10-CM

## 2024-01-23 DIAGNOSIS — Z89431 Acquired absence of right foot: Secondary | ICD-10-CM | POA: Diagnosis not present

## 2024-01-23 DIAGNOSIS — E1169 Type 2 diabetes mellitus with other specified complication: Secondary | ICD-10-CM | POA: Diagnosis not present

## 2024-01-23 DIAGNOSIS — Z7984 Long term (current) use of oral hypoglycemic drugs: Secondary | ICD-10-CM | POA: Diagnosis not present

## 2024-01-23 DIAGNOSIS — Z13228 Encounter for screening for other metabolic disorders: Secondary | ICD-10-CM | POA: Diagnosis not present

## 2024-01-23 DIAGNOSIS — Z780 Asymptomatic menopausal state: Secondary | ICD-10-CM | POA: Diagnosis not present

## 2024-01-23 DIAGNOSIS — Z1159 Encounter for screening for other viral diseases: Secondary | ICD-10-CM | POA: Diagnosis not present

## 2024-01-23 DIAGNOSIS — Z1329 Encounter for screening for other suspected endocrine disorder: Secondary | ICD-10-CM | POA: Diagnosis not present

## 2024-01-23 LAB — POCT GLYCOSYLATED HEMOGLOBIN (HGB A1C): HbA1c, POC (controlled diabetic range): 7.3 % — AB (ref 0.0–7.0)

## 2024-01-23 NOTE — Progress Notes (Signed)
 Established Patient Office Visit  Subjective    Patient ID: Amy Garner, female    DOB: 1956-03-07  Age: 68 y.o. MRN: [id number]  CC:  Chief Complaint  Patient presents with   Annual Exam    HPI Amy Garner presents for routine annual exam. Patient reports she would like to lose weight. Patient denies acute complaints.   Outpatient Encounter Medications as of 01/23/2024  Medication Sig   albuterol  (VENTOLIN  HFA) 108 (90 Base) MCG/ACT inhaler Inhale 2 puffs into the lungs every 6 (six) hours as needed for wheezing or shortness of breath.   Alcohol  Swabs  PADS USE TO TAKE BLOOD SUGAR 2 TIMES A DAY   cholecalciferol (VITAMIN D3) 25 MCG (1000 UNIT) tablet Take 1,000 Units by mouth daily.   Continuous Glucose Receiver (FREESTYLE LIBRE 3 READER) DEVI Check blood sugar continuously throughout the day. E11.69   Continuous Glucose Sensor (FREESTYLE LIBRE 3 PLUS SENSOR) MISC Check blood sugar continuously throughout the day. Change sensor every 15 days. E11.69   esomeprazole  (NEXIUM ) 40 MG capsule TAKE 1 CAPSULE EVERY MORNING   FLUoxetine  (PROZAC ) 20 MG capsule Take 1 capsule (20 mg total) by mouth daily.   glucose blood test strip Use as instructed   ibuprofen  (ADVIL ) 400 MG tablet Take 400 mg by mouth every 6 (six) hours as needed for moderate pain (pain score 4-6).   insulin  glargine, 1 Unit Dial , (TOUJEO  SOLOSTAR) 300 UNIT/ML Solostar Pen INJECT 62 UNITS INTO THE SKIN DAILY.   insulin  lispro (HUMALOG  KWIKPEN) 100 UNIT/ML KwikPen INJECT 6 UNITS BEFORE BREAKFAST AND LUNCH, AND 10 UNITS BEFORE DINNER. DO NOT USE IF PRE-PRANDIAL SUGAR IS UNDER 100   Insulin  Pen Needle (PEN NEEDLES) 32G X 4 MM MISC Use to inject insulin  once daily.   losartan  (COZAAR ) 25 MG tablet TAKE 1 TABLET EVERY DAY   lovastatin  (MEVACOR ) 10 MG tablet TAKE 1 TABLET AT BEDTIME   meclizine  (ANTIVERT ) 25 MG tablet TAKE 1 TABLET THREE TIMES DAILY AS NEEDED FOR DIZZINESS   metFORMIN  (GLUCOPHAGE -XR) 500 MG 24 hr  tablet TAKE 2 TABLETS twice daily with meals.   Misc. Devices MISC TRUE MATRIX METER   triamterene -hydrochlorothiazide  (DYAZIDE ) 37.5-25 MG capsule TAKE 1 CAPSULE EVERY DAY   TRUEplus Lancets 33G MISC USE TO TAKE BLOOD SUGAR 2 TIMES A DAY   fluconazole  (DIFLUCAN ) 150 MG tablet Take 150 mg by mouth once.   No facility-administered encounter medications on file as of 01/23/2024.    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    Peptic ulcer    Pneumonia    Type 2 diabetes mellitus (HCC)     Past Surgical History:  Procedure Laterality Date   AMPUTATION Right 08/22/2023   Procedure: AMPUTATION, FOOT, RAY;  Surgeon: Malvin Marsa FALCON, DPM;  Location: [facility];  Service: Orthopedics/Podiatry;  Laterality: Right;   APPENDECTOMY  1990   APPLICATION OF WOUND VAC Right 08/22/2023   Procedure: APPLICATION, WOUND VAC;  Surgeon: Malvin Marsa FALCON, DPM;  Location: [facility];  Service: Orthopedics/Podiatry;  Laterality: Right;   BREAST BIOPSY     BUNIONECTOMY Right    CAPSULOTOMY  METATARSOPHALANGEAL Right 02/07/2023   Procedure: CAPSULOTOMY METATARSOPHALANGEAL SECOND AND THIRD;  Surgeon: Tobie Franky SQUIBB, DPM;  Location: [facility];  Service: Podiatry;  Laterality: Right;   CERVICAL DISC SURGERY     CHOLECYSTECTOMY     COLONOSCOPY     DIAGNOSTIC LAPAROSCOPY  1980   endometriosis   HALLUX FUSION Right 02/07/2023   Procedure: HALLUX FUSION METATARSAL PHALANGEAL JOINT;  Surgeon: Tobie Franky SQUIBB, DPM;  Location: [facility];  Service: Podiatry;  Laterality: Right;  POPLITEAL BLOCK   HAMMER TOE SURGERY Right 02/07/2023   Procedure: HAMMER TOE CORRECTION SECOND AND THIRD;  Surgeon: Tobie Franky SQUIBB,  DPM;  Location: [facility];  Service: Podiatry;  Laterality: Right;   HERNIA REPAIR  Inguinal right & left repair.   INGUINAL HERNIA REPAIR Left 05/02/2020   Procedure: OPEN LEFT INGUINAL HERNIA REPAIR WITH MESH, TAP BLOCK;  Surgeon: Signe Mitzie LABOR, MD;  Location: [facility];  Service: General;  Laterality: Left;   IRRIGATION AND DEBRIDEMENT FOOT Right 08/24/2023   Procedure: IRRIGATION AND DEBRIDEMENT FOOT;  Surgeon: Malvin Marsa FALCON, DPM;  Location: [facility];  Service: Orthopedics/Podiatry;  Laterality: Right;  Irrigation and Debridement Right Foot Wound with application of skin graft, Antibiotic Beads, and Delayed Closure of Wound   TUBAL LIGATION  1990s    Family History  Problem Relation Age of Onset   Heart attack Mother    Asthma Mother    Heart disease Father    Hypertension Father    Stomach cancer Paternal Aunt    Colon cancer Paternal Aunt    Varicose Veins Maternal Grandmother    Diabetes Paternal Grandmother    ADD / ADHD Daughter    ADD / ADHD Son     Social History   Socioeconomic History   Marital status: Widowed    Spouse name: Not on file   Number of children: Not on file   Years of education: Not on file   Highest education level: Some college, no degree  Occupational History   Not on file  Tobacco Use   Smoking status: Former    Current packs/day: 0.00    Average packs/day: 1.1 packs/day for 20.0 years (22.5 ttl pk-yrs)    Types: Cigarettes    Start date: 2002    Quit date: 2017    Years since quitting: 8.6   Smokeless tobacco: Never  Vaping Use   Vaping status: Never Used  Substance and Sexual Activity   Alcohol  use: Never   Drug use: Never   Sexual activity: Yes    Birth control/protection: Surgical  Other Topics Concern   Not on file  Social History Narrative   Lives alone   Social Drivers of Health   Financial Resource Strain: Medium Risk (11/29/2023)   Overall Financial Resource Strain (CARDIA)    Difficulty of Paying Living Expenses:  Somewhat hard  Food Insecurity: Food Insecurity Present (11/29/2023)   Hunger Vital Sign    Worried About Running Out of Food in the Last Year: Sometimes true    Ran Out of Food in the Last Year: Sometimes true  Transportation Needs: No Transportation Needs (11/29/2023)   PRAPARE - Administrator, Civil Service (Medical): No    Lack of Transportation (Non-Medical): No  Physical Activity: Insufficiently Active (11/29/2023)   Exercise Vital Sign    Days of Exercise per Week: 2 days    Minutes of Exercise per Session: 60 min  Stress: No Stress Concern Present (11/29/2023)   [employer] of  Occupational Health - Occupational Stress Questionnaire    Feeling of Stress: Not at all  Social Connections: Socially Isolated (11/29/2023)   Social Connection and Isolation Panel    Frequency of Communication with Friends and Family: More than three times a week    Frequency of Social Gatherings with Friends and Family: Three times a week    Attends Religious Services: Never    Active Member of Clubs or Organizations: No    Attends Banker Meetings: Not on file    Marital Status: Widowed  Intimate Partner Violence: Not At Risk (08/26/2023)   Humiliation, Afraid, Rape, and Kick questionnaire    Fear of Current or Ex-Partner: No    Emotionally Abused: No    Physically Abused: No    Sexually Abused: No    Review of Systems  All other systems reviewed and are negative.       Objective    BP (!) 155/69   Pulse 76   Ht 5' 6 (1.676 m)   Wt 242 lb 6.4 oz (110 kg)   SpO2 95%   BMI 39.12 kg/m   Physical Exam Vitals and nursing note reviewed.  Constitutional:      General: She is not in acute distress.    Appearance: She is obese.  HENT:     Head: Normocephalic and atraumatic.     Right Ear: Tympanic membrane, ear canal and external ear normal.     Left Ear: Tympanic membrane, ear canal and external ear normal.     Nose: Nose normal.     Mouth/Throat:      Mouth: Mucous membranes are moist.     Pharynx: Oropharynx is clear.  Eyes:     Conjunctiva/sclera: Conjunctivae normal.     Pupils: Pupils are equal, round, and reactive to light.  Neck:     Thyroid: No thyromegaly.  Cardiovascular:     Rate and Rhythm: Normal rate and regular rhythm.     Heart sounds: Normal heart sounds. No murmur heard. Pulmonary:     Effort: Pulmonary effort is normal. No respiratory distress.     Breath sounds: Normal breath sounds.  Abdominal:     General: There is no distension.     Palpations: Abdomen is soft. There is no mass.     Tenderness: There is no abdominal tenderness.  Musculoskeletal:        General: Normal range of motion.     Cervical back: Normal range of motion and neck supple.  Skin:    General: Skin is warm and dry.  Neurological:     General: No focal deficit present.     Mental Status: She is alert and oriented to person, place, and time.  Psychiatric:        Mood and Affect: Mood normal.        Behavior: Behavior normal.         Assessment & Plan:   Annual physical exam -     Comprehensive metabolic panel with GFR  Screening for deficiency anemia -     CBC with Differential/Platelet  Screening for lipid disorders -     Lipid panel  Need for hepatitis C screening test -     Hepatitis C antibody  Encounter for osteoporosis screening in asymptomatic postmenopausal patient -     DG Bone Density; Future  Screening for lung cancer -     CT CHEST LUNG CANCER SCREENING LOW DOSE WO CONTRAST; Future  Screening for endocrine/metabolic/immunity disorders -  VITAMIN D  25 Hydroxy (Vit-D Deficiency, Fractures)  Type 2 diabetes mellitus with other specified complication, without long-term current use of insulin  (HCC) -     POCT glycosylated hemoglobin (Hb A1C)     No follow-ups on file.   Tanda Raguel SQUIBB, MD

## 2024-01-24 ENCOUNTER — Ambulatory Visit: Payer: Self-pay | Admitting: Family Medicine

## 2024-01-24 LAB — LIPID PANEL
Chol/HDL Ratio: 4 ratio (ref 0.0–4.4)
Cholesterol, Total: 156 mg/dL (ref 100–199)
HDL: 39 mg/dL — ABNORMAL LOW (ref >39)
LDL Chol Calc (NIH): 87 mg/dL (ref 0–99)
Triglycerides: 174 mg/dL — ABNORMAL HIGH (ref 0–149)
VLDL Cholesterol Cal: 30 mg/dL (ref 5–40)

## 2024-01-24 LAB — CBC WITH DIFFERENTIAL/PLATELET
Basophils Absolute: 0.1 x10E3/uL (ref 0.0–0.2)
Basos: 1 %
EOS (ABSOLUTE): 0.2 x10E3/uL (ref 0.0–0.4)
Eos: 3 %
Hematocrit: 37.1 % (ref 34.0–46.6)
Hemoglobin: 11.7 g/dL (ref 11.1–15.9)
Immature Grans (Abs): 0 x10E3/uL (ref 0.0–0.1)
Immature Granulocytes: 0 %
Lymphocytes Absolute: 2 x10E3/uL (ref 0.7–3.1)
Lymphs: 28 %
MCH: 27.3 pg (ref 26.6–33.0)
MCHC: 31.5 g/dL (ref 31.5–35.7)
MCV: 87 fL (ref 79–97)
Monocytes Absolute: 0.5 x10E3/uL (ref 0.1–0.9)
Monocytes: 7 %
Neutrophils Absolute: 4.5 x10E3/uL (ref 1.4–7.0)
Neutrophils: 61 %
Platelets: 170 x10E3/uL (ref 150–450)
RBC: 4.28 x10E6/uL (ref 3.77–5.28)
RDW: 13.3 % (ref 11.7–15.4)
WBC: 7.3 x10E3/uL (ref 3.4–10.8)

## 2024-01-24 LAB — COMPREHENSIVE METABOLIC PANEL WITH GFR
ALT: 21 IU/L (ref 0–32)
AST: 21 IU/L (ref 0–40)
Albumin: 4.1 g/dL (ref 3.9–4.9)
Alkaline Phosphatase: 98 IU/L (ref 44–121)
BUN/Creatinine Ratio: 25 (ref 12–28)
BUN: 20 mg/dL (ref 8–27)
Bilirubin Total: 0.3 mg/dL (ref 0.0–1.2)
CO2: 23 mmol/L (ref 20–29)
Calcium: 9.4 mg/dL (ref 8.7–10.3)
Chloride: 100 mmol/L (ref 96–106)
Creatinine, Ser: 0.8 mg/dL (ref 0.57–1.00)
Globulin, Total: 2.6 g/dL (ref 1.5–4.5)
Glucose: 237 mg/dL — ABNORMAL HIGH (ref 70–99)
Potassium: 4.4 mmol/L (ref 3.5–5.2)
Sodium: 138 mmol/L (ref 134–144)
Total Protein: 6.7 g/dL (ref 6.0–8.5)
eGFR: 80 mL/min/1.73 (ref >59)

## 2024-01-24 LAB — VITAMIN D 25 HYDROXY (VIT D DEFICIENCY, FRACTURES): Vit D, 25-Hydroxy: 77 ng/mL (ref 30.0–100.0)

## 2024-01-24 LAB — HEPATITIS C ANTIBODY: Hep C Virus Ab: NONREACTIVE

## 2024-02-12 ENCOUNTER — Other Ambulatory Visit: Payer: Self-pay | Admitting: Family Medicine

## 2024-02-13 NOTE — Telephone Encounter (Signed)
Pt has appt on 10/21.

## 2024-02-23 ENCOUNTER — Ambulatory Visit: Attending: Family Medicine | Admitting: Pharmacist

## 2024-02-23 ENCOUNTER — Encounter: Payer: Self-pay | Admitting: Pharmacist

## 2024-02-23 DIAGNOSIS — Z794 Long term (current) use of insulin: Secondary | ICD-10-CM

## 2024-02-23 DIAGNOSIS — Z7984 Long term (current) use of oral hypoglycemic drugs: Secondary | ICD-10-CM | POA: Diagnosis not present

## 2024-02-23 DIAGNOSIS — E1169 Type 2 diabetes mellitus with other specified complication: Secondary | ICD-10-CM

## 2024-02-23 MED ORDER — LOSARTAN POTASSIUM 25 MG PO TABS: 25.0000 mg | ORAL_TABLET | Freq: Every day | ORAL | 3 refills | Status: DC

## 2024-02-23 MED ORDER — TIRZEPATIDE 2.5 MG/0.5ML ~~LOC~~ SOAJ: 2.5000 mg | SUBCUTANEOUS | 0 refills | Status: DC

## 2024-02-23 MED ORDER — METFORMIN HCL ER 500 MG PO TB24: ORAL_TABLET | ORAL | 2 refills | Status: DC

## 2024-02-23 NOTE — Progress Notes (Signed)
 S:     No chief complaint on file.  68 y.o. female who presents for diabetes evaluation, education, and management. PMH is significant for T2DM, HLD, GERD.   Patient was referred and last seen by Primary Care Provider, Dr. Tanda, on 12/05/2023. At her last visit with pharmacy on 11/29/23, medication doses were continued without changes. Patient has been continuously followed by pharmacy. Last visit on 01/10/24. Humalog  was increased to 14 units with larger meals. No other changes were made at this time.Patient saw Dr. Tanda on 01/23/24. A1c taken that day resulted at 7.3%. No medication changes were made at this time.  Today, patient reports to clinic for follow-up. She reports doing well. Denies any side effects from insulins. No s/sx of hypo or hyper glycemia. She states that she would like start to lose weight. Discussed lifestyle modifications in addition to trying Mounjaro.  Family/Social History:  Fhx: MI, asthma Tobacco: former smoker (quit in 2017) Alcohol : none reported   Current diabetes medications include: Toujeo  62 units daily, metformin  500 mg XR (1000mg  daily), Humalog : take 6 units before your two smaller meals and 14 units before your largest meal. Skip if pre-prandial glucose is < 100 mg/dL.   Patient reports adherence to taking all medications as prescribed.  Insurance coverage: [employer]  Patient reports hypoglycemic events. States that she had a low BG around 40s one night. She ate peanut butter which brought her sugars up. Denied any side effects. Discussed pressure lows and how to always double check BG if this happens.  Patient denies nocturia (nighttime urination).  Patient denies neuropathy (nerve pain). Patient denies visual changes. Patient reports self foot exams.   Patient reported dietary habits:  -Since hospital admission, her daughter is helping her manage her diet. -Protein-shakes, salad  -Snacks: apples and peanut butter  Patient-reported  exercise habits: starting to increase exercise habits Chair yoga Ti chi Interested in walking the track around her house since weather is cooling down.  O:  Freestyle Libre 3 report: 30 day report: reviewed on 02/23/24 Time active: 100%  Average blood glucose: 159 mg/dL Time above range (>819 mg/dL): 68% Time in range (29-819 mg/dL): 31% Time below range: 1%  Lab Results  Component Value Date   HGBA1C 7.3 (A) 01/23/2024   There were no vitals filed for this visit.  Lipid Panel     Component Value Date/Time   CHOL 156 01/23/2024 1126   TRIG 174 (H) 01/23/2024 1126   HDL 39 (L) 01/23/2024 1126   CHOLHDL 4.0 01/23/2024 1126   LDLCALC 87 01/23/2024 1126    Clinical Atherosclerotic Cardiovascular Disease (ASCVD): No  The 10-year ASCVD risk score (Arnett DK, et al., 2019) is: 29%   Values used to calculate the score:     Age: 73 years     Clincally relevant sex: Female     Is Non-Hispanic African American: No     Diabetic: Yes     Tobacco smoker: No     Systolic Blood Pressure: 155 mmHg     Is BP treated: Yes     HDL Cholesterol: 39 mg/dL     Total Cholesterol: 156 mg/dL   A/P: Diabetes longstanding currently above goal but much improved from 12.5% now down to 7.3% in September. Her A1c goal <7%. CGM shows okay control. No change in her CGM report from last visit. She talked about changing her larger meal to midday and how she would move her larger prandial dose. Confirms that she  does not take her insulins when her BG is <100 mg/dL to avoid any lows. Although asymptomatic, she can verbalize a hypoglycemia plan. Recommended double checking BG if CGM is reading low but does not feel s/sx. Will initiate Mounjaro 2.5 mg once approved by insurance.  -Continue Toujeo  62 units once daily for now. Decrease Toujeo  62 units to 56 units when starting Mounjaro. -Start Mounjaro 2.5 mg once weekly.  -Continue Humalog : take 6 units before your two smaller meals and 14 units before your  largest meal. Skip if pre-prandial glucose is < 100 mg/dL. -Continue metformin  500 mg XR: two tablets (1000mg ) BID.  -Continue utilizing Freestyle Libre sensors.  -Patient educated on purpose, proper use, and potential adverse effects of Mounjaro. Encouraged patient to reach out if she has any issues with Mounjaro.  -Extensively discussed pathophysiology of diabetes, recommended lifestyle interventions, dietary effects on blood sugar control.  -Counseled on s/sx of and management of hypoglycemia.  -Next A1c anticipated 04/2024.  Written patient instructions provided. Patient verbalized understanding of treatment plan.  Total time in face to face counseling 20 minutes.    Follow-up:  Pharmacist: 03/22/24 PCP: 03/06/2024 with Dr. Tanda Jenkins Graces, PharmD PGY1 Pharmacy Resident [phone]

## 2024-02-29 ENCOUNTER — Ambulatory Visit: Payer: Self-pay

## 2024-02-29 NOTE — Telephone Encounter (Signed)
 FYI Only or Action Required?: Action required by provider: request for diflucan  sent to her preferred pharmacy on file.  Patient was last seen in primary care on 01/23/2024 by Tanda Bleacher, MD.  Called Nurse Triage reporting Vaginal Itching.  Symptoms began yesterday.  Interventions attempted: Nothing.  Symptoms are: severe vaginal itching gradually worsening.  Triage Disposition: See Physician Within 24 Hours  Patient/caregiver understands and will follow disposition?: No, wishes to speak with PCP                Message from Cartwright F sent at 02/29/2024 10:58 AM EDT  Reason for Triage: Patient is calling in because she believes she has a yeast infection and is uncomfortable. Patient has an appointment next week, but says she cannot wait that long. Please follow up with patient.   Reason for Disposition  MODERATE-SEVERE itching (i.e., interferes with school, work, or sleep)  Answer Assessment - Initial Assessment Questions Patient declined appointments with available [facility] providers this week stating I only wanna see Dr Tanda cause she knows me and my history. Patient requesting for PCP to send in diflucan  for her. She states she thinks this is a yeast infection because she took doxycycline  this past week for a toe infection that she states has resolved.  1. SYMPTOM: What's the main symptom you're concerned about? (e.g., pain, itching, dryness)     Itching.  2. LOCATION: Where is the  itching located? (e.g., inside/outside, left/right)     All over itchy  3. ONSET: When did the  itching  start?     Yesterday evening, worsening today.  4. PAIN: Is there any pain? If Yes, ask: How bad is it? (Scale: 1-10; mild, moderate, severe)     No.  5. ITCHING: Is there any itching? If Yes, ask: How bad is it? (Scale: 1-10; mild, moderate, severe)     Yes, severe.  6. CAUSE: What do you think is causing the discharge? Have you had the same problem before?  What happened then?     She states she has had a yeast infection before and this feels like that. She states the last time she had a yeast infection was due to being on antibiotics. She states she was took some left over doxycycline  for her left big toe infection within the past week. She states no pain, discharge or oozing from toe.  7. OTHER SYMPTOMS: Do you have any other symptoms? (e.g., fever, itching, vaginal bleeding, pain with urination, injury to genital area, vaginal foreign body)     Denies bleeding, pain, discharge.  8. PREGNANCY: Is there any chance you are pregnant? When was your last menstrual period?     N/A.  Protocols used: Vaginal Symptoms-A-AH

## 2024-02-29 NOTE — Telephone Encounter (Signed)
Please asvise

## 2024-02-29 NOTE — Telephone Encounter (Addendum)
 May I notify patient that diflucan  is being sent to her pharmacy? Or should advised OV is needed. Thank you

## 2024-03-01 ENCOUNTER — Ambulatory Visit: Payer: Self-pay

## 2024-03-01 ENCOUNTER — Other Ambulatory Visit: Payer: Self-pay | Admitting: Family Medicine

## 2024-03-01 MED ORDER — FLUCONAZOLE 150 MG PO TABS: 150.0000 mg | ORAL_TABLET | Freq: Once | ORAL | 0 refills | Status: AC

## 2024-03-01 NOTE — Telephone Encounter (Signed)
 Patient aware

## 2024-03-01 NOTE — Telephone Encounter (Signed)
 FYI Only or Action Required?: FYI only for provider.  Patient was last seen in primary care on 01/23/2024 by Tanda Bleacher, MD.  Called Nurse Triage reporting Advice Only.  Triage Disposition: Information or Advice Only Call  Patient/caregiver understands and will follow disposition?:   Copied from CRM #[id number]. Topic: General - Other >> Mar 01, 2024  1:43 PM Cleave MATSU wrote: Reason for CRM: pt returned call back from Dr. Please call pt back to assist Reason for Disposition  Health information question, no triage required and triager able to answer question  Answer Assessment - Initial Assessment Questions 1. REASON FOR CALL: What is the main reason for your call? or How can I best help you?     Patient has initially called about her medication but provider has called mediation in for her. No needs at this time 2. SYMPTOMS : Do you have any symptoms?      no 3. OTHER QUESTIONS: Do you have any other questions?     no  Protocols used: Information Only Call - No Triage-A-AH

## 2024-03-06 ENCOUNTER — Ambulatory Visit: Admitting: Family Medicine

## 2024-03-06 ENCOUNTER — Encounter: Payer: Self-pay | Admitting: Family Medicine

## 2024-03-06 VITALS — BP 110/77 | HR 79 | Ht 66.0 in | Wt 238.2 lb

## 2024-03-06 DIAGNOSIS — E785 Hyperlipidemia, unspecified: Secondary | ICD-10-CM | POA: Diagnosis not present

## 2024-03-06 DIAGNOSIS — Z7985 Long-term (current) use of injectable non-insulin antidiabetic drugs: Secondary | ICD-10-CM | POA: Diagnosis not present

## 2024-03-06 DIAGNOSIS — E1169 Type 2 diabetes mellitus with other specified complication: Secondary | ICD-10-CM

## 2024-03-06 DIAGNOSIS — Z7984 Long term (current) use of oral hypoglycemic drugs: Secondary | ICD-10-CM | POA: Diagnosis not present

## 2024-03-06 DIAGNOSIS — I10 Essential (primary) hypertension: Secondary | ICD-10-CM | POA: Diagnosis not present

## 2024-03-07 ENCOUNTER — Encounter: Payer: Self-pay | Admitting: Family Medicine

## 2024-03-07 NOTE — Progress Notes (Signed)
 Established Patient Office Visit  Subjective    Patient ID: Amy Garner, female    DOB: 01/04/1956  Age: 68 y.o. MRN: [id number]  CC:  Chief Complaint  Patient presents with   Medical Management of Chronic Issues    HPI MELISSSA Garner presents for routine follow up of chronic med issues including diabetes and hypertension. Patient reports med compliance and denies acute complaints.   Outpatient Encounter Medications as of 03/06/2024  Medication Sig   albuterol  (VENTOLIN  HFA) 108 (90 Base) MCG/ACT inhaler Inhale 2 puffs into the lungs every 6 (six) hours as needed for wheezing or shortness of breath.   Alcohol  Swabs  PADS USE TO TAKE BLOOD SUGAR 2 TIMES A DAY   cholecalciferol (VITAMIN D3) 25 MCG (1000 UNIT) tablet Take 1,000 Units by mouth daily.   Continuous Glucose Receiver (FREESTYLE LIBRE 3 READER) DEVI Check blood sugar continuously throughout the day. E11.69   Continuous Glucose Sensor (FREESTYLE LIBRE 3 PLUS SENSOR) MISC Check blood sugar continuously throughout the day. Change sensor every 15 days. E11.69   esomeprazole  (NEXIUM ) 40 MG capsule TAKE 1 CAPSULE EVERY MORNING   FLUoxetine  (PROZAC ) 20 MG capsule Take 1 capsule (20 mg total) by mouth daily.   glucose blood test strip Use as instructed   ibuprofen  (ADVIL ) 400 MG tablet Take 400 mg by mouth every 6 (six) hours as needed for moderate pain (pain score 4-6).   insulin  glargine, 1 Unit Dial , (TOUJEO  SOLOSTAR) 300 UNIT/ML Solostar Pen INJECT 62 UNITS INTO THE SKIN DAILY.   insulin  lispro (HUMALOG  KWIKPEN) 100 UNIT/ML KwikPen INJECT 6 UNITS BEFORE BREAKFAST AND LUNCH, AND 10 UNITS BEFORE DINNER. DO NOT USE IF PRE-PRANDIAL SUGAR IS UNDER 100   Insulin  Pen Needle (PEN NEEDLES) 32G X 4 MM MISC Use to inject insulin  once daily.   losartan  (COZAAR ) 25 MG tablet Take 1 tablet (25 mg total) by mouth daily.   lovastatin  (MEVACOR ) 10 MG tablet TAKE 1 TABLET AT BEDTIME   meclizine  (ANTIVERT ) 25 MG tablet TAKE 1 TABLET THREE  TIMES DAILY AS NEEDED FOR DIZZINESS   metFORMIN  (GLUCOPHAGE -XR) 500 MG 24 hr tablet TAKE 2 TABLETS twice daily with meals.   Misc. Devices MISC TRUE MATRIX METER   tirzepatide (MOUNJARO) 2.5 MG/0.5ML Pen Inject 2.5 mg into the skin once a week.   triamterene -hydrochlorothiazide  (DYAZIDE ) 37.5-25 MG capsule TAKE 1 CAPSULE EVERY DAY   TRUEplus Lancets 33G MISC USE TO TAKE BLOOD SUGAR 2 TIMES A DAY   No facility-administered encounter medications on file as of 03/06/2024.    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    Peptic ulcer    Pneumonia    Type 2 diabetes mellitus (HCC)     Past Surgical History:  Procedure Laterality Date   AMPUTATION Right 08/22/2023   Procedure: AMPUTATION, FOOT, RAY;  Surgeon: Malvin Marsa FALCON, DPM;  Location: [facility];  Service: Orthopedics/Podiatry;  Laterality: Right;   APPENDECTOMY  1990   APPLICATION OF WOUND VAC Right 08/22/2023   Procedure: APPLICATION, WOUND VAC;  Surgeon: Malvin Marsa FALCON, DPM;  Location: [facility];  Service: Orthopedics/Podiatry;  Laterality: Right;  BREAST BIOPSY     BUNIONECTOMY Right    CAPSULOTOMY METATARSOPHALANGEAL Right 02/07/2023   Procedure: CAPSULOTOMY METATARSOPHALANGEAL SECOND AND THIRD;  Surgeon: Tobie Franky SQUIBB, DPM;  Location: [facility];  Service: Podiatry;  Laterality: Right;   CERVICAL DISC SURGERY     CHOLECYSTECTOMY     COLONOSCOPY     DIAGNOSTIC LAPAROSCOPY  1980   endometriosis   HALLUX FUSION Right 02/07/2023   Procedure: HALLUX FUSION METATARSAL PHALANGEAL JOINT;  Surgeon: Tobie Franky SQUIBB, DPM;  Location: [facility];  Service: Podiatry;  Laterality: Right;  POPLITEAL BLOCK   HAMMER TOE SURGERY  Right 02/07/2023   Procedure: HAMMER TOE CORRECTION SECOND AND THIRD;  Surgeon: Tobie Franky SQUIBB, DPM;  Location: [facility];  Service: Podiatry;  Laterality: Right;   HERNIA REPAIR  Inguinal right & left repair.   INGUINAL HERNIA REPAIR Left 05/02/2020   Procedure: OPEN LEFT INGUINAL HERNIA REPAIR WITH MESH, TAP BLOCK;  Surgeon: Signe Mitzie LABOR, MD;  Location: [facility];  Service: General;  Laterality: Left;   IRRIGATION AND DEBRIDEMENT FOOT Right 08/24/2023   Procedure: IRRIGATION AND DEBRIDEMENT FOOT;  Surgeon: Malvin Marsa FALCON, DPM;  Location: [facility];  Service: Orthopedics/Podiatry;  Laterality: Right;  Irrigation and Debridement Right Foot Wound with application of skin graft, Antibiotic Beads, and Delayed Closure of Wound   TUBAL LIGATION  1990s    Family History  Problem Relation Age of Onset   Heart attack Mother    Asthma Mother    Heart disease Father    Hypertension Father    Stomach cancer Paternal Aunt    Colon cancer Paternal Aunt    Varicose Veins Maternal Grandmother    Diabetes Paternal Grandmother    ADD / ADHD Daughter    ADD / ADHD Son     Social History   Socioeconomic History   Marital status: Widowed    Spouse name: Not on file   Number of children: Not on file   Years of education: Not on file   Highest education level: Some college, no degree  Occupational History   Not on file  Tobacco Use   Smoking status: Former    Current packs/day: 0.00    Average packs/day: 1.1 packs/day for 20.0 years (22.5 ttl pk-yrs)    Types: Cigarettes    Start date: 2002    Quit date: 2017    Years since quitting: 8.8   Smokeless tobacco: Never  Vaping Use   Vaping status: Never Used  Substance and Sexual Activity   Alcohol  use: Never   Drug use: Never   Sexual activity: Yes    Birth control/protection: Surgical  Other Topics Concern   Not on file  Social History Narrative   Lives alone   Social Drivers of Health   Financial Resource Strain: Medium Risk  (03/05/2024)   Overall Financial Resource Strain (CARDIA)    Difficulty of Paying Living Expenses: Somewhat hard  Food Insecurity: Food Insecurity Present (03/05/2024)   Hunger Vital Sign    Worried About Running Out of Food in the Last Year: Sometimes true    Ran Out of Food in the Last Year: Sometimes true  Transportation Needs: No Transportation Needs (03/05/2024)   PRAPARE - Administrator, Civil Service (Medical): No    Lack of Transportation (Non-Medical): No  Physical Activity: Insufficiently Active (03/05/2024)   Exercise Vital Sign    Days of Exercise per Week: 3 days    Minutes of Exercise per Session: 10 min  Stress: No Stress Concern Present (03/05/2024)   [employer] of Occupational Health - Occupational Stress Questionnaire    Feeling of Stress: Only a little  Social Connections: Moderately Isolated (03/05/2024)   Social Connection and Isolation Panel    Frequency of Communication with Friends and Family: More than three times a week    Frequency of Social Gatherings with Friends and Family: More than three times a week    Attends Religious Services: 1 to 4 times per year    Active Member of [employer] or Organizations: No    Attends Banker Meetings: Not on file    Marital Status: Widowed  Intimate Partner Violence: Not At Risk (08/26/2023)   Humiliation, Afraid, Rape, and Kick questionnaire    Fear of Current or Ex-Partner: No    Emotionally Abused: No    Physically Abused: No    Sexually Abused: No    Review of Systems  All other systems reviewed and are negative.       Objective    BP 110/77   Pulse 79   Ht 5' 6 (1.676 m)   Wt 238 lb 3.2 oz (108 kg)   SpO2 94%   BMI 38.45 kg/m   Physical Exam Vitals and nursing note reviewed.  Constitutional:      General: She is not in acute distress.    Appearance: She is obese.  Cardiovascular:     Rate and Rhythm: Normal rate and regular rhythm.  Pulmonary:     Effort: Pulmonary  effort is normal.     Breath sounds: Normal breath sounds.  Abdominal:     Palpations: Abdomen is soft.     Tenderness: There is no abdominal tenderness.  Neurological:     General: No focal deficit present.     Mental Status: She is alert and oriented to person, place, and time.  Psychiatric:        Mood and Affect: Mood and affect normal.        Speech: Speech normal.        Behavior: Behavior normal.         Assessment & Plan:  1. Type 2 diabetes mellitus with other specified complication, without long-term current use of insulin  (HCC) (Primary) Improving A1c and now near goal. Continue   2. Essential hypertension Appears stable. Continue   3. Hyperlipidemia, unspecified hyperlipidemia type Continue    Return in about 3 months (around 06/06/2024) for follow up.   Tanda Raguel SQUIBB, MD

## 2024-03-11 ENCOUNTER — Other Ambulatory Visit: Payer: Self-pay | Admitting: Family Medicine

## 2024-03-11 DIAGNOSIS — E1169 Type 2 diabetes mellitus with other specified complication: Secondary | ICD-10-CM

## 2024-03-13 NOTE — Telephone Encounter (Signed)
 Requested medication (s) are due for refill today: routing for review  Requested medication (s) are on the active medication list: yes  Last refill:  09/27/23 and 12/30/23  Future visit scheduled: no  Notes to clinic:  not on current med list.     Requested Prescriptions  Pending Prescriptions Disp Refills   Continuous Glucose Sensor (FREESTYLE LIBRE 3 PLUS SENSOR) MISC [Pharmacy Med Name: FREESTYLE LIBRE 3 PLUS/SENSOR/GLUCOSE MONITORING SYSTEM]  3    Sig: CHECK BLOOD SUGAR CONTINUOUSLY THROUGHOUT THE DAY. CHANGE SENSOR EVERY 15 DAYS.     There is no refill protocol information for this order     TOUJEO  SOLOSTAR 300 UNIT/ML Solostar Pen [Pharmacy Med Name: TOUJEO  SOLOSTAR 300 UNIT/ML Subcutaneous Solution Pen-injector]  3    Sig: INJECT 62 UNITS INTO THE SKIN DAILY.     Endocrinology:  Diabetes - Insulins Passed - 03/13/2024 12:15 PM      Passed - HBA1C is between 0 and 7.9 and within 180 days    HbA1c, POC (controlled diabetic range)  Date Value Ref Range Status  01/23/2024 7.3 (A) 0.0 - 7.0 % Final         Passed - Valid encounter within last 6 months    Recent Outpatient Visits           1 week ago Type 2 diabetes mellitus with other specified complication, without long-term current use of insulin  [facility])   [facility], Raguel, MD   2 weeks ago Type 2 diabetes mellitus with other specified complication, with long-term current use of insulin  [facility])   [facility] [place]. [facility] Fleeta Morris, Garnette CROME, RPH-CPP   1 month ago Annual physical exam   [facility], MD   2 months ago Type 2 diabetes mellitus with other specified complication, with long-term current use of insulin  [facility])   [facility] [place]. [facility] Fleeta Morris, [place] L, RPH-CPP   3 months ago Type 2 diabetes mellitus with other specified  complication, without long-term current use of insulin  [facility])    [facility], MD

## 2024-03-22 ENCOUNTER — Encounter: Payer: Self-pay | Admitting: Pharmacist

## 2024-03-22 ENCOUNTER — Ambulatory Visit: Payer: Self-pay | Attending: Family Medicine | Admitting: Pharmacist

## 2024-03-22 VITALS — Wt 239.4 lb

## 2024-03-22 DIAGNOSIS — Z7985 Long-term (current) use of injectable non-insulin antidiabetic drugs: Secondary | ICD-10-CM | POA: Diagnosis not present

## 2024-03-22 DIAGNOSIS — Z7984 Long term (current) use of oral hypoglycemic drugs: Secondary | ICD-10-CM | POA: Diagnosis not present

## 2024-03-22 DIAGNOSIS — Z794 Long term (current) use of insulin: Secondary | ICD-10-CM | POA: Diagnosis not present

## 2024-03-22 DIAGNOSIS — E1169 Type 2 diabetes mellitus with other specified complication: Secondary | ICD-10-CM

## 2024-03-22 MED ORDER — TIRZEPATIDE 5 MG/0.5ML ~~LOC~~ SOAJ: 5.0000 mg | SUBCUTANEOUS | 1 refills | Status: DC

## 2024-03-22 MED ORDER — TOUJEO SOLOSTAR 300 UNIT/ML ~~LOC~~ SOPN: 50.0000 [IU] | PEN_INJECTOR | Freq: Every day | SUBCUTANEOUS | 3 refills | Status: DC

## 2024-03-22 NOTE — Progress Notes (Signed)
 S:     No chief complaint on file.  68 y.o. female who presents for diabetes evaluation, education, and management. PMH is significant for T2DM, HLD, GERD.   Patient was referred and last seen by Primary Care Provider, Dr. Tanda, on 03/06/2024. At her last visit with pharmacy on 02/23/2024 we started Mounjaro as she expressed a desire to lose weight.      Today, patient reports to clinic for follow-up. She reports doing well. Denies any side effects from insulins. No s/sx of hypo or hyper glycemia. She is tolerating the Mounjaro well. Denies any NV, abdominal pain, or changes in vision.   Family/Social History:  Fhx: MI, asthma Tobacco: former smoker (quit in 2017) Alcohol : none reported   Current diabetes medications include: Toujeo  56 units daily, metformin  500 mg XR (1000mg  daily), Humalog : take 6 units before your two smaller meals and 4 units before your largest meal. Skip if pre-prandial glucose is < 100 mg/dL.  -Of note, patient has only been taking Humalog  6 units with breakfast due to improvements in sugar readings since starting Mounjaro.  Patient reports adherence to taking all medications as prescribed.  Insurance coverage: [employer]  Patient denies hypoglycemic events.  Patient denies nocturia (nighttime urination).  Patient denies neuropathy (nerve pain). Patient denies visual changes. Patient reports self foot exams.   Patient reported dietary habits:  -Since hospital admission, her daughter is helping her manage her diet. -Protein-shakes, salad  -Snacks: apples and peanut butter  Patient-reported exercise habits: starting to increase exercise habits Chair yoga Ti chi Interested in walking the track around her house since weather is cooling down.  O:  Freestyle Libre 3 report: 30 day report: reviewed on 02/23/24 Time active: 100%  Average blood glucose: 139 mg/dL Time above range (>819 mg/dL): 84% Time in range (29-819 mg/dL): 15% Time below  range: 1%  Lab Results  Component Value Date   HGBA1C 7.3 (A) 01/23/2024   There were no vitals filed for this visit.  Lipid Panel     Component Value Date/Time   CHOL 156 01/23/2024 1126   TRIG 174 (H) 01/23/2024 1126   HDL 39 (L) 01/23/2024 1126   CHOLHDL 4.0 01/23/2024 1126   LDLCALC 87 01/23/2024 1126    Clinical Atherosclerotic Cardiovascular Disease (ASCVD): No  The 10-year ASCVD risk score (Arnett DK, et al., 2019) is: 15.7%   Values used to calculate the score:     Age: 15 years     Clincally relevant sex: Female     Is Non-Hispanic African American: No     Diabetic: Yes     Tobacco smoker: No     Systolic Blood Pressure: 110 mmHg     Is BP treated: Yes     HDL Cholesterol: 39 mg/dL     Total Cholesterol: 156 mg/dL   A/P: Diabetes longstanding currently close to goal based on A1c of 7.3% in September. Her A1c goal <7%. CGM shows goal glycemic control at home. Although asymptomatic, she can verbalize a hypoglycemia plan. Will continue to titrate Mounjaro and decrease insulin . -DECREASE Toujeo  TO 50 units. -INCREASE Mounjaro TO 5 mg once weekly.  -Continue Humalog : take 4 units before BREAKFAST. -Continue metformin  500 mg XR: two tablets (1000mg ) BID.  -Continue utilizing Freestyle Libre sensors.  -Patient educated on purpose, proper use, and potential adverse effects of Mounjaro. Encouraged patient to reach out if she has any issues with Mounjaro.  -Extensively discussed pathophysiology of diabetes, recommended lifestyle interventions, dietary effects  on blood sugar control.  -Counseled on s/sx of and management of hypoglycemia.  -Next A1c anticipated 04/2024.  Written patient instructions provided. Patient verbalized understanding of treatment plan.  Total time in face to face counseling 20 minutes.    Follow-up:  Pharmacist: in 1 month  Herlene Fleeta Morris, PharmD, [place], CPP Clinical Pharmacist [facility] [phone]

## 2024-04-04 ENCOUNTER — Other Ambulatory Visit: Payer: Self-pay | Admitting: Family Medicine

## 2024-04-04 DIAGNOSIS — E1169 Type 2 diabetes mellitus with other specified complication: Secondary | ICD-10-CM

## 2024-04-30 ENCOUNTER — Ambulatory Visit: Attending: Family Medicine | Admitting: Pharmacist

## 2024-04-30 ENCOUNTER — Encounter: Payer: Self-pay | Admitting: Pharmacist

## 2024-04-30 VITALS — Wt 227.8 lb

## 2024-04-30 DIAGNOSIS — Z794 Long term (current) use of insulin: Secondary | ICD-10-CM

## 2024-04-30 DIAGNOSIS — Z7985 Long-term (current) use of injectable non-insulin antidiabetic drugs: Secondary | ICD-10-CM | POA: Diagnosis not present

## 2024-04-30 DIAGNOSIS — Z7984 Long term (current) use of oral hypoglycemic drugs: Secondary | ICD-10-CM | POA: Diagnosis not present

## 2024-04-30 DIAGNOSIS — E1169 Type 2 diabetes mellitus with other specified complication: Secondary | ICD-10-CM

## 2024-04-30 LAB — POCT GLYCOSYLATED HEMOGLOBIN (HGB A1C): HbA1c, POC (controlled diabetic range): 6.3 % (ref 0.0–7.0)

## 2024-04-30 MED ORDER — TOUJEO SOLOSTAR 300 UNIT/ML ~~LOC~~ SOPN: 40.0000 [IU] | PEN_INJECTOR | Freq: Every day | SUBCUTANEOUS | 3 refills | Status: AC

## 2024-04-30 MED ORDER — TIRZEPATIDE 10 MG/0.5ML ~~LOC~~ SOAJ: 10.0000 mg | SUBCUTANEOUS | 1 refills | Status: AC

## 2024-04-30 MED ORDER — TIRZEPATIDE 7.5 MG/0.5ML ~~LOC~~ SOAJ: 7.5000 mg | SUBCUTANEOUS | 0 refills | Status: DC

## 2024-04-30 NOTE — Progress Notes (Signed)
 S:     No chief complaint on file.  68 y.o. female who presents for diabetes evaluation, education, and management. PMH is significant for T2DM, HLD, GERD.   Patient was referred and last seen by Primary Care Provider, Dr. Tanda, on 03/06/2024. At her last visit with pharmacy on 03/22/24, we increased Mounjaro  to the 5 mg weekly dose as she expressed a desire to lose weight.      Today, patient reports to clinic for follow-up. She reports doing well. Denies any side effects from her medication. No s/sx of hypo or hyper glycemia. She is tolerating the Mounjaro  well. Denies any NV, abdominal pain, or changes in vision. Of note, she has taken the Humalog  since our last visit as her sugar control at home has been optimal. She brings her [facility] 3 receiver in for review.   Family/Social History:  Fhx: MI, asthma Tobacco: former smoker (quit in 2017) Alcohol : none reported   Current diabetes medications include: Toujeo  50 units daily, metformin  500 mg XR (1000mg  daily), Humalog  (not taking), Mounjaro  5 mg weekly  Patient reports adherence to taking all medications as prescribed except for Humalog .   Insurance coverage: [employer] Medicare  Patient denies hypoglycemic events.  Patient denies nocturia (nighttime urination).  Patient denies neuropathy (nerve pain). Patient denies visual changes. Patient reports self foot exams.   Patient reported dietary habits:  -Since hospital admission, her daughter is helping her manage her diet. -Protein-shakes, salad  -Snacks: apples and peanut butter  Patient-reported exercise habits: starting to increase exercise habits Chair yoga Ti chi Interested in walking the track around her house since weather is cooling down.  O:  Freestyle Libre 3 report: 30 day report: reviewed on 04/30/2024 Time active: 100%  Average blood glucose: 109 mg/dL Time above range (>819 mg/dL): 1% Time in range (29-819 mg/dL): 01% Time below range: 1%  Lab Results   Component Value Date   HGBA1C 7.3 (A) 01/23/2024   There were no vitals filed for this visit.  Lipid Panel     Component Value Date/Time   CHOL 156 01/23/2024 1126   TRIG 174 (H) 01/23/2024 1126   HDL 39 (L) 01/23/2024 1126   CHOLHDL 4.0 01/23/2024 1126   LDLCALC 87 01/23/2024 1126    Clinical Atherosclerotic Cardiovascular Disease (ASCVD): No  The 10-year ASCVD risk score (Arnett DK, et al., 2019) is: 15.7%   Values used to calculate the score:     Age: 21 years     Clinically relevant sex: Female     Is Non-Hispanic African American: No     Diabetic: Yes     Tobacco smoker: No     Systolic Blood Pressure: 110 mmHg     Is BP treated: Yes     HDL Cholesterol: 39 mg/dL     Total Cholesterol: 156 mg/dL   A/P: Diabetes longstanding currently at goal based on A1c of 6.3% today! Commended her for this! A1c goal <7%. CGM shows goal glycemic control at home - TIR over the last 30 days is 98%! Although asymptomatic, she can verbalize a hypoglycemia plan. Will continue to titrate Mounjaro  and decrease insulin . -DECREASE Toujeo  TO 40 units. -INCREASE Mounjaro  TO 7.5 mg once weekly.  -STOP Humalog . -Continue metformin  500 mg XR: two tablets (1000mg ) BID.  -Continue utilizing Freestyle Libre sensors.  -Patient educated on purpose, proper use, and potential adverse effects of Mounjaro . Encouraged patient to reach out if she has any issues with Mounjaro .  -Extensively discussed pathophysiology of  diabetes, recommended lifestyle interventions, dietary effects on blood sugar control.  -Counseled on s/sx of and management of hypoglycemia.  -Next A1c anticipated 07/2024.  Written patient instructions provided. Patient verbalized understanding of treatment plan.  Total time in face to face counseling 20 minutes.    Follow-up:  Pharmacist: in 2 months PCP: 06/06/2024  Herlene Fleeta Morris, PharmD, BCACP, CPP Clinical Pharmacist [facility] [phone]

## 2024-05-01 ENCOUNTER — Ambulatory Visit: Payer: Self-pay | Admitting: Family Medicine

## 2024-05-01 NOTE — Progress Notes (Signed)
 Amy Garner                                          MRN: [id number]   05/01/2024   The [facility] Quality Team Specialist reviewed this patient medical record for the purposes of chart review for care gap closure. The following were reviewed: chart review for care gap closure-kidney health evaluation for diabetes:eGFR  and uACR.    [facility]I Quality Team

## 2024-06-06 ENCOUNTER — Ambulatory Visit: Admitting: Family Medicine

## 2024-06-06 ENCOUNTER — Encounter: Payer: Self-pay | Admitting: Family Medicine

## 2024-06-06 VITALS — BP 153/68 | HR 84 | Ht 66.0 in | Wt 228.0 lb

## 2024-06-06 DIAGNOSIS — F418 Other specified anxiety disorders: Secondary | ICD-10-CM | POA: Diagnosis not present

## 2024-06-06 DIAGNOSIS — E785 Hyperlipidemia, unspecified: Secondary | ICD-10-CM | POA: Diagnosis not present

## 2024-06-06 DIAGNOSIS — I10 Essential (primary) hypertension: Secondary | ICD-10-CM

## 2024-06-06 DIAGNOSIS — K219 Gastro-esophageal reflux disease without esophagitis: Secondary | ICD-10-CM | POA: Diagnosis not present

## 2024-06-06 DIAGNOSIS — E119 Type 2 diabetes mellitus without complications: Secondary | ICD-10-CM | POA: Diagnosis not present

## 2024-06-06 DIAGNOSIS — E66812 Obesity, class 2: Secondary | ICD-10-CM

## 2024-06-06 DIAGNOSIS — Z6836 Body mass index (BMI) 36.0-36.9, adult: Secondary | ICD-10-CM | POA: Diagnosis not present

## 2024-06-06 DIAGNOSIS — F32A Depression, unspecified: Secondary | ICD-10-CM

## 2024-06-06 DIAGNOSIS — E1169 Type 2 diabetes mellitus with other specified complication: Secondary | ICD-10-CM

## 2024-06-06 MED ORDER — TRIAMTERENE-HCTZ 37.5-25 MG PO CAPS: 1.0000 | ORAL_CAPSULE | Freq: Every day | ORAL | 1 refills | Status: AC

## 2024-06-06 NOTE — Progress Notes (Signed)
 "  Established Patient Office Visit  Subjective    Patient ID: Amy Garner, female    DOB: [birth year]  Age: 69 y.o. MRN: [id number]  CC:  Chief Complaint  Patient presents with   Medical Management of Chronic Issues    HPI Amy Garner presents for routine follow up of chronic med issues including diabetes, and hypertension. Patient reports that she has been out of one of her BP meds for a couple of weeks.   Outpatient Encounter Medications as of 06/06/2024  Medication Sig   albuterol  (VENTOLIN  HFA) 108 (90 Base) MCG/ACT inhaler Inhale 2 puffs into the lungs every 6 (six) hours as needed for wheezing or shortness of breath.   cholecalciferol (VITAMIN D3) 25 MCG (1000 UNIT) tablet Take 1,000 Units by mouth daily.   Continuous Glucose Receiver (FREESTYLE LIBRE 3 READER) DEVI Check blood sugar continuously throughout the day. E11.69   Continuous Glucose Sensor (FREESTYLE LIBRE 3 PLUS SENSOR) MISC CHECK BLOOD SUGAR CONTINUOUSLY THROUGHOUT THE DAY. CHANGE SENSOR EVERY 15 DAYS.   esomeprazole  (NEXIUM ) 40 MG capsule TAKE 1 CAPSULE EVERY MORNING   FLUoxetine  (PROZAC ) 20 MG capsule TAKE 1 CAPSULE EVERY DAY   ibuprofen  (ADVIL ) 400 MG tablet Take 400 mg by mouth every 6 (six) hours as needed for moderate pain (pain score 4-6).   insulin  glargine, 1 Unit Dial , (TOUJEO  SOLOSTAR) 300 UNIT/ML Solostar Pen Inject 40 Units into the skin daily.   Insulin  Pen Needle (PEN NEEDLES) 32G X 4 MM MISC Use to inject insulin  once daily.   losartan  (COZAAR ) 25 MG tablet Take 1 tablet (25 mg total) by mouth daily.   lovastatin  (MEVACOR ) 10 MG tablet TAKE 1 TABLET AT BEDTIME   meclizine  (ANTIVERT ) 25 MG tablet TAKE 1 TABLET THREE TIMES DAILY AS NEEDED FOR DIZZINESS   metFORMIN  (GLUCOPHAGE -XR) 500 MG 24 hr tablet TAKE 2 TABLETS twice daily with meals.   tirzepatide  (MOUNJARO ) 10 MG/0.5ML Pen Inject 10 mg into the skin once a week.   Alcohol  Swabs  PADS USE TO TAKE BLOOD SUGAR 2 TIMES A DAY   glucose blood  test strip Use as instructed   Misc. Devices MISC TRUE MATRIX METER   tirzepatide  (MOUNJARO ) 7.5 MG/0.5ML Pen Inject 7.5 mg into the skin once a week.   triamterene -hydrochlorothiazide  (DYAZIDE ) 37.5-25 MG capsule Take 1 each (1 capsule total) by mouth daily.   TRUEplus Lancets 33G MISC USE TO TAKE BLOOD SUGAR 2 TIMES A DAY   [DISCONTINUED] triamterene -hydrochlorothiazide  (DYAZIDE ) 37.5-25 MG capsule TAKE 1 CAPSULE EVERY DAY   No facility-administered encounter medications on file as of 06/06/2024.    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    Peptic ulcer    Pneumonia    Type 2 diabetes mellitus (HCC)     Past Surgical History:  Procedure Laterality Date   AMPUTATION Right 08/22/2023   Procedure: AMPUTATION, FOOT, RAY;  Surgeon: Malvin Marsa FALCON, DPM;  Location: [facility];  Service: Orthopedics/Podiatry;  Laterality: Right;   APPENDECTOMY  1990   APPLICATION OF WOUND VAC Right 08/22/2023   Procedure: APPLICATION, WOUND VAC;  Surgeon: Malvin Marsa FALCON, DPM;  Location: [facility];  Service: Orthopedics/Podiatry;  Laterality: Right;   BREAST BIOPSY     BUNIONECTOMY Right    CAPSULOTOMY METATARSOPHALANGEAL Right 02/07/2023   Procedure: CAPSULOTOMY METATARSOPHALANGEAL SECOND AND THIRD;  Surgeon: Tobie Franky SQUIBB, DPM;  Location: [facility];  Service: Podiatry;  Laterality: Right;   CERVICAL DISC SURGERY     CHOLECYSTECTOMY     COLONOSCOPY     DIAGNOSTIC LAPAROSCOPY  1980   endometriosis   HALLUX FUSION Right 02/07/2023   Procedure: HALLUX FUSION METATARSAL PHALANGEAL JOINT;  Surgeon: Tobie Franky SQUIBB, DPM;  Location: [facility];  Service: Podiatry;  Laterality: Right;  POPLITEAL  BLOCK   HAMMER TOE SURGERY Right 02/07/2023   Procedure: HAMMER TOE CORRECTION SECOND AND THIRD;  Surgeon: Tobie Franky SQUIBB, DPM;  Location: [facility];  Service: Podiatry;  Laterality: Right;   HERNIA REPAIR  Inguinal right & left repair.   INGUINAL HERNIA REPAIR Left 05/02/2020   Procedure: OPEN LEFT INGUINAL HERNIA REPAIR WITH MESH, TAP BLOCK;  Surgeon: Signe Mitzie LABOR, MD;  Location: [facility];  Service: General;  Laterality: Left;   IRRIGATION AND DEBRIDEMENT FOOT Right 08/24/2023   Procedure: IRRIGATION AND DEBRIDEMENT FOOT;  Surgeon: Malvin Marsa FALCON, DPM;  Location: [facility];  Service: Orthopedics/Podiatry;  Laterality: Right;  Irrigation and Debridement Right Foot Wound with application of skin graft, Antibiotic Beads, and Delayed Closure of Wound   TUBAL LIGATION  1990s    Family History  Problem Relation Age of Onset   Heart attack Mother    Asthma Mother    Heart disease Father    Hypertension Father    Stomach cancer Paternal Aunt    Colon cancer Paternal Aunt    Varicose Veins Maternal Grandmother    Diabetes Paternal Grandmother    ADD / ADHD Daughter    ADD / ADHD Son     Social History   Socioeconomic History   Marital status: Widowed    Spouse name: Not on file   Number of children: Not on file   Years of education: Not on file   Highest education level: Some college, no degree  Occupational History   Not on file  Tobacco Use   Smoking status: Former    Current packs/day: 0.00    Average packs/day: 1.1 packs/day for 20.0 years (22.5 ttl pk-yrs)    Types: Cigarettes    Start date: 2002    Quit date: 2017    Years since quitting: 9.0   Smokeless tobacco: Never  Vaping Use   Vaping status: Never Used  Substance and Sexual Activity   Alcohol  use: Never   Drug use: Never   Sexual activity: Yes    Birth control/protection: Surgical  Other Topics Concern   Not on file  Social History Narrative   Lives alone   Social Drivers of Health   Tobacco Use:  Medium Risk (06/06/2024)   Patient History    Smoking Tobacco Use: Former    Smokeless Tobacco Use: Never    Passive Exposure: Not on Actuary Strain: Low Risk (06/06/2024)   Overall Financial Resource Strain (CARDIA)    Difficulty of Paying Living Expenses: Not very hard  Food Insecurity: No Food Insecurity (06/06/2024)   Epic    Worried About Radiation Protection Practitioner of Food in the Last Year: Never true    Ran Out of Food in the Last Year: Never true  Transportation Needs: No Transportation Needs (06/06/2024)   Epic    Lack of Transportation (Medical):  No    Lack of Transportation (Non-Medical): No  Physical Activity: Insufficiently Active (03/05/2024)   Exercise Vital Sign    Days of Exercise per Week: 3 days    Minutes of Exercise per Session: 10 min  Stress: No Stress Concern Present (06/06/2024)   [employer] of Occupational Health - Occupational Stress Questionnaire    Feeling of Stress: Not at all  Social Connections: Moderately Isolated (03/05/2024)   Social Connection and Isolation Panel    Frequency of Communication with Friends and Family: More than three times a week    Frequency of Social Gatherings with Friends and Family: More than three times a week    Attends Religious Services: 1 to 4 times per year    Active Member of [employer] or Organizations: No    Attends Banker Meetings: Not on file    Marital Status: Widowed  Intimate Partner Violence: Not At Risk (06/06/2024)   Epic    Fear of Current or Ex-Partner: No    Emotionally Abused: No    Physically Abused: No    Sexually Abused: No  Depression (PHQ2-9): Low Risk (06/06/2024)   Depression (PHQ2-9)    PHQ-2 Score: 0  Alcohol  Screen: Low Risk (03/05/2024)   Alcohol  Screen    Last Alcohol  Screening Score (AUDIT): 1  Housing: Low Risk (06/06/2024)   Epic    Unable to Pay for Housing in the Last Year: No    Number of Times Moved in the Last Year: 0    Homeless in the Last Year: No  Utilities:  Not At Risk (06/06/2024)   Epic    Threatened with loss of utilities: No  Health Literacy: Adequate Health Literacy (06/06/2024)   B1300 Health Literacy    Frequency of need for help with medical instructions: Never    Review of Systems  All other systems reviewed and are negative.       Objective    BP (!) 153/68   Pulse 84   Ht 5' 6 (1.676 m)   Wt 228 lb (103.4 kg)   SpO2 96%   BMI 36.80 kg/m   Physical Exam Vitals and nursing note reviewed.  Constitutional:      General: She is not in acute distress.    Appearance: She is obese.  Cardiovascular:     Rate and Rhythm: Normal rate and regular rhythm.  Pulmonary:     Effort: Pulmonary effort is normal.     Breath sounds: Normal breath sounds.  Abdominal:     Palpations: Abdomen is soft.     Tenderness: There is no abdominal tenderness.  Neurological:     General: No focal deficit present.     Mental Status: She is alert and oriented to person, place, and time.  Psychiatric:        Mood and Affect: Mood and affect normal.        Speech: Speech normal.        Behavior: Behavior normal.         Assessment & Plan:  1. Type 2 diabetes mellitus with other specified complication, without long-term current use of insulin  (HCC) (Primary) Continue. Last A1c at goal.   2. Essential hypertension Slightly elevated readings. Discussed compliance. Meds refilled.   3. Hyperlipidemia, unspecified hyperlipidemia type Continue   4. Anxiety and depression Appears stable.   5. Gastroesophageal reflux disease without esophagitis Continue   6. Class 2 severe obesity due to excess calories with serious comorbidity and body mass index (  BMI) of 36.0 to 36.9 in adult     No follow-ups on file.   Tanda Raguel SQUIBB, MD  "

## 2024-06-18 ENCOUNTER — Other Ambulatory Visit: Payer: Self-pay | Admitting: Family Medicine

## 2024-06-18 DIAGNOSIS — E1169 Type 2 diabetes mellitus with other specified complication: Secondary | ICD-10-CM

## 2024-06-18 NOTE — Telephone Encounter (Signed)
 Copied from CRM [id number]. Topic: Clinical - Medication Refill >> Jun 18, 2024  3:56 PM Darshell M wrote: Medication: metFORMIN  (GLUCOPHAGE -XR) 500 MG 24 hr tablet FLUoxetine  (PROZAC ) 20 MG capsule losartan  (COZAAR ) 25 MG tablet  Has the patient contacted their pharmacy? Yes (Agent: If no, request that the patient contact the pharmacy for the refill. If patient does not wish to contact the pharmacy document the reason why and proceed with request.) (Agent: If yes, when and what did the pharmacy advise?)  [facility] [place], [place] - [address] AT Portal to [facility] [address] [place] [address] Phone: [phone] Fax: [phone] Hours: Not open 24 hours    This is the patient's preferred pharmacy:   Is this the correct pharmacy for this prescription? Yes If no, delete pharmacy and type the correct one.   Has the prescription been filled recently? No  Is the patient out of the medication? No  Has the patient been seen for an appointment in the last year OR does the patient have an upcoming appointment? Yes  Can we respond through MyChart? No  Agent: Please be advised that Rx refills may take up to 3 business days. We ask that you follow-up with your pharmacy.

## 2024-06-20 MED ORDER — METFORMIN HCL ER 500 MG PO TB24: ORAL_TABLET | ORAL | 0 refills | Status: AC

## 2024-06-20 MED ORDER — FLUOXETINE HCL 20 MG PO CAPS: 20.0000 mg | ORAL_CAPSULE | Freq: Every day | ORAL | 0 refills | Status: AC

## 2024-06-20 MED ORDER — LOSARTAN POTASSIUM 25 MG PO TABS: 25.0000 mg | ORAL_TABLET | Freq: Every day | ORAL | 0 refills | Status: AC

## 2024-06-20 NOTE — Telephone Encounter (Signed)
 [facility]  Requested Prescriptions  Pending Prescriptions Disp Refills   metFORMIN  (GLUCOPHAGE -XR) 500 MG 24 hr tablet 360 tablet 0    Sig: TAKE 2 TABLETS twice daily with meals.     Endocrinology:  Diabetes - Biguanides Failed - 06/20/2024 10:22 AM      Failed - B12 Level in normal range and within 720 days    No results found for: VITAMINB12       Passed - Cr in normal range and within 360 days    Creatinine, Ser  Date Value Ref Range Status  01/23/2024 0.80 0.57 - 1.00 mg/dL Final         Passed - HBA1C is between 0 and 7.9 and within 180 days    HbA1c, POC (controlled diabetic range)  Date Value Ref Range Status  04/30/2024 6.3 0.0 - 7.0 % Final         Passed - eGFR in normal range and within 360 days    GFR, Estimated  Date Value Ref Range Status  11/01/2023 >60 >60 mL/min Final    Comment:    (NOTE) Calculated using the CKD-EPI Creatinine Equation (2021)    GFR  Date Value Ref Range Status  12/25/2019 59.72 (L) >60.00 mL/min Final   eGFR  Date Value Ref Range Status  01/23/2024 80 >59 mL/min/1.73 Final         Passed - Valid encounter within last 6 months    Recent Outpatient Visits           2 weeks ago Type 2 diabetes mellitus with other specified complication, without long-term current use of insulin  (HCC)   [place] [facility], MD   1 month ago Type 2 diabetes mellitus with other specified complication, with long-term current use of insulin  [facility])   [place] [facility] Of [place]. [facility] Fleeta Morris, [place] L, RPH-CPP   3 months ago Type 2 diabetes mellitus with other specified complication, with long-term current use of insulin  [facility])   [place] [facility] Of [place]. [facility] Fleeta Morris, [place] L, RPH-CPP   3 months ago Type 2 diabetes mellitus with other specified complication, without long-term current use of  insulin  [facility])   [place] [facility] [facility], MD   3 months ago Type 2 diabetes mellitus with other specified complication, with long-term current use of insulin  [facility]   [place] [facility] A Dept Of . [facility], [place] L, RPH-CPP              Passed - CBC within normal limits and completed in the last 12 months    WBC  Date Value Ref Range Status  01/23/2024 7.3 3.4 - 10.8 x10E3/uL Final  11/01/2023 8.2 4.0 - 10.5 K/uL Final   RBC  Date Value Ref Range Status  01/23/2024 4.28 3.77 - 5.28 x10E6/uL Final  11/01/2023 4.51 3.87 - 5.11 MIL/uL Final   Hemoglobin  Date Value Ref Range Status  01/23/2024 11.7 11.1 - 15.9 g/dL Final   Hematocrit  Date Value Ref Range Status  01/23/2024 37.1 34.0 - 46.6 % Final   MCHC  Date Value Ref Range Status  01/23/2024 31.5 31.5 - 35.7 g/dL Final  93/82/7974 68.2 30.0 - 36.0 g/dL Final   [facility]  Date Value Ref Range Status  01/23/2024 27.3  26.6 - 33.0 pg Final  11/01/2023 27.9 26.0 - 34.0 pg Final   MCV  Date Value Ref Range Status  01/23/2024 87 79 - 97 fL Final   No results found for: PLTCOUNTKUC, LABPLAT, POCPLA RDW  Date Value Ref Range Status  01/23/2024 13.3 11.7 - 15.4 % Final          FLUoxetine  (PROZAC ) 20 MG capsule 90 capsule 0    Sig: Take 1 capsule (20 mg total) by mouth daily.     Psychiatry:  Antidepressants - SSRI Passed - 06/20/2024 10:22 AM      Passed - Valid encounter within last 6 months    Recent Outpatient Visits           2 weeks ago Type 2 diabetes mellitus with other specified complication, without long-term current use of insulin  [facility])   [place] [facility], Raguel, MD   1 month ago Type 2 diabetes mellitus with other specified complication, with long-term current use of insulin  [facility])   [place] [facility] A Dept Of [place]. [facility] Fleeta Morris, [place] L, RPH-CPP    3 months ago Type 2 diabetes mellitus with other specified complication, with long-term current use of insulin  [facility])   [place] [facility] - A Dept Of [place]. [facility] Fleeta Morris, [place] L, RPH-CPP   3 months ago Type 2 diabetes mellitus with other specified complication, without long-term current use of insulin  [facility])   [place] [facility], MD   3 months ago Type 2 diabetes mellitus with other specified complication, with long-term current use of insulin  [facility])   [place] [facility] - A Dept Of [place]. [facility] Fleeta Morris, [place] L, RPH-CPP               losartan  (COZAAR ) 25 MG tablet 90 tablet 0    Sig: Take 1 tablet (25 mg total) by mouth daily.     Cardiovascular:  Angiotensin Receptor Blockers Failed - 06/20/2024 10:22 AM      Failed - Last BP in normal range    BP Readings from Last 1 Encounters:  06/06/24 (!) 153/68         Passed - Cr in normal range and within 180 days    Creatinine, Ser  Date Value Ref Range Status  01/23/2024 0.80 0.57 - 1.00 mg/dL Final         Passed - K in normal range and within 180 days    Potassium  Date Value Ref Range Status  01/23/2024 4.4 3.5 - 5.2 mmol/L Final         Passed - Patient is not pregnant      Passed - Valid encounter within last 6 months    Recent Outpatient Visits           2 weeks ago Type 2 diabetes mellitus with other specified complication, without long-term current use of insulin  [facility])   [place] [facility], Raguel, MD   1 month ago Type 2 diabetes mellitus with other specified complication, with long-term current use of insulin  [facility])   [place] [facility] Dept Of [place]. [facility] Fleeta Morris, [place] L, RPH-CPP   3 months ago Type 2 diabetes mellitus with other specified complication, with long-term current use of insulin  [facility])   [place] [facility] - A Dept Of [place]. [facility] Fleeta Morris, [place] L, RPH-CPP   3 months ago Type 2 diabetes mellitus with other specified complication, without long-term current use of insulin  [facility])   [place] [facility], MD   3 months ago Type 2 diabetes mellitus with other specified complication, with long-term current use of insulin  [facility])    [facility] - A Dept Of [place]. [facility] Fleeta Morris [place] LITTIE, RPH-CPP

## 2024-06-25 ENCOUNTER — Ambulatory Visit: Payer: Self-pay | Admitting: Pharmacist

## 2024-09-04 ENCOUNTER — Ambulatory Visit: Payer: Self-pay | Admitting: Family Medicine

## 2024-11-14 ENCOUNTER — Ambulatory Visit: Admitting: Family Medicine
